# Patient Record
Sex: Male | Born: 1965
Health system: Southern US, Community
[De-identification: ages and names within clinical notes are randomized; demographics above are authoritative.]

## PROBLEM LIST (undated history)

## (undated) DIAGNOSIS — M109 Gout, unspecified: Secondary | ICD-10-CM

## (undated) DIAGNOSIS — H209 Unspecified iridocyclitis: Secondary | ICD-10-CM

## (undated) DIAGNOSIS — N200 Calculus of kidney: Secondary | ICD-10-CM

## (undated) DIAGNOSIS — R1310 Dysphagia, unspecified: Secondary | ICD-10-CM

## (undated) HISTORY — PX: NECK SURGERY: SHX720

## (undated) HISTORY — PX: BACK SURGERY: SHX140

## (undated) HISTORY — DX: Unspecified iridocyclitis: H20.9

## (undated) HISTORY — PX: ROTATOR CUFF REPAIR: SHX139

## (undated) HISTORY — PX: OTHER SURGICAL HISTORY: SHX169

## (undated) HISTORY — DX: Calculus of kidney: N20.0

## (undated) HISTORY — DX: Gout, unspecified: M10.9

## (undated) HISTORY — DX: Dysphagia, unspecified: R13.10

---

## 2002-03-02 ENCOUNTER — Encounter: Payer: Self-pay | Admitting: Internal Medicine

## 2002-03-02 ENCOUNTER — Ambulatory Visit (HOSPITAL_COMMUNITY): Admission: RE | Admit: 2002-03-02 | Discharge: 2002-03-02 | Payer: Self-pay | Admitting: Internal Medicine

## 2002-04-13 ENCOUNTER — Encounter: Admission: RE | Admit: 2002-04-13 | Discharge: 2002-04-13 | Payer: Self-pay | Admitting: Neurosurgery

## 2002-04-13 ENCOUNTER — Encounter: Payer: Self-pay | Admitting: Neurosurgery

## 2002-04-27 ENCOUNTER — Encounter: Payer: Self-pay | Admitting: Neurosurgery

## 2002-04-27 ENCOUNTER — Encounter: Admission: RE | Admit: 2002-04-27 | Discharge: 2002-04-27 | Payer: Self-pay | Admitting: Neurosurgery

## 2002-07-12 ENCOUNTER — Ambulatory Visit (HOSPITAL_COMMUNITY): Admission: RE | Admit: 2002-07-12 | Discharge: 2002-07-12 | Payer: Self-pay | Admitting: Family Medicine

## 2002-07-12 ENCOUNTER — Encounter: Payer: Self-pay | Admitting: Family Medicine

## 2002-08-14 ENCOUNTER — Encounter (HOSPITAL_COMMUNITY): Admission: RE | Admit: 2002-08-14 | Discharge: 2002-09-13 | Payer: Self-pay | Admitting: Orthopedic Surgery

## 2003-02-05 ENCOUNTER — Encounter: Payer: Self-pay | Admitting: Family Medicine

## 2003-02-05 ENCOUNTER — Ambulatory Visit (HOSPITAL_COMMUNITY): Admission: RE | Admit: 2003-02-05 | Discharge: 2003-02-05 | Payer: Self-pay | Admitting: Family Medicine

## 2003-02-23 ENCOUNTER — Encounter: Payer: Self-pay | Admitting: *Deleted

## 2003-02-23 ENCOUNTER — Emergency Department (HOSPITAL_COMMUNITY): Admission: EM | Admit: 2003-02-23 | Discharge: 2003-02-23 | Payer: Self-pay | Admitting: *Deleted

## 2003-05-17 ENCOUNTER — Encounter: Payer: Self-pay | Admitting: *Deleted

## 2003-05-17 ENCOUNTER — Emergency Department (HOSPITAL_COMMUNITY): Admission: EM | Admit: 2003-05-17 | Discharge: 2003-05-17 | Payer: Self-pay | Admitting: *Deleted

## 2003-06-21 ENCOUNTER — Ambulatory Visit (HOSPITAL_COMMUNITY): Admission: RE | Admit: 2003-06-21 | Discharge: 2003-06-21 | Payer: Self-pay | Admitting: Internal Medicine

## 2003-06-21 ENCOUNTER — Encounter: Payer: Self-pay | Admitting: Internal Medicine

## 2004-01-24 ENCOUNTER — Ambulatory Visit (HOSPITAL_COMMUNITY): Admission: RE | Admit: 2004-01-24 | Discharge: 2004-01-24 | Payer: Self-pay | Admitting: Orthopedic Surgery

## 2005-11-08 ENCOUNTER — Emergency Department (HOSPITAL_COMMUNITY): Admission: EM | Admit: 2005-11-08 | Discharge: 2005-11-08 | Payer: Self-pay | Admitting: Emergency Medicine

## 2010-02-23 ENCOUNTER — Emergency Department (HOSPITAL_COMMUNITY): Admission: EM | Admit: 2010-02-23 | Discharge: 2010-02-23 | Payer: Self-pay | Admitting: Emergency Medicine

## 2010-04-13 ENCOUNTER — Emergency Department (HOSPITAL_COMMUNITY): Admission: EM | Admit: 2010-04-13 | Discharge: 2010-04-13 | Payer: Self-pay | Admitting: Emergency Medicine

## 2010-11-19 ENCOUNTER — Emergency Department (HOSPITAL_COMMUNITY)
Admission: EM | Admit: 2010-11-19 | Discharge: 2010-11-19 | Payer: Self-pay | Source: Home / Self Care | Admitting: Emergency Medicine

## 2011-02-03 LAB — URINALYSIS, ROUTINE W REFLEX MICROSCOPIC
Bilirubin Urine: NEGATIVE
Ketones, ur: NEGATIVE mg/dL
Nitrite: NEGATIVE
Protein, ur: NEGATIVE mg/dL
Specific Gravity, Urine: 1.02 (ref 1.005–1.030)
Urobilinogen, UA: 0.2 mg/dL (ref 0.0–1.0)

## 2011-02-03 LAB — URINE MICROSCOPIC-ADD ON

## 2012-12-15 ENCOUNTER — Ambulatory Visit (HOSPITAL_COMMUNITY)
Admission: RE | Admit: 2012-12-15 | Discharge: 2012-12-15 | Disposition: A | Discharge status: Home / Self Care | Payer: 59 | Source: Ambulatory Visit | Attending: Family Medicine | Admitting: Family Medicine

## 2012-12-15 ENCOUNTER — Other Ambulatory Visit (HOSPITAL_COMMUNITY): Payer: Self-pay | Admitting: Family Medicine

## 2012-12-15 DIAGNOSIS — R109 Unspecified abdominal pain: Secondary | ICD-10-CM

## 2012-12-15 DIAGNOSIS — K7689 Other specified diseases of liver: Secondary | ICD-10-CM | POA: Insufficient documentation

## 2012-12-15 MED ORDER — IOHEXOL 300 MG/ML  SOLN
100.0000 mL | Freq: Once | INTRAMUSCULAR | Status: AC | PRN
  Administered 2012-12-15: 100 mL via INTRAVENOUS

## 2013-02-09 ENCOUNTER — Encounter (INDEPENDENT_AMBULATORY_CARE_PROVIDER_SITE_OTHER): Payer: Self-pay | Admitting: *Deleted

## 2013-02-20 ENCOUNTER — Encounter (HOSPITAL_COMMUNITY): Payer: Self-pay | Admitting: Pharmacy Technician

## 2013-02-20 ENCOUNTER — Ambulatory Visit (INDEPENDENT_AMBULATORY_CARE_PROVIDER_SITE_OTHER): Payer: BC Managed Care – PPO | Admitting: Internal Medicine

## 2013-02-20 ENCOUNTER — Encounter (INDEPENDENT_AMBULATORY_CARE_PROVIDER_SITE_OTHER): Payer: Self-pay | Admitting: *Deleted

## 2013-02-20 ENCOUNTER — Telehealth (INDEPENDENT_AMBULATORY_CARE_PROVIDER_SITE_OTHER): Payer: Self-pay | Admitting: *Deleted

## 2013-02-20 ENCOUNTER — Encounter (INDEPENDENT_AMBULATORY_CARE_PROVIDER_SITE_OTHER): Payer: Self-pay | Admitting: Internal Medicine

## 2013-02-20 ENCOUNTER — Other Ambulatory Visit (INDEPENDENT_AMBULATORY_CARE_PROVIDER_SITE_OTHER): Payer: Self-pay | Admitting: *Deleted

## 2013-02-20 VITALS — BP 98/66 | HR 72 | Ht 72.0 in | Wt 236.9 lb

## 2013-02-20 DIAGNOSIS — R109 Unspecified abdominal pain: Secondary | ICD-10-CM

## 2013-02-20 DIAGNOSIS — K5900 Constipation, unspecified: Secondary | ICD-10-CM

## 2013-02-20 DIAGNOSIS — Z1211 Encounter for screening for malignant neoplasm of colon: Secondary | ICD-10-CM

## 2013-02-20 NOTE — Progress Notes (Signed)
Subjective:     Patient ID: Ryan Fuller, male   DOB: 09-05-1966, 47 y.o.   MRN: [id number]  HPI Referred to our office by Dr. Phillips Odor for constipation/colonoscopy. He tells me has had constipation all his life. He also had pain left lower quadrant. Pain x 3 weeks. He has seen a small amount of blood when he wipes x 1. Appetite is good. No weight loss. BMs are brown or green. Normal size. He tells me he is satisfied using the Miralax and stool softners to have a BM.  He had a colonoscopy yrs ago for same at around age 11 for constipation.  12/15/2012 CT abdomen and pelvis with CM: IMPRESSION:  1. No acute inflammatory process within abdomen or pelvis.  2. Mild hepatic fatty infiltration.  3. No hydronephrosis or hydroureter.  4. Moderate colonic stool.  5. There is a low-lying cecum. No pericecal inflammation.  Significant stool noted within cecum.  6. Normal appendix.  Review of Systems Current Outpatient Prescriptions  Medication Sig Dispense Refill  . Casanthranol-Docusate Sodium 30-100 MG CAPS Take by mouth.      Marland Kitchen omeprazole (PRILOSEC) 20 MG capsule Take 20 mg by mouth daily.      . polyethylene glycol (MIRALAX / GLYCOLAX) packet Take 17 g by mouth daily.       No current facility-administered medications for this visit.   Past Surgical History  Procedure Laterality Date  . Rotator cuff surgery    . Egd/ed      years ago     Allergies  Allergen Reactions  . Allopurinol   . Colchicine   . Phenergan (Promethazine Hcl)        Objective:   Physical Exam  Filed Vitals:   02/20/13 1102  BP: 98/66  Pulse: 72  Height: 6' (1.829 m)  Weight: 236 lb 14.4 oz (107.457 kg)   Alert and oriented. Skin warm and dry. Oral mucosa is moist.   . Sclera anicteric, conjunctivae is pink. Thyroid not enlarged. No cervical lymphadenopathy. Lungs clear. Heart regular rate and rhythm.  Abdomen is soft. Bowel sounds are positive. No hepatomegaly. No abdominal masses felt. No tenderness.   No edema to lower extremities.       Assessment:     Constipation./ Left sided abdominal pain.   Ct scan did not reveal any masses.    Plan:    Colonoscopy with Dr. Karilyn Cota.

## 2013-02-20 NOTE — Patient Instructions (Addendum)
Colonoscopy with Dr. Rehman 

## 2013-02-20 NOTE — Telephone Encounter (Signed)
Patient needs movi prep 

## 2013-02-21 DIAGNOSIS — K5900 Constipation, unspecified: Secondary | ICD-10-CM | POA: Insufficient documentation

## 2013-02-21 MED ORDER — PEG-KCL-NACL-NASULF-NA ASC-C 100 G PO SOLR: 1.0000 | Freq: Once | ORAL | Status: DC

## 2013-03-01 ENCOUNTER — Ambulatory Visit (HOSPITAL_COMMUNITY)
Admission: RE | Admit: 2013-03-01 | Discharge: 2013-03-01 | Disposition: A | Discharge status: Home / Self Care | Payer: BC Managed Care – PPO | Source: Ambulatory Visit | Attending: Internal Medicine | Admitting: Internal Medicine

## 2013-03-01 ENCOUNTER — Encounter (HOSPITAL_COMMUNITY)
Admission: RE | Disposition: A | Discharge status: Home / Self Care | Payer: Self-pay | Source: Ambulatory Visit | Attending: Internal Medicine

## 2013-03-01 DIAGNOSIS — M109 Gout, unspecified: Secondary | ICD-10-CM | POA: Insufficient documentation

## 2013-03-01 DIAGNOSIS — R109 Unspecified abdominal pain: Secondary | ICD-10-CM

## 2013-03-01 DIAGNOSIS — K644 Residual hemorrhoidal skin tags: Secondary | ICD-10-CM | POA: Insufficient documentation

## 2013-03-01 DIAGNOSIS — D126 Benign neoplasm of colon, unspecified: Secondary | ICD-10-CM

## 2013-03-01 DIAGNOSIS — Z79899 Other long term (current) drug therapy: Secondary | ICD-10-CM | POA: Insufficient documentation

## 2013-03-01 DIAGNOSIS — K5730 Diverticulosis of large intestine without perforation or abscess without bleeding: Secondary | ICD-10-CM | POA: Insufficient documentation

## 2013-03-01 DIAGNOSIS — K921 Melena: Secondary | ICD-10-CM

## 2013-03-01 DIAGNOSIS — Z888 Allergy status to other drugs, medicaments and biological substances status: Secondary | ICD-10-CM | POA: Insufficient documentation

## 2013-03-01 DIAGNOSIS — K5900 Constipation, unspecified: Secondary | ICD-10-CM

## 2013-03-01 HISTORY — PX: COLONOSCOPY: SHX5424

## 2013-03-01 SURGERY — COLONOSCOPY
Anesthesia: Moderate Sedation

## 2013-03-01 MED ORDER — HYOSCYAMINE SULFATE 0.125 MG SL SUBL: 0.1250 mg | SUBLINGUAL_TABLET | Freq: Four times a day (QID) | SUBLINGUAL | Status: DC | PRN

## 2013-03-01 MED ORDER — MIDAZOLAM HCL 5 MG/5ML IJ SOLN
INTRAMUSCULAR | Status: AC
  Filled 2013-03-01: qty 10

## 2013-03-01 MED ORDER — MEPERIDINE HCL 50 MG/ML IJ SOLN
INTRAMUSCULAR | Status: DC | PRN
  Administered 2013-03-01 (×2): 25 mg via INTRAVENOUS

## 2013-03-01 MED ORDER — MEPERIDINE HCL 50 MG/ML IJ SOLN
INTRAMUSCULAR | Status: AC
  Filled 2013-03-01: qty 1

## 2013-03-01 MED ORDER — SODIUM CHLORIDE 0.9 % IV SOLN
INTRAVENOUS | Status: DC
  Administered 2013-03-01: 14:00:00 via INTRAVENOUS

## 2013-03-01 MED ORDER — PSYLLIUM 28 % PO PACK: 1.0000 | PACK | Freq: Every day | ORAL | Status: DC

## 2013-03-01 MED ORDER — MIDAZOLAM HCL 5 MG/5ML IJ SOLN
INTRAMUSCULAR | Status: DC | PRN
  Administered 2013-03-01: 1 mg via INTRAVENOUS
  Administered 2013-03-01 (×2): 2 mg via INTRAVENOUS
  Administered 2013-03-01 (×2): 1 mg via INTRAVENOUS
  Administered 2013-03-01: 2 mg via INTRAVENOUS

## 2013-03-01 NOTE — H&P (Signed)
Brogan V Franko is an 47 y.o. male.   Chief Complaint: Patient is here for colonoscopy. HPI: Patient is 47 year old Caucasian male who presents with intermittent left-sided abdominal pain and worsening constipation. He has occasional hematochezia. He has good appetite. He denies weight loss. He has been pain free for the last few history. He underwent abdominopelvic CT on 12/15/2012 revealing significant amount of stool in his colon but no other abnormalities were present. Family history is negative for colorectal carcinoma.  Past Medical History  Diagnosis Date  . Gout   . Kidney stones     Past Surgical History  Procedure Laterality Date  . Rotator cuff surgery    . Egd/ed      years ago    No family history on file. Social History:  reports that he has never smoked. He does not have any smokeless tobacco history on file. He reports that he does not drink alcohol or use illicit drugs.  Allergies:  Allergies  Allergen Reactions  . Allopurinol   . Colchicine   . Phenergan (Promethazine Hcl)     Medications Prior to Admission  Medication Sig Dispense Refill  . Casanthranol-Docusate Sodium 30-100 MG CAPS Take by mouth.      Marland Kitchen omeprazole (PRILOSEC) 20 MG capsule Take 20 mg by mouth daily.      . peg 3350 powder (MOVIPREP) 100 G SOLR Take 1 kit (100 g total) by mouth once.  1 kit  0  . polyethylene glycol (MIRALAX / GLYCOLAX) packet Take 17 g by mouth daily.        No results found for this or any previous visit (from the past 48 hour(s)). No results found.  ROS  Temperature 98.1 F (36.7 C), temperature source Oral, height 6' (1.829 m), weight 235 lb (106.595 kg). Physical Exam  Constitutional: He appears well-developed and well-nourished.  HENT:  Mouth/Throat: Oropharynx is clear and moist.  Eyes: Conjunctivae are normal. No scleral icterus.  Neck: No thyromegaly present.  Cardiovascular: Normal rate, regular rhythm and normal heart sounds.   No murmur  heard. Respiratory: Effort normal and breath sounds normal.  GI: Soft. He exhibits no distension and no mass. There is no tenderness.  Musculoskeletal: He exhibits no edema.  Lymphadenopathy:    He has no cervical adenopathy.  Neurological: He is alert.  Skin: Skin is warm and dry.     Assessment/Plan Constipation and left-sided abdominal pain. Diagnostic colonoscopy.  REHMAN,NAJEEB U 03/01/2013, 3:42 PM

## 2013-03-01 NOTE — Op Note (Addendum)
COLONOSCOPY PROCEDURE REPORT  PATIENT:  Ryan Fuller  MR#:  [id number] Birthdate:  15-Jul-1966, 47 y.o., male Endoscopist:  Dr. Malissa Hippo, MD Referred By:  Dr. Colette Ribas, MD  Procedure Date: 03/01/2013  Procedure:   Colonoscopy  Indications:  Patient is 47 year old Caucasian with persistent left-sided abdominal pain. Recent abdominopelvic CT was unremarkable other than significant amount of stool in his colon. He has occasional hematochezia felt to be secondary to hemorrhoids.  Informed Consent:  The procedure and risks were reviewed with the patient and informed consent was obtained.  Medications:  Demerol 50 mg IV Versed 10 mg IV  Description of procedure:  After a digital rectal exam was performed, that colonoscope was advanced from the anus through the rectum and colon to the area of the cecum, ileocecal valve and appendiceal orifice. The cecum was deeply intubated. These structures were well-seen and photographed for the record. From the level of the cecum and ileocecal valve, the scope was slowly and cautiously withdrawn. The mucosal surfaces were carefully surveyed utilizing scope tip to flexion to facilitate fold flattening as needed. The scope was pulled down into the rectum where a thorough exam including retroflexion was performed. Terminal ileum was also examined.  Findings:   Prep excellent. Normal mucosa of terminal ileum. Small polyp ablated via cold biopsy from transverse colon. Another small polyp ablated via cold biopsy from distal sigmoid colon. Both of these polyps were submitted together. Scattered diverticula at sigmoid and descending colon. Small external hemorrhoids. .   Therapeutic/Diagnostic Maneuvers Performed:  See above  Complications:  None  Cecal Withdrawal Time:  16 minutes  Impression:  Normal mucosa of terminal ileum. Mild left-sided diverticulosis. 2 small polyps ablated via cold biopsy and submitted together(transverse and  sigmoid colon). Small external hemorrhoids.  Recommendations:  Standard instructions given. Metamucil 4 g by mouth each bedtime. Discontinue casanthranol/docusate. Hyoscyamine sublingual 1 tablet 4 times a day when necessary. I will contact patient with biopsy results and further recommendations. Office visit in one month.   Shone Leventhal U  03/01/2013 4:23 PM  CC: Dr. Phillips Odor, Chancy Hurter, MD & Dr. Bonnetta Barry ref. provider found

## 2013-03-05 ENCOUNTER — Encounter (HOSPITAL_COMMUNITY): Payer: Self-pay | Admitting: Internal Medicine

## 2013-03-14 ENCOUNTER — Encounter (INDEPENDENT_AMBULATORY_CARE_PROVIDER_SITE_OTHER): Payer: Self-pay | Admitting: *Deleted

## 2013-03-20 NOTE — Progress Notes (Signed)
Apt has been scheduled for 04/23/13 with Dr. Rehman 

## 2013-04-23 ENCOUNTER — Ambulatory Visit (INDEPENDENT_AMBULATORY_CARE_PROVIDER_SITE_OTHER): Payer: BC Managed Care – PPO | Admitting: Internal Medicine

## 2013-06-26 ENCOUNTER — Ambulatory Visit (INDEPENDENT_AMBULATORY_CARE_PROVIDER_SITE_OTHER): Payer: BC Managed Care – PPO | Admitting: Internal Medicine

## 2014-06-12 ENCOUNTER — Other Ambulatory Visit (HOSPITAL_COMMUNITY): Payer: Self-pay | Admitting: Physician Assistant

## 2014-06-12 DIAGNOSIS — S335XXA Sprain of ligaments of lumbar spine, initial encounter: Secondary | ICD-10-CM

## 2014-06-14 ENCOUNTER — Ambulatory Visit (HOSPITAL_COMMUNITY)
Admission: RE | Admit: 2014-06-14 | Discharge: 2014-06-14 | Disposition: A | Discharge status: Home / Self Care | Payer: BC Managed Care – PPO | Source: Ambulatory Visit | Attending: Physician Assistant | Admitting: Physician Assistant

## 2014-06-14 DIAGNOSIS — S335XXA Sprain of ligaments of lumbar spine, initial encounter: Secondary | ICD-10-CM

## 2014-06-14 DIAGNOSIS — Y999 Unspecified external cause status: Secondary | ICD-10-CM | POA: Insufficient documentation

## 2014-06-14 DIAGNOSIS — X58XXXA Exposure to other specified factors, initial encounter: Secondary | ICD-10-CM | POA: Insufficient documentation

## 2014-06-14 DIAGNOSIS — Y929 Unspecified place or not applicable: Secondary | ICD-10-CM | POA: Insufficient documentation

## 2014-09-24 ENCOUNTER — Other Ambulatory Visit: Payer: Self-pay | Admitting: Neurosurgery

## 2014-09-24 DIAGNOSIS — M4646 Discitis, unspecified, lumbar region: Secondary | ICD-10-CM

## 2014-09-24 DIAGNOSIS — M5126 Other intervertebral disc displacement, lumbar region: Secondary | ICD-10-CM

## 2014-09-25 ENCOUNTER — Other Ambulatory Visit: Payer: Self-pay | Admitting: Neurosurgery

## 2014-09-25 ENCOUNTER — Ambulatory Visit
Admission: RE | Admit: 2014-09-25 | Discharge: 2014-09-25 | Disposition: A | Discharge status: Home / Self Care | Payer: BC Managed Care – PPO | Source: Ambulatory Visit | Attending: Neurosurgery | Admitting: Neurosurgery

## 2014-09-25 DIAGNOSIS — M5126 Other intervertebral disc displacement, lumbar region: Secondary | ICD-10-CM

## 2014-09-25 DIAGNOSIS — M4646 Discitis, unspecified, lumbar region: Secondary | ICD-10-CM

## 2014-09-25 LAB — CSF CULTURE W GRAM STAIN

## 2014-09-25 LAB — CSF CULTURE

## 2014-09-25 MED ORDER — MIDAZOLAM HCL 2 MG/2ML IJ SOLN
1.0000 mg | INTRAMUSCULAR | Status: DC | PRN
  Administered 2014-09-25: 2 mg via INTRAVENOUS
  Administered 2014-09-25 (×2): 1 mg via INTRAVENOUS

## 2014-09-25 MED ORDER — KETOROLAC TROMETHAMINE 30 MG/ML IJ SOLN
30.0000 mg | Freq: Once | INTRAMUSCULAR | Status: AC
  Administered 2014-09-25: 30 mg via INTRAVENOUS

## 2014-09-25 MED ORDER — FENTANYL CITRATE 0.05 MG/ML IJ SOLN
25.0000 ug | INTRAMUSCULAR | Status: DC | PRN
  Administered 2014-09-25 (×3): 50 ug via INTRAVENOUS
  Administered 2014-09-25: 25 ug via INTRAVENOUS

## 2014-09-25 MED ORDER — SODIUM CHLORIDE 0.9 % IV SOLN
Freq: Once | INTRAVENOUS | Status: AC
  Administered 2014-09-25: 08:00:00 via INTRAVENOUS

## 2014-09-25 NOTE — Discharge Instructions (Signed)
Disc Aspiration Post Procedure Discharge Instructions ° °1. May resume a regular diet and any medications that you routinely take (including pain medications). °2. No driving day of procedure. °3. Upon discharge go home and rest for at least 4 hours.  May use an ice pack as needed to injection sites on back. °4. Remove bandades later, today. ° ° ° °Please contact our office at [phone] for the following symptoms: ° °· Fever greater than 100 degrees °· Increased swelling, pain, or redness at injection site. ° ° °Thank you for visiting [facility]. ° °  ° ° ° °    °

## 2014-09-26 ENCOUNTER — Ambulatory Visit (HOSPITAL_COMMUNITY)
Admission: RE | Admit: 2014-09-26 | Discharge: 2014-09-26 | Disposition: A | Discharge status: Home / Self Care | Payer: BC Managed Care – PPO | Source: Ambulatory Visit | Attending: Neurosurgery | Admitting: Neurosurgery

## 2014-09-26 DIAGNOSIS — M4640 Discitis, unspecified, site unspecified: Secondary | ICD-10-CM | POA: Insufficient documentation

## 2014-09-26 MED ORDER — SODIUM CHLORIDE 0.9 % IJ SOLN: 10.0000 mL | Freq: Two times a day (BID) | INTRAMUSCULAR | Status: DC

## 2014-09-26 MED ORDER — SODIUM CHLORIDE 0.9 % IJ SOLN: 10.0000 mL | INTRAMUSCULAR | Status: DC | PRN

## 2014-09-26 NOTE — Discharge Instructions (Signed)
PICC Home Guide A peripherally inserted central catheter (PICC) is a long, thin, flexible tube that is inserted into a vein in the upper arm. It is a form of intravenous (IV) access. It is considered to be a "central" line because the tip of the PICC ends in a large vein in your chest. This large vein is called the superior vena cava (SVC). The PICC tip ends in the SVC because there is a lot of blood flow in the SVC. This allows medicines and IV fluids to be quickly distributed throughout the body. The PICC is inserted using a sterile technique by a specially trained nurse or physician. After the PICC is inserted, a chest X-ray exam is done to be sure it is in the correct place.  A PICC may be placed for different reasons, such as:  To give medicines and liquid nutrition that can only be given through a central line. Examples are:  Certain antibiotic treatments.  Chemotherapy.  Total parenteral nutrition (TPN).  To take frequent blood samples.  To give IV fluids and blood products.  If there is difficulty placing a peripheral intravenous (PIV) catheter. If taken care of properly, a PICC can remain in place for several months. A PICC can also allow a person to go home from the hospital early. Medicine and PICC care can be managed at home by a family member or home health care team. WHAT PROBLEMS CAN HAPPEN WHEN I HAVE A PICC? Problems with a PICC can occasionally occur. These may include the following:  A blood clot (thrombus) forming in or at the tip of the PICC. This can cause the PICC to become clogged. A clot-dissolving medicine called tissue plasminogen activator (tPA) can be given through the PICC to help break up the clot.  Inflammation of the vein (phlebitis) in which the PICC is placed. Signs of inflammation may include redness, pain at the insertion site, red streaks, or being able to feel a "cord" in the vein where the PICC is located.  Infection in the PICC or at the insertion  site. Signs of infection may include fever, chills, redness, swelling, or pus drainage from the PICC insertion site.  PICC movement (malposition). The PICC tip may move from its original position due to excessive physical activity, forceful coughing, sneezing, or vomiting.  A break or cut in the PICC. It is important to not use scissors near the PICC.  Nerve or tendon irritation or injury during PICC insertion. WHAT SHOULD I KEEP IN MIND ABOUT ACTIVITIES WHEN I HAVE A PICC?  You may bend your arm and move it freely. If your PICC is near or at the bend of your elbow, avoid activity with repeated motion at the elbow.  Rest at home for the remainder of the day following PICC line insertion.  Avoid lifting heavy objects as instructed by your health care provider.  Avoid using a crutch with the arm on the same side as your PICC. You may need to use a walker. WHAT SHOULD I KNOW ABOUT MY PICC DRESSING?  Keep your PICC bandage (dressing) clean and dry to prevent infection.  Ask your health care provider when you may shower. Ask your health care provider to teach you how to wrap the PICC when you do take a shower.  Change the PICC dressing as instructed by your health care provider.  Change your PICC dressing if it becomes loose or wet. WHAT SHOULD I KNOW ABOUT PICC CARE?  Check the PICC insertion site   daily for leakage, redness, swelling, or pain.  Do not take a bath, swim, or use hot tubs when you have a PICC. Cover PICC line with clear plastic wrap and tape to keep it dry while showering.  Flush the PICC as directed by your health care provider. Let your health care provider know right away if the PICC is difficult to flush or does not flush. Do not use force to flush the PICC.  Do not use a syringe that is less than 10 mL to flush the PICC.  Never pull or tug on the PICC.  Avoid blood pressure checks on the arm with the PICC.  Keep your PICC identification card with you at all  times.  Do not take the PICC out yourself. Only a trained clinical professional should remove the PICC. SEEK IMMEDIATE MEDICAL CARE IF:  Your PICC is accidentally pulled all the way out. If this happens, cover the insertion site with a bandage or gauze dressing. Do not throw the PICC away. Your health care provider will need to inspect it.  Your PICC was tugged or pulled and has partially come out. Do not  push the PICC back in.  There is any type of drainage, redness, or swelling where the PICC enters the skin.  You cannot flush the PICC, it is difficult to flush, or the PICC leaks around the insertion site when it is flushed.  You hear a "flushing" sound when the PICC is flushed.  You have pain, discomfort, or numbness in your arm, shoulder, or jaw on the same side as the PICC.  You feel your heart "racing" or skipping beats.  You notice a hole or tear in the PICC.  You develop chills or a fever. MAKE SURE YOU:   Understand these instructions.  Will watch your condition.  Will get help right away if you are not doing well or get worse. Document Released: 05/08/2003 Document Revised: 03/18/2014 Document Reviewed: 07/09/2013 Exit[employer] Patient Information 2015 [employer]. This information is not intended to replace advice given to you by your health care provider. Make sure you discuss any questions you have with your health care provider.  

## 2014-09-26 NOTE — Progress Notes (Signed)
Peripherally Inserted Central Catheter/Midline Placement  The IV Nurse has discussed with the patient and/or persons authorized to consent for the patient, the purpose of this procedure and the potential benefits and risks involved with this procedure.  The benefits include less needle sticks, lab draws from the catheter and patient may be discharged home with the catheter.  Risks include, but not limited to, infection, bleeding, blood clot (thrombus formation), and puncture of an artery; nerve damage and irregular heat beat.  Alternatives to this procedure were also discussed.  PICC/Midline Placement Documentation  PICC / Midline Single Lumen 09/26/14 PICC Right Brachial 43 cm 2 cm (Active)  Indication for Insertion or Continuance of Line Prolonged intravenous therapies;Home intravenous therapies (PICC only) 09/26/2014 11:30 AM  Exposed Catheter (cm) 2 cm 09/26/2014 11:30 AM  Site Assessment Clean;Dry;Intact 09/26/2014 11:30 AM  Line Status Flushed;Saline locked;Capped (central line);Blood return noted 09/26/2014 11:30 AM  Dressing Type Transparent 09/26/2014 11:30 AM  Dressing Status Clean;Dry;Intact 09/26/2014 11:30 AM    Ok to use PICC line. ECG technology used verifying placement. Copy of ECG being sent with patient.  Roselind Messier 09/26/2014, 11:44 AM

## 2015-12-16 ENCOUNTER — Other Ambulatory Visit: Payer: Self-pay | Admitting: Neurosurgery

## 2015-12-16 DIAGNOSIS — M5126 Other intervertebral disc displacement, lumbar region: Secondary | ICD-10-CM

## 2015-12-17 ENCOUNTER — Emergency Department (HOSPITAL_COMMUNITY)
Admission: EM | Admit: 2015-12-17 | Discharge: 2015-12-17 | Disposition: A | Discharge status: Home / Self Care | Payer: BLUE CROSS/BLUE SHIELD | Attending: Emergency Medicine | Admitting: Emergency Medicine

## 2015-12-17 ENCOUNTER — Encounter (HOSPITAL_COMMUNITY): Payer: Self-pay | Admitting: Emergency Medicine

## 2015-12-17 DIAGNOSIS — R197 Diarrhea, unspecified: Secondary | ICD-10-CM | POA: Insufficient documentation

## 2015-12-17 DIAGNOSIS — R112 Nausea with vomiting, unspecified: Secondary | ICD-10-CM | POA: Insufficient documentation

## 2015-12-17 DIAGNOSIS — Z880 Allergy status to penicillin: Secondary | ICD-10-CM | POA: Diagnosis not present

## 2015-12-17 DIAGNOSIS — F17210 Nicotine dependence, cigarettes, uncomplicated: Secondary | ICD-10-CM | POA: Insufficient documentation

## 2015-12-17 DIAGNOSIS — Z87442 Personal history of urinary calculi: Secondary | ICD-10-CM | POA: Diagnosis not present

## 2015-12-17 DIAGNOSIS — Z8739 Personal history of other diseases of the musculoskeletal system and connective tissue: Secondary | ICD-10-CM | POA: Diagnosis not present

## 2015-12-17 LAB — URINALYSIS, ROUTINE W REFLEX MICROSCOPIC
BILIRUBIN URINE: NEGATIVE
Glucose, UA: NEGATIVE mg/dL
Hgb urine dipstick: NEGATIVE
KETONES UR: NEGATIVE mg/dL
LEUKOCYTES UA: NEGATIVE
NITRITE: NEGATIVE
Protein, ur: 30 mg/dL — AB
SPECIFIC GRAVITY, URINE: 1.025 (ref 1.005–1.030)
pH: 5.5 (ref 5.0–8.0)

## 2015-12-17 LAB — COMPREHENSIVE METABOLIC PANEL
ALBUMIN: 4.1 g/dL (ref 3.5–5.0)
ALT: 25 U/L (ref 17–63)
ANION GAP: 11 (ref 5–15)
AST: 27 U/L (ref 15–41)
Alkaline Phosphatase: 67 U/L (ref 38–126)
BILIRUBIN TOTAL: 1.2 mg/dL (ref 0.3–1.2)
BUN: 14 mg/dL (ref 6–20)
CALCIUM: 9 mg/dL (ref 8.9–10.3)
CHLORIDE: 102 mmol/L (ref 101–111)
CO2: 23 mmol/L (ref 22–32)
Creatinine, Ser: 0.97 mg/dL (ref 0.61–1.24)
GFR calc Af Amer: >60 mL/min (ref >60)
GFR calc non Af Amer: >60 mL/min (ref >60)
GLUCOSE: 138 mg/dL — AB (ref 65–99)
POTASSIUM: 3.5 mmol/L (ref 3.5–5.1)
SODIUM: 136 mmol/L (ref 135–145)
TOTAL PROTEIN: 7.7 g/dL (ref 6.5–8.1)

## 2015-12-17 LAB — CBC
HCT: 43.7 % (ref 39.0–52.0)
HEMOGLOBIN: 15.3 g/dL (ref 13.0–17.0)
MCH: 29.9 pg (ref 26.0–34.0)
MCHC: 35 g/dL (ref 30.0–36.0)
MCV: 85.5 fL (ref 78.0–100.0)
Platelets: 127 10*3/uL — ABNORMAL LOW (ref 150–400)
RBC: 5.11 MIL/uL (ref 4.22–5.81)
RDW: 14 % (ref 11.5–15.5)
WBC: 5.5 10*3/uL (ref 4.0–10.5)

## 2015-12-17 LAB — LIPASE, BLOOD: LIPASE: 17 U/L (ref 11–51)

## 2015-12-17 LAB — URINE MICROSCOPIC-ADD ON

## 2015-12-17 MED ORDER — ONDANSETRON 4 MG PO TBDP: 4.0000 mg | ORAL_TABLET | Freq: Three times a day (TID) | ORAL | Status: DC | PRN

## 2015-12-17 MED ORDER — ONDANSETRON HCL 4 MG/2ML IJ SOLN
4.0000 mg | Freq: Once | INTRAMUSCULAR | Status: AC
  Administered 2015-12-17: 4 mg via INTRAMUSCULAR
  Filled 2015-12-17: qty 2

## 2015-12-17 MED ORDER — SODIUM CHLORIDE 0.9 % IV SOLN
Freq: Once | INTRAVENOUS | Status: AC
  Administered 2015-12-17: 09:00:00 via INTRAVENOUS

## 2015-12-17 MED ORDER — SODIUM CHLORIDE 0.9 % IV BOLUS (SEPSIS)
1000.0000 mL | Freq: Once | INTRAVENOUS | Status: AC
  Administered 2015-12-17: 1000 mL via INTRAVENOUS

## 2015-12-17 MED ORDER — ONDANSETRON HCL 4 MG/2ML IJ SOLN
4.0000 mg | INTRAMUSCULAR | Status: AC
  Administered 2015-12-17: 4 mg via INTRAVENOUS
  Filled 2015-12-17: qty 2

## 2015-12-17 MED ORDER — METOCLOPRAMIDE HCL 5 MG/ML IJ SOLN
10.0000 mg | INTRAMUSCULAR | Status: AC
  Administered 2015-12-17: 10 mg via INTRAVENOUS
  Filled 2015-12-17: qty 2

## 2015-12-17 NOTE — ED Notes (Signed)
Attempted orthostatic vs. Pt increasing n/v upon sitting.

## 2015-12-17 NOTE — ED Notes (Signed)
Pt reports n/v/d and fever since Sunday night. Pt reports he is unable to tolerate any thing PO.

## 2015-12-17 NOTE — Discharge Instructions (Signed)
Take the prescribed medication as directed.  You may continue taking Imodium if needed for diarrhea. Recommend to start with gentle diet and progress back to normal as tolerated. Use caution when eating meat or dairy products as these tend to sit heavily on the stomach and may worsen her symptoms. Follow-up with your primary care physician as needed. Return to the ED for new or worsening symptoms.

## 2015-12-17 NOTE — ED Provider Notes (Signed)
CSN: [id number]     Arrival date & time 12/17/15  [id number] History   First MD Initiated Contact with Patient 12/17/15 0840     Chief Complaint  Patient presents with  . Emesis  . Diarrhea     (Consider location/radiation/quality/duration/timing/severity/associated sxs/prior Treatment) Patient is a 50 y.o. male presenting with vomiting and diarrhea. The history is provided by the patient and medical records.  Emesis Associated symptoms: diarrhea   Diarrhea Associated symptoms: vomiting     50 year old male with history of gout and kidney stones, presenting to the ED for nausea, vomiting, and diarrhea since Sunday evening. His wife has recently been sick with similar symptoms, most of hers resolved by yesterday afternoon however patient's symptoms worsened. He states he been able to tolerate very little food or fluids over the past few days. He states he has had a low-grade fever at home. He has been taking Motrin and Imodium without significant relief of symptoms.  Patient denies any abdominal pain. No chest pain or shortness of breath. No hx of underlying GI issues aside form some intermittent issues with constipation-- takes metamucil for this which seems to regulate bowel movements.  Vital signs stable.  Past Medical History  Diagnosis Date  . Gout   . Kidney stones    Past Surgical History  Procedure Laterality Date  . Rotator cuff surgery    . Egd/ed      years ago  . Colonoscopy N/A 03/01/2013    Procedure: COLONOSCOPY;  Surgeon: Rogene Houston, MD;  Location: [facility];  Service: Endoscopy;  Laterality: N/A;  100  . Back surgery     No family history on file. Social History  Substance Use Topics  . Smoking status: Never Smoker   . Smokeless tobacco: None  . Alcohol Use: No    Review of Systems  Gastrointestinal: Positive for nausea, vomiting and diarrhea.  All other systems reviewed and are negative.     Allergies  Phenergan; Allopurinol; Penicillins; and  Colchicine  Home Medications   Prior to Admission medications   Medication Sig Start Date End Date Taking? Authorizing Provider  hyoscyamine (LEVSIN/SL) 0.125 MG SL tablet Place 1 tablet (0.125 mg total) under the tongue every 6 (six) hours as needed for cramping. 03/01/13   Rogene Houston, MD  omeprazole (PRILOSEC) 20 MG capsule Take 20 mg by mouth daily.    Historical Provider, MD  polyethylene glycol (MIRALAX / GLYCOLAX) packet Take 17 g by mouth daily.    Historical Provider, MD  psyllium (METAMUCIL SMOOTH TEXTURE) 28 % packet Take 1 packet by mouth at bedtime. 03/01/13   Rogene Houston, MD   BP 113/79 mmHg  Pulse 83  Temp(Src) 97.6 F (36.4 C) (Oral)  Resp 16  Ht 6' (1.829 m)  Wt 104.327 kg  BMI 31.19 kg/m2  SpO2 96%   Physical Exam  Constitutional: He is oriented to person, place, and time. He appears well-developed and well-nourished. No distress.  HENT:  Head: Normocephalic and atraumatic.  Mouth/Throat: Uvula is midline and oropharynx is clear and moist. Mucous membranes are dry. No oropharyngeal exudate, posterior oropharyngeal edema, posterior oropharyngeal erythema or tonsillar abscesses.  Dry mucous membranes  Eyes: Conjunctivae and EOM are normal. Pupils are equal, round, and reactive to light.  Neck: Normal range of motion. Neck supple.  Cardiovascular: Normal rate, regular rhythm and normal heart sounds.   Pulmonary/Chest: Effort normal and breath sounds normal. No respiratory distress. He has no wheezes.  Abdominal: Soft.  Bowel sounds are normal. There is no tenderness. There is no guarding.  Musculoskeletal: Normal range of motion. He exhibits no edema.  Neurological: He is alert and oriented to person, place, and time.  Skin: Skin is warm and dry. He is not diaphoretic.  Psychiatric: He has a normal mood and affect.  Nursing note and vitals reviewed.   ED Course  Procedures (including critical care time) Labs Review Labs Reviewed  CBC - Abnormal; Notable  for the following:    Platelets 127 (*)    All other components within normal limits  LIPASE, BLOOD  COMPREHENSIVE METABOLIC PANEL  URINALYSIS, ROUTINE W REFLEX MICROSCOPIC (NOT AT [facility])    Imaging Review No results found. I have personally reviewed and evaluated these images and lab results as part of my medical decision-making.   EKG Interpretation None      MDM   Final diagnoses:  Nausea vomiting and diarrhea   50 year old male here with nausea, vomiting, and diarrhea for 3 days. Wife recently sick with similar symptoms. Patient is afebrile, nontoxic.  His abdominal exam is benign, however mucous membranes are dry. Labs pending. Patient given IV fluids, Zofran. He has already taken Imodium this morning.  10:01 AM Lab work reassuring.  After initial fluid bolus and zofran patient reports some improvement.  Continues to have nausea.  No further vomiting or diarrhea.  He has tolerated a few ice chips.  Will give additional fluids and reglan.  After additional liter of fluids and Reglan patient reports he is feeling much better. He has remained stable in the emergency department without any active vomiting or diarrhea. He continues tolerating ice chips well. Patient did have some recurrence of nausea once sitting upright in the bed, however this again resolved with Zofran. VS remain stable.  Patient appears stable for discharge. I suspect his symptoms are related to viral gastroenteritis. Patient discharged home with Zofran, may continue Imodium as needed. Recommended gentle diet and progress back to normal as tolerated. Follow-up with PCP.  Discussed plan with patient, he/she acknowledged understanding and agreed with plan of care.  Return precautions given for new or worsening symptoms.  Larene Pickett, PA-C 12/17/15 [place], MD 12/19/15 [phone]

## 2015-12-23 ENCOUNTER — Ambulatory Visit
Admission: RE | Admit: 2015-12-23 | Discharge: 2015-12-23 | Disposition: A | Discharge status: Home / Self Care | Payer: BLUE CROSS/BLUE SHIELD | Source: Ambulatory Visit | Attending: Neurosurgery | Admitting: Neurosurgery

## 2015-12-23 DIAGNOSIS — M5126 Other intervertebral disc displacement, lumbar region: Secondary | ICD-10-CM

## 2015-12-23 MED ORDER — IOHEXOL 300 MG/ML  SOLN
1.0000 mL | Freq: Once | INTRAMUSCULAR | Status: AC | PRN
  Administered 2015-12-23: 1 mL via EPIDURAL

## 2015-12-23 MED ORDER — TRIAMCINOLONE ACETONIDE 40 MG/ML IJ SUSP (RADIOLOGY)
60.0000 mg | Freq: Once | INTRAMUSCULAR | Status: AC
  Administered 2015-12-23: 60 mg via EPIDURAL

## 2015-12-23 NOTE — Discharge Instructions (Signed)

## 2016-05-27 ENCOUNTER — Emergency Department (HOSPITAL_COMMUNITY): Payer: BLUE CROSS/BLUE SHIELD

## 2016-05-27 ENCOUNTER — Encounter (HOSPITAL_COMMUNITY): Payer: Self-pay

## 2016-05-27 ENCOUNTER — Emergency Department (HOSPITAL_COMMUNITY)
Admission: EM | Admit: 2016-05-27 | Discharge: 2016-05-27 | Disposition: A | Discharge status: Home / Self Care | Payer: BLUE CROSS/BLUE SHIELD | Attending: Emergency Medicine | Admitting: Emergency Medicine

## 2016-05-27 DIAGNOSIS — R52 Pain, unspecified: Secondary | ICD-10-CM

## 2016-05-27 DIAGNOSIS — K8020 Calculus of gallbladder without cholecystitis without obstruction: Secondary | ICD-10-CM | POA: Diagnosis not present

## 2016-05-27 DIAGNOSIS — R072 Precordial pain: Secondary | ICD-10-CM | POA: Insufficient documentation

## 2016-05-27 DIAGNOSIS — Z79899 Other long term (current) drug therapy: Secondary | ICD-10-CM | POA: Diagnosis not present

## 2016-05-27 DIAGNOSIS — R1013 Epigastric pain: Secondary | ICD-10-CM | POA: Diagnosis present

## 2016-05-27 LAB — TROPONIN I
Troponin I: <0.03 ng/mL (ref <0.03)
Troponin I: <0.03 ng/mL (ref <0.03)

## 2016-05-27 LAB — CBC WITH DIFFERENTIAL/PLATELET
Basophils Absolute: 0 10*3/uL (ref 0.0–0.1)
Basophils Relative: 1 %
EOS ABS: 0.1 10*3/uL (ref 0.0–0.7)
EOS PCT: 2 %
HCT: 43.4 % (ref 39.0–52.0)
Hemoglobin: 14.8 g/dL (ref 13.0–17.0)
LYMPHS ABS: 2.1 10*3/uL (ref 0.7–4.0)
LYMPHS PCT: 36 %
MCH: 30.3 pg (ref 26.0–34.0)
MCHC: 34.1 g/dL (ref 30.0–36.0)
MCV: 88.8 fL (ref 78.0–100.0)
MONOS PCT: 9 %
Monocytes Absolute: 0.5 10*3/uL (ref 0.1–1.0)
Neutro Abs: 3.1 10*3/uL (ref 1.7–7.7)
Neutrophils Relative %: 53 %
PLATELETS: 187 10*3/uL (ref 150–400)
RBC: 4.89 MIL/uL (ref 4.22–5.81)
RDW: 13.3 % (ref 11.5–15.5)
WBC: 5.8 10*3/uL (ref 4.0–10.5)

## 2016-05-27 LAB — HEPATIC FUNCTION PANEL
ALT: 29 U/L (ref 17–63)
AST: 22 U/L (ref 15–41)
Albumin: 4.4 g/dL (ref 3.5–5.0)
Alkaline Phosphatase: 70 U/L (ref 38–126)
BILIRUBIN DIRECT: 0.1 mg/dL (ref 0.1–0.5)
BILIRUBIN INDIRECT: 0.3 mg/dL (ref 0.3–0.9)
BILIRUBIN TOTAL: 0.4 mg/dL (ref 0.3–1.2)
Total Protein: 7.4 g/dL (ref 6.5–8.1)

## 2016-05-27 LAB — BASIC METABOLIC PANEL
Anion gap: 7 (ref 5–15)
BUN: 9 mg/dL (ref 6–20)
CHLORIDE: 105 mmol/L (ref 101–111)
CO2: 26 mmol/L (ref 22–32)
CREATININE: 0.87 mg/dL (ref 0.61–1.24)
Calcium: 8.9 mg/dL (ref 8.9–10.3)
GFR calc Af Amer: >60 mL/min (ref >60)
GFR calc non Af Amer: >60 mL/min (ref >60)
GLUCOSE: 127 mg/dL — AB (ref 65–99)
POTASSIUM: 3.5 mmol/L (ref 3.5–5.1)
Sodium: 138 mmol/L (ref 135–145)

## 2016-05-27 LAB — LIPASE, BLOOD: Lipase: 21 U/L (ref 11–51)

## 2016-05-27 MED ORDER — ONDANSETRON HCL 4 MG/2ML IJ SOLN
4.0000 mg | Freq: Once | INTRAMUSCULAR | Status: AC
  Administered 2016-05-27: 4 mg via INTRAVENOUS
  Filled 2016-05-27: qty 2

## 2016-05-27 MED ORDER — NITROGLYCERIN 2 % TD OINT
1.0000 [in_us] | TOPICAL_OINTMENT | Freq: Once | TRANSDERMAL | Status: AC
  Administered 2016-05-27: 1 [in_us] via TOPICAL
  Filled 2016-05-27: qty 1

## 2016-05-27 MED ORDER — MORPHINE SULFATE (PF) 4 MG/ML IV SOLN
4.0000 mg | Freq: Once | INTRAVENOUS | Status: AC
  Administered 2016-05-27: 4 mg via INTRAVENOUS
  Filled 2016-05-27: qty 1

## 2016-05-27 MED ORDER — IOPAMIDOL (ISOVUE-370) INJECTION 76%
100.0000 mL | Freq: Once | INTRAVENOUS | Status: AC | PRN
  Administered 2016-05-27: 100 mL via INTRAVENOUS

## 2016-05-27 MED ORDER — ASPIRIN 325 MG PO TABS
325.0000 mg | ORAL_TABLET | Freq: Once | ORAL | Status: AC
  Administered 2016-05-27: 325 mg via ORAL
  Filled 2016-05-27: qty 1

## 2016-05-27 MED ORDER — ONDANSETRON HCL 4 MG/2ML IJ SOLN
INTRAMUSCULAR | Status: AC
  Filled 2016-05-27: qty 2

## 2016-05-27 MED ORDER — DEXTROSE 5 % IV SOLN
1.0000 g | Freq: Once | INTRAVENOUS | Status: AC
  Administered 2016-05-27: 1 g via INTRAVENOUS
  Filled 2016-05-27: qty 10

## 2016-05-27 MED ORDER — ONDANSETRON HCL 4 MG/2ML IJ SOLN
4.0000 mg | Freq: Once | INTRAMUSCULAR | Status: AC
  Administered 2016-05-27: 4 mg via INTRAVENOUS

## 2016-05-27 NOTE — ED Notes (Signed)
Patient with no complaints at this time. Respirations even and unlabored. Skin warm/dry. Discharge instructions reviewed with patient at this time. Patient given opportunity to voice concerns/ask questions. IV removed per policy and band-aid applied to site. Patient discharged at this time and left Emergency Department with steady gait.  

## 2016-05-27 NOTE — ED Provider Notes (Signed)
CSN: [id number]     Arrival date & time 05/27/16  [id number] History   First MD Initiated Contact with Patient 05/27/16 0631     Chief Complaint  Patient presents with  . Chest Pain     (Consider location/radiation/quality/duration/timing/severity/associated sxs/prior Treatment) HPI  This is a 50 year old male who presents with chest and epigastric pain. He reports onset of symptoms at 4 AM. He reports sharp pain that is nonradiating. It is constant. Denies diaphoresis or shortness of breath. Does endorse nausea. Denies any vomiting, constipation, diarrhea. He has never had pain like this before. Currently his pain is 8 out of 10. Denies history of hypertension, hyperlipidemia, smoking, early family history of heart disease. Denies recent NSAID use or daily alcohol use.  Past Medical History  Diagnosis Date  . Gout   . Kidney stones    Past Surgical History  Procedure Laterality Date  . Rotator cuff surgery    . Egd/ed      years ago  . Colonoscopy N/A 03/01/2013    Procedure: COLONOSCOPY;  Surgeon: Rogene Houston, MD;  Location: [facility];  Service: Endoscopy;  Laterality: N/A;  100  . Back surgery     No family history on file. Social History  Substance Use Topics  . Smoking status: Never Smoker   . Smokeless tobacco: None  . Alcohol Use: No    Review of Systems  Constitutional: Negative for fever.  Respiratory: Negative for shortness of breath.   Cardiovascular: Positive for chest pain.  Gastrointestinal: Positive for nausea and abdominal pain. Negative for vomiting.  All other systems reviewed and are negative.     Allergies  Phenergan; Allopurinol; Colchicine; and Penicillins  Home Medications   Prior to Admission medications   Medication Sig Start Date End Date Taking? Authorizing Provider  HYDROcodone-acetaminophen (NORCO/VICODIN) 5-325 MG tablet Take 1 tablet by mouth at bedtime.   Yes Historical Provider, MD  omeprazole (PRILOSEC) 20 MG capsule Take 20 mg  by mouth daily.   Yes Historical Provider, MD  ondansetron (ZOFRAN ODT) 4 MG disintegrating tablet Take 1 tablet (4 mg total) by mouth every 8 (eight) hours as needed for nausea. 12/17/15   Larene Pickett, PA-C   BP 160/94 mmHg  Pulse 58  Temp(Src) 97.6 F (36.4 C) (Oral)  Resp 15  Ht 6' (1.829 m)  Wt 222 lb (100.699 kg)  BMI 30.10 kg/m2  SpO2 100% Physical Exam  Constitutional: He is oriented to person, place, and time. He appears well-developed and well-nourished. No distress.  HENT:  Head: Normocephalic and atraumatic.  Cardiovascular: Normal rate, regular rhythm and normal heart sounds.   No murmur heard. Pulmonary/Chest: Effort normal and breath sounds normal. No respiratory distress. He has no wheezes. He exhibits no tenderness.  Abdominal: Soft. Bowel sounds are normal. There is no tenderness. There is no rebound and no guarding.  Musculoskeletal: He exhibits no edema.  Neurological: He is alert and oriented to person, place, and time.  Skin: Skin is warm and dry.  Psychiatric: He has a normal mood and affect.  Nursing note and vitals reviewed.   ED Course  Procedures (including critical care time) Labs Review Labs Reviewed  CBC WITH DIFFERENTIAL/PLATELET  BASIC METABOLIC PANEL  TROPONIN I  HEPATIC FUNCTION PANEL  LIPASE, BLOOD    Imaging Review No results found. I have personally reviewed and evaluated these images and lab results as part of my medical decision-making.   EKG Interpretation   Date/Time:  Thursday May 27 2016 06:29:23 EDT Ventricular Rate:  53 PR Interval:    QRS Duration: 109 QT Interval:  424 QTC Calculation: 398 R Axis:   67 Text Interpretation:  Sinus rhythm No prior for comparison Confirmed by  HORTON  MD, COURTNEY ([id number]) on 05/27/2016 6:43:15 AM      MDM   Final diagnoses:  None    Patient presents with chest pain/epigastric pain. Onset this morning. No significant risk factors for ACS. Pain is somewhat atypical; however there  is no reproducible abdominal tenderness on exam. EKG is normal without ischemic changes. Patient was given morphine and nitroglycerin. Lab work is pending as well as chest x-ray. Patient signed out to oncoming physician.  Merryl Hacker, MD 05/27/16 [phone]

## 2016-05-27 NOTE — Discharge Instructions (Signed)
Biliary Colic °Biliary colic is a pain in the upper abdomen. The pain: °· Is usually felt on the right side of the abdomen, but it may also be felt in the center of the abdomen, just below the breastbone (sternum). °· May spread back toward the right shoulder blade. °· May be steady or irregular. °· May be accompanied by nausea and vomiting. °Most of the time, the pain goes away in 1-5 hours. After the most intense pain passes, the abdomen may continue to ache mildly for about 24 hours. °Biliary colic is caused by a blockage in the bile duct. The bile duct is a pathway that carries bile--a liquid that helps to digest fats--from the gallbladder to the small intestine. Biliary colic usually occurs after eating, when the digestive system demands bile. The pain develops when muscle cells contract forcefully to try to move the blockage so that bile can get by. °HOME CARE INSTRUCTIONS °· Take medicines only as directed by your health care provider. °· Drink enough fluid to keep your urine clear or pale yellow. °· Avoid fatty, greasy, and fried foods. These kinds of foods increase your body's demand for bile. °· Avoid any foods that make your pain worse. °· Avoid overeating. °· Avoid having a large meal after fasting. °SEEK MEDICAL CARE IF: °· You develop a fever. °· Your pain gets worse. °· You vomit. °· You develop nausea that prevents you from eating and drinking. °SEEK IMMEDIATE MEDICAL CARE IF: °· You suddenly develop a fever and shaking chills. °· You develop a yellowish discoloration (jaundice) of: °¨ Skin. °¨ Whites of the eyes. °¨ Mucous membranes. °· You have continuous or severe pain that is not relieved with medicines. °· You have nausea and vomiting that is not relieved with medicines. °· You develop dizziness or you faint. °  °This information is not intended to replace advice given to you by your health care provider. Make sure you discuss any questions you have with your health care provider. °  °Document  Released: 04/04/2006 Document Revised: 03/18/2015 Document Reviewed: 08/13/2014 °Elsevier Interactive Patient Education ©2016 [employer] ° °

## 2016-05-27 NOTE — Consult Note (Signed)
Reason for Consult: Cholelithiasis Referring Physician: Dr. [name] is an 50 y.o. male.  HPI: Patient is a 50 year old white male who just got back from a beach vacation who woke up this morning with substernal chest pain. He states that he thought he had an upset stomach secondary to drinking the water at the beach. This was the first time he had any type of chest pain like this. He denied any fever, chills, fatty food intolerance, or jaundice. He presented emergency room for further evaluation treatment. Cardiac and pulmonary workup were both negative. Ultrasound the gallbladder revealed cholelithiasis with a slightly thickened gallbladder wall. No choledocholithiasis was seen. He states he still has some substernal chest pain, but it is improved.  Past Medical History  Diagnosis Date  . Gout   . Kidney stones     Past Surgical History  Procedure Laterality Date  . Rotator cuff surgery    . Egd/ed      years ago  . Colonoscopy N/A 03/01/2013    Procedure: COLONOSCOPY;  Surgeon: Ryan Houston, MD;  Location: [facility];  Service: Endoscopy;  Laterality: N/A;  100  . Back surgery      No family history on file.  Social History:  reports that he has never smoked. He does not have any smokeless tobacco history on file. He reports that he does not drink alcohol or use illicit drugs.  Allergies:  Allergies  Allergen Reactions  . Phenergan [Promethazine Hcl] Anaphylaxis    ?severe hypotension  . Allopurinol Other (See Comments)    depression  . Colchicine Other (See Comments)    Depression    . Penicillins     From childhood Has patient had a PCN reaction causing immediate rash, facial/tongue/throat swelling, SOB or lightheadedness with hypotension: Unknown Has patient had a PCN reaction causing severe rash involving mucus membranes or skin necrosis: Unknown Has patient had a PCN reaction that required hospitalization Unknown Has patient had a PCN reaction  occurring within the last 10 years: Unknown If all of the above answers are "NO", then may proceed with Cephalosporin [place]  . Vancomycin Swelling    Lips swelling    Medications: Prior to Admission:  (Not in a hospital admission)  Results for orders placed or performed during the hospital encounter of 05/27/16 (from the past 48 hour(s))  CBC with Differential     Status: None   Collection Time: 05/27/16  6:30 AM  Result Value Ref Range   WBC 5.8 4.0 - 10.5 K/uL   RBC 4.89 4.22 - 5.81 MIL/uL   Hemoglobin 14.8 13.0 - 17.0 g/dL   HCT 43.4 39.0 - 52.0 %   MCV 88.8 78.0 - 100.0 fL   MCH 30.3 26.0 - 34.0 pg   MCHC 34.1 30.0 - 36.0 g/dL   RDW 13.3 11.5 - 15.5 %   Platelets 187 150 - 400 K/uL   Neutrophils Relative % 53 %   Neutro Abs 3.1 1.7 - 7.7 K/uL   Lymphocytes Relative 36 %   Lymphs Abs 2.1 0.7 - 4.0 K/uL   Monocytes Relative 9 %   Monocytes Absolute 0.5 0.1 - 1.0 K/uL   Eosinophils Relative 2 %   Eosinophils Absolute 0.1 0.0 - 0.7 K/uL   Basophils Relative 1 %   Basophils Absolute 0.0 0.0 - 0.1 K/uL  Basic metabolic panel     Status: Abnormal   Collection Time: 05/27/16  6:30 AM  Result Value Ref Range  Sodium 138 135 - 145 mmol/L   Potassium 3.5 3.5 - 5.1 mmol/L   Chloride 105 101 - 111 mmol/L   CO2 26 22 - 32 mmol/L   Glucose, Bld 127 (H) 65 - 99 mg/dL   BUN 9 6 - 20 mg/dL   Creatinine, Ser 0.87 0.61 - 1.24 mg/dL   Calcium 8.9 8.9 - 10.3 mg/dL   GFR calc non Af Amer >60 >60 mL/min   GFR calc Af Amer >60 >60 mL/min    Comment: (NOTE) The eGFR has been calculated using the CKD EPI equation. This calculation has not been validated in all clinical situations. eGFR's persistently <60 mL/min signify possible Chronic Kidney Disease.    Anion gap 7 5 - 15  Troponin I     Status: None   Collection Time: 05/27/16  6:30 AM  Result Value Ref Range   Troponin I <0.03 <0.03 ng/mL  Hepatic function panel     Status: None   Collection Time: 05/27/16  6:30 AM  Result Value  Ref Range   Total Protein 7.4 6.5 - 8.1 g/dL   Albumin 4.4 3.5 - 5.0 g/dL   AST 22 15 - 41 U/L   ALT 29 17 - 63 U/L   Alkaline Phosphatase 70 38 - 126 U/L   Total Bilirubin 0.4 0.3 - 1.2 mg/dL   Bilirubin, Direct 0.1 0.1 - 0.5 mg/dL   Indirect Bilirubin 0.3 0.3 - 0.9 mg/dL  Lipase, blood     Status: None   Collection Time: 05/27/16  6:30 AM  Result Value Ref Range   Lipase 21 11 - 51 U/L  Troponin I     Status: None   Collection Time: 05/27/16  9:13 AM  Result Value Ref Range   Troponin I <0.03 <0.03 ng/mL    Dg Chest 2 View  05/27/2016  CLINICAL DATA:  Central chest pain for several hours EXAM: CHEST  2 VIEW COMPARISON:  10/20/2014 FINDINGS: The heart size and mediastinal contours are within normal limits. Both lungs are clear. The visualized skeletal structures are unremarkable. IMPRESSION: No active cardiopulmonary disease. Electronically Signed   By: Inez Catalina M.D.   On: 05/27/2016 07:25   Ct Angio Chest Pe W/cm &/or Wo Cm  05/27/2016  CLINICAL DATA:  Central chest pain and epigastric pain which awoke the patient from sleep. EXAM: CT ANGIOGRAPHY CHEST WITH CONTRAST TECHNIQUE: Multidetector CT imaging of the chest was performed using the standard protocol during bolus administration of intravenous contrast. Multiplanar CT image reconstructions and MIPs were obtained to evaluate the vascular anatomy. CONTRAST:  100 mL Isovue 370 COMPARISON:  Two-view chest x-ray 05/27/2016. FINDINGS: Cardiovascular: The heart size is normal. Pulmonary arterial opacification is excellent. There are no focal filling defects to suggest pulmonary embolus. No significant pericardial effusion is present. No significant vascular calcifications are present within the coronary arteries. Mediastinum/Nodes: No significant mediastinal or axillary adenopathy is present. The trachea and esophagus are within normal limits. The thoracic inlet is unremarkable. Lungs/Pleura: Mild dependent atelectasis is present in both  lungs. No focal nodule, mass, or airspace consolidation is present. There is no pneumothorax. No significant pleural effusion is present. Upper Abdomen: Limited imaging of the upper abdomen is unremarkable. Musculoskeletal: Anterior osteophytes are fused at T8-9 and T9-10. Vertebral body heights alignment are maintained. No focal lytic or blastic lesions are present. The ribs are within normal limits. Review of the MIP images confirms the above findings. IMPRESSION: 1. No acute or focal lesion to explain the  patient's symptoms. 2. No pulmonary embolus. Electronically Signed   By: San Morelle M.D.   On: 05/27/2016 08:33   US Abdomen Limited Ruq  05/27/2016  CLINICAL DATA:  Epigastric pain since 4 a.m.  Nausea. EXAM: US ABDOMEN LIMITED - RIGHT UPPER QUADRANT COMPARISON:  12/15/2012 CT FINDINGS: Gallbladder: Gallbladder wall is 5.4 mm in thickness. Gallbladder sludge and small stones identified within the dependent portion of the gallbladder, largest stone measuring 9 mm in diameter. No pericholecystic fluid. Negative for sonographic Murphy's sign. Common bile duct: Diameter: 5.4 mm Liver: The liver is echogenic. There is attenuation of the ultrasound wave, poor visualization of the internal hepatic architecture, and loss of definition of the diaphragm. No focal liver lesions are identified. There is limited evaluation of left hepatic lobe because of overlying bowel gas. IMPRESSION: 1. Hepatic steatosis. No focal liver lesions are identified. Limited evaluation of the left hepatic lobe. 2. Gallbladder wall thickening, gallbladder sludge and stones suspicious for acute cholecystitis. The salient findings were discussed with Ryan Fuller on 05/27/2016 at 11:24 am. Electronically Signed   By: Nolon Nations M.D.   On: 05/27/2016 11:24    ROS:  Pertinent items noted in HPI and remainder of comprehensive ROS otherwise negative.  Blood pressure 118/84, pulse 67, temperature 97.6 F (36.4 C), temperature  source Oral, resp. rate 19, height 6' (1.829 m), weight 100.699 kg (222 lb), SpO2 98 %. Physical Exam: Well-developed well-nourished white male in no acute distress. HEENT examination reveals no scleral icterus. Neck is supple without lymphadenopathy. Heart examination reveals a regular rate and rhythm without S3, S4, murmurs. Abdomen is soft, nontender, nondistended. No Percell Miller sign is appreciated. No hepatosplenomegaly, masses, or hernias identified.  Assessment/Plan: Impression: Chest pain, cholelithiasis. The patient's symptoms are atypical for biliary colic. His liver enzyme tests, white blood cell count, and abdominal examination are all unremarkable. It is difficult to a certain whether this was a biliary colic episode. I talked extensively with the patient and family. They would like to avoid cholecystectomy if it all possible, which is fine with me. Signs and symptoms of biliary colic were explained to the patient. Should he have recurrence, he was instructed to follow-up in my office.  Ryan Fuller A 05/27/2016, 12:24 PM

## 2016-05-27 NOTE — ED Provider Notes (Signed)
Pt continued to have chest pain, but it did not sound cardiac.  I ordered a CT angio that was nl and a GB US.  The US showed acute cholecystitis, so I spoke with Dr. Arnoldo Morale who will see pt in the ED.  Dr. Arnoldo Morale spoke with pt and pt has no pain now.  He told pt that he could take his gallbladder out now or wait.  Pt opted to wait.  Pt will be d/c'd home and instr to f/u with Dr. Arnoldo Morale.  He knows to return if worse.  Isla Pence, MD 05/27/16 [phone]

## 2016-05-27 NOTE — ED Notes (Signed)
Pt reports pain to center of chest and epigastric area onset approx 4 am awoke from sleep.

## 2017-08-03 ENCOUNTER — Encounter (INDEPENDENT_AMBULATORY_CARE_PROVIDER_SITE_OTHER): Payer: Self-pay | Admitting: Internal Medicine

## 2017-08-03 ENCOUNTER — Encounter (INDEPENDENT_AMBULATORY_CARE_PROVIDER_SITE_OTHER): Payer: Self-pay

## 2017-08-10 ENCOUNTER — Other Ambulatory Visit (INDEPENDENT_AMBULATORY_CARE_PROVIDER_SITE_OTHER): Payer: Self-pay | Admitting: Internal Medicine

## 2017-08-10 ENCOUNTER — Encounter (INDEPENDENT_AMBULATORY_CARE_PROVIDER_SITE_OTHER): Payer: Self-pay | Admitting: Internal Medicine

## 2017-08-10 ENCOUNTER — Ambulatory Visit (INDEPENDENT_AMBULATORY_CARE_PROVIDER_SITE_OTHER): Payer: Commercial Managed Care - PPO | Admitting: Internal Medicine

## 2017-08-10 DIAGNOSIS — R1319 Other dysphagia: Secondary | ICD-10-CM

## 2017-08-10 DIAGNOSIS — M109 Gout, unspecified: Secondary | ICD-10-CM | POA: Insufficient documentation

## 2017-08-10 DIAGNOSIS — R1310 Dysphagia, unspecified: Secondary | ICD-10-CM | POA: Diagnosis not present

## 2017-08-10 HISTORY — DX: Gout, unspecified: M10.9

## 2017-08-10 HISTORY — DX: Dysphagia, unspecified: R13.10

## 2017-08-10 MED ORDER — PANTOPRAZOLE SODIUM 40 MG PO TBEC: 40.0000 mg | DELAYED_RELEASE_TABLET | Freq: Every day | ORAL | 3 refills | Status: DC

## 2017-08-10 NOTE — Patient Instructions (Signed)
EGD/ED. The risks of bleeding, perforation and infection were reviewed with patient.  

## 2017-08-10 NOTE — Progress Notes (Signed)
   Subjective:    Patient ID: Ryan Fuller, male    DOB: Mar 10, 1966, 51 y.o.   MRN: [id number]  HPI Referred by Dr. Hilma Favors for dysphagia. Symptoms since his neck surgery a year ago. He says breads and salads will lodge. Meats will lodge if he doesn't chew it well.  Has had an EGD/ED years ago.  Several times if he bends over after eating, the bolus will come back up. Appetite is good. No weight loss.  BMs x daily.  He takes fiber every night  He states he had an infection after he had back surgery.  Discitis?   His last colonoscopy was in 2014 by Dr. Laural Golden (abdominal pain). Impression:  Normal mucosa of terminal ileum. Mild left-sided diverticulosis. 2 small polyps ablated via cold biopsy and submitted together(transverse and sigmoid colon). Small external hemorrhoids.  Patient had 2 small polyps removed and they're both tubular adenomas. Review of Systems Past Medical History:  Diagnosis Date  . Dysphagia 08/10/2017  . Gout   . Gout 08/10/2017  . Kidney stones     Past Surgical History:  Procedure Laterality Date  . BACK SURGERY    . COLONOSCOPY N/A 03/01/2013   Procedure: COLONOSCOPY;  Surgeon: Rogene Houston, MD;  Location: [facility];  Service: Endoscopy;  Laterality: N/A;  100  . EGD/ED     years ago  . Rotator cuff surgery      Allergies  Allergen Reactions  . Phenergan [Promethazine Hcl] Anaphylaxis    ?severe hypotension  . Allopurinol Other (See Comments)    States he is not allergic to this  . Colchicine Other (See Comments)    Depression    . Penicillins     From childhood Has patient had a PCN reaction causing immediate rash, facial/tongue/throat swelling, SOB or lightheadedness with hypotension: Unknown Has patient had a PCN reaction causing severe rash involving mucus membranes or skin necrosis: Unknown Has patient had a PCN reaction that required hospitalization Unknown Has patient had a PCN reaction occurring within the last 10 years:  Unknown If all of the above answers are "NO", then may proceed with Cephalosporin [place]  . Vancomycin Swelling    Lips swelling    Current Outpatient Prescriptions on File Prior to Visit  Medication Sig Dispense Refill  . HYDROcodone-acetaminophen (NORCO/VICODIN) 5-325 MG tablet Take 2 tablets by mouth at bedtime.     Marland Kitchen omeprazole (PRILOSEC) 20 MG capsule Take 20 mg by mouth daily.     No current facility-administered medications on file prior to visit.         Objective:   Physical Exam Blood pressure 120/74, pulse 72, temperature 98.1 F (36.7 C), height 6' (1.829 m), weight 237 lb 9.6 oz (107.8 kg). Alert and oriented. Skin warm and dry. Oral mucosa is moist.   . Sclera anicteric, conjunctivae is pink. Thyroid not enlarged. No cervical lymphadenopathy. Lungs clear. Heart regular rate and rhythm.  Abdomen is soft. Bowel sounds are positive. No hepatomegaly. No abdominal masses felt. No tenderness.  No edema to lower extremities.           Assessment & Plan:  Dysphagia. Esophageal stricture needs to be ruled out. Hx of same. The risks of bleeding, perforation and infection were reviewed with patient. Rx for Protonix sent to his pharmacy,.

## 2017-08-11 ENCOUNTER — Encounter (INDEPENDENT_AMBULATORY_CARE_PROVIDER_SITE_OTHER): Payer: Self-pay | Admitting: *Deleted

## 2017-08-11 DIAGNOSIS — R1319 Other dysphagia: Secondary | ICD-10-CM | POA: Insufficient documentation

## 2017-08-11 DIAGNOSIS — R1310 Dysphagia, unspecified: Secondary | ICD-10-CM | POA: Insufficient documentation

## 2017-08-31 ENCOUNTER — Ambulatory Visit (HOSPITAL_COMMUNITY)
Admission: RE | Admit: 2017-08-31 | Discharge: 2017-08-31 | Disposition: A | Discharge status: Home / Self Care | Payer: Commercial Managed Care - PPO | Source: Ambulatory Visit | Attending: Internal Medicine | Admitting: Internal Medicine

## 2017-08-31 ENCOUNTER — Encounter (HOSPITAL_COMMUNITY): Payer: Self-pay | Admitting: *Deleted

## 2017-08-31 ENCOUNTER — Encounter (HOSPITAL_COMMUNITY)
Admission: RE | Disposition: A | Discharge status: Home / Self Care | Payer: Self-pay | Source: Ambulatory Visit | Attending: Internal Medicine

## 2017-08-31 DIAGNOSIS — K449 Diaphragmatic hernia without obstruction or gangrene: Secondary | ICD-10-CM | POA: Diagnosis not present

## 2017-08-31 DIAGNOSIS — Z880 Allergy status to penicillin: Secondary | ICD-10-CM | POA: Diagnosis not present

## 2017-08-31 DIAGNOSIS — Z79891 Long term (current) use of opiate analgesic: Secondary | ICD-10-CM | POA: Diagnosis not present

## 2017-08-31 DIAGNOSIS — K219 Gastro-esophageal reflux disease without esophagitis: Secondary | ICD-10-CM | POA: Insufficient documentation

## 2017-08-31 DIAGNOSIS — R1314 Dysphagia, pharyngoesophageal phase: Secondary | ICD-10-CM | POA: Diagnosis not present

## 2017-08-31 DIAGNOSIS — R1310 Dysphagia, unspecified: Secondary | ICD-10-CM | POA: Diagnosis present

## 2017-08-31 DIAGNOSIS — M109 Gout, unspecified: Secondary | ICD-10-CM | POA: Diagnosis not present

## 2017-08-31 DIAGNOSIS — Z79899 Other long term (current) drug therapy: Secondary | ICD-10-CM | POA: Insufficient documentation

## 2017-08-31 DIAGNOSIS — Z881 Allergy status to other antibiotic agents status: Secondary | ICD-10-CM | POA: Insufficient documentation

## 2017-08-31 DIAGNOSIS — R1319 Other dysphagia: Secondary | ICD-10-CM | POA: Insufficient documentation

## 2017-08-31 HISTORY — PX: ESOPHAGEAL DILATION: SHX303

## 2017-08-31 HISTORY — PX: ESOPHAGOGASTRODUODENOSCOPY: SHX5428

## 2017-08-31 SURGERY — EGD (ESOPHAGOGASTRODUODENOSCOPY)
Anesthesia: Moderate Sedation

## 2017-08-31 MED ORDER — MIDAZOLAM HCL 5 MG/5ML IJ SOLN
INTRAMUSCULAR | Status: AC
  Filled 2017-08-31: qty 10

## 2017-08-31 MED ORDER — LIDOCAINE VISCOUS 2 % MT SOLN
OROMUCOSAL | Status: DC
Start: 2017-08-31 — End: 2017-08-31
  Filled 2017-08-31: qty 15

## 2017-08-31 MED ORDER — SODIUM CHLORIDE 0.9 % IV SOLN
INTRAVENOUS | Status: DC
  Administered 2017-08-31: 07:00:00 via INTRAVENOUS

## 2017-08-31 MED ORDER — MEPERIDINE HCL 50 MG/ML IJ SOLN
INTRAMUSCULAR | Status: DC
Start: 2017-08-31 — End: 2017-08-31
  Filled 2017-08-31: qty 1

## 2017-08-31 MED ORDER — MIDAZOLAM HCL 5 MG/5ML IJ SOLN
INTRAMUSCULAR | Status: DC | PRN
  Administered 2017-08-31 (×3): 2 mg via INTRAVENOUS

## 2017-08-31 MED ORDER — MEPERIDINE HCL 50 MG/ML IJ SOLN
INTRAMUSCULAR | Status: DC | PRN
  Administered 2017-08-31 (×2): 25 mg via INTRAVENOUS

## 2017-08-31 MED ORDER — LIDOCAINE VISCOUS 2 % MT SOLN
OROMUCOSAL | Status: DC | PRN
  Administered 2017-08-31: 4 mL via OROMUCOSAL

## 2017-08-31 MED ORDER — STERILE WATER FOR IRRIGATION IR SOLN
Status: DC | PRN
  Administered 2017-08-31: 08:00:00

## 2017-08-31 NOTE — H&P (Signed)
Ryan Fuller is an 51 y.o. male.   Chief Complaint: patient is here for EGD and ED. HPI: patient is 51 year old Caucasian male was chronic GERD and now presents with about a year history of dysphagia to solids. States he had his esophagus stretched many years ago. He started having problems a year ago when he had neck surgery.Marland Kitchen He points to suprasternal area at site of bolus obstruction.he had single episode of food impaction relieved with Heimlich maneuver.he states hea weight loss or melena.  Past Medical History:  Diagnosis Date  . Dysphagia 08/10/2017  . Gout   . Gout 08/10/2017  . Kidney stones     Past Surgical History:  Procedure Laterality Date  . BACK SURGERY    . COLONOSCOPY N/A 03/01/2013   Procedure: COLONOSCOPY;  Surgeon: Rogene Houston, MD;  Location: [facility];  Service: Endoscopy;  Laterality: N/A;  100  . EGD/ED     years ago  . Rotator cuff surgery      History reviewed. No pertinent family history. Social History:  reports that he has never smoked. He has never used smokeless tobacco. He reports that he does not drink alcohol or use drugs.  Allergies:  Allergies  Allergen Reactions  . Phenergan [Promethazine Hcl] Anaphylaxis    ?severe hypotension  . Colchicine Other (See Comments)    Depression    . Penicillins     From childhood Has patient had a PCN reaction causing immediate rash, facial/tongue/throat swelling, SOB or lightheadedness with hypotension: Unknown Has patient had a PCN reaction causing severe rash involving mucus membranes or skin necrosis: Unknown Has patient had a PCN reaction that required hospitalization Unknown Has patient had a PCN reaction occurring within the last 10 years: Unknown If all of the above answers are "NO", then may proceed with Cephalosporin [place]  . Vancomycin Swelling    Lips swelling    Medications Prior to Admission  Medication Sig Dispense Refill  . allopurinol (ZYLOPRIM) 100 MG tablet Take 100 mg by mouth  daily.    Marland Kitchen HYDROcodone-acetaminophen (NORCO/VICODIN) 5-325 MG tablet Take 2 tablets by mouth at bedtime.     . pantoprazole (PROTONIX) 40 MG tablet Take 1 tablet (40 mg total) by mouth daily. 30 tablet 3  . polyethylene glycol (MIRALAX / GLYCOLAX) packet Take 17 g by mouth every morning.    . Psyllium (METAMUCIL FREE & NATURAL PO) Take 2 scoop by mouth at bedtime.      No results found for this or any previous visit (from the past 48 hour(s)). No results found.  ROS  Blood pressure 118/81, pulse (!) 57, temperature 97.7 F (36.5 C), temperature source Oral, resp. rate (!) 9, SpO2 100 %. Physical Exam  Constitutional: He appears well-developed and well-nourished.  HENT:  Mouth/Throat: Oropharynx is clear and moist.  Eyes: Conjunctivae are normal. No scleral icterus.  Neck: No thyromegaly present.  Cardiovascular: Normal rate, regular rhythm and normal heart sounds.   No murmur heard. Respiratory: Effort normal and breath sounds normal.  GI: Soft. He exhibits no distension and no mass. There is no tenderness.  Musculoskeletal: He exhibits no edema.  Lymphadenopathy:    He has no cervical adenopathy.  Neurological: He is alert.  Skin: Skin is warm and dry.     Assessment/Plan  Solid food dysphagia. EGD with ED.  Hildred Laser, MD 08/31/2017, 7:34 AM

## 2017-08-31 NOTE — Op Note (Signed)
[facility] Patient Name: Ryan Fuller Procedure Date: 08/31/2017 7:14 AM MRN: [id number] Date of Birth: [birth year] Attending MD: Hildred Laser , MD CSN: [id number] Age: 51 Admit Type: Outpatient Procedure:                Upper GI endoscopy Indications:              Esophageal dysphagia, Gastro-esophageal reflux                            disease Providers:                Hildred Laser, MD, Otis Peak B. Sharon Seller, RN, Randa Spike, Technician Referring MD:             Halford Chessman, MD Medicines:                Lidocaine spray, Meperidine 50 mg IV, Midazolam 6                            mg IV Complications:            No immediate complications. Estimated Blood Loss:     Estimated blood loss: none. Procedure:                Pre-Anesthesia Assessment:                           - Prior to the procedure, a History and Physical                            was performed, and patient medications and                            allergies were reviewed. The patient's tolerance of                            previous anesthesia was also reviewed. The risks                            and benefits of the procedure and the sedation                            options and risks were discussed with the patient.                            All questions were answered, and informed consent                            was obtained. Prior Anticoagulants: The patient has                            taken no previous anticoagulant or antiplatelet                            agents.  ASA Grade Assessment: II - A patient with                            mild systemic disease. After reviewing the risks                            and benefits, the patient was deemed in                            satisfactory condition to undergo the procedure.                           After obtaining informed consent, the endoscope was                            passed under direct vision. Throughout  the                            procedure, the patient's blood pressure, pulse, and                            oxygen saturations were monitored continuously. The                            [id number] ([id number]) scope was introduced through the                            mouth, and advanced to the second part of duodenum.                            The upper GI endoscopy was accomplished without                            difficulty. The patient tolerated the procedure                            well. Scope In: 7:46:27 AM Scope Out: 6:76:72 AM Total Procedure Duration: 0 hours 9 minutes 4 seconds  Findings:      The examined esophagus was normal.      The Z-line was regular and was found 41 cm from the incisors.      A 2 cm hiatal hernia was present.      No endoscopic abnormality was evident in the esophagus to explain the       patient's complaint of dysphagia. It was decided, however, to proceed       with dilation of the entire esophagus. The scope was withdrawn. Dilation       was performed with a Maloney dilator with no resistance at 63 Fr and 56       Fr. The dilation site was examined following endoscope reinsertion and       showed no change and no bleeding, mucosal tear or perforation.      The entire examined stomach was normal.      The duodenal bulb and second portion of the duodenum were normal. Impression:               -  Normal esophagus.                           - Z-line regular, 41 cm from the incisors.                           - 2 cm hiatal hernia.                           - No endoscopic esophageal abnormality to explain                            patient's dysphagia. Esophagus dilated. Dilated.                           - Normal stomach.                           - Normal duodenal bulb and second portion of the                            duodenum.                           - No specimens collected. Moderate Sedation:      Moderate (conscious) sedation was  administered by the endoscopy nurse       and supervised by the endoscopist. The following parameters were       monitored: oxygen saturation, heart rate, blood pressure, CO2       capnography and response to care. Total physician intraservice time was       16 minutes. Recommendation:           - Patient has a contact number available for                            emergencies. The signs and symptoms of potential                            delayed complications were discussed with the                            patient. Return to normal activities tomorrow.                            Written discharge instructions were provided to the                            patient.                           - Resume previous diet today.                           - Continue present medications.                           - Telephone GI clinic in 1 week. Procedure  Code(s):        --- Professional ---                           [phone], Esophagogastroduodenoscopy, flexible,                            transoral; diagnostic, including collection of                            specimen(s) by brushing or washing, when performed                            (separate procedure)                           43450, Dilation of esophagus, by unguided sound or                            bougie, single or multiple passes                           99152, Moderate sedation services provided by the                            same physician or other qualified health care                            professional performing the diagnostic or                            therapeutic service that the sedation supports,                            requiring the presence of an independent trained                            observer to assist in the monitoring of the                            patient's level of consciousness and physiological                            status; initial 15 minutes of intraservice time,                             patient age 39 years or older Diagnosis Code(s):        --- Professional ---                           K44.9, Diaphragmatic hernia without obstruction or                            gangrene  R13.14, Dysphagia, pharyngoesophageal phase                           K21.9, Gastro-esophageal reflux disease without                            esophagitis CPT copyright 2016 American Medical Association. All rights reserved. The codes documented in this report are preliminary and upon coder review may  be revised to meet current compliance requirements. Hildred Laser, MD Hildred Laser, MD 08/31/2017 8:08:05 AM This report has been signed electronically. Number of Addenda: 0

## 2017-08-31 NOTE — Discharge Instructions (Signed)
Resume usual medications and diet. No driving for 24 hours. Please call office with progress report in one week.  Esophagogastroduodenoscopy, Care After Refer to this sheet in the next few weeks. These instructions provide you with information about caring for yourself after your procedure. Your health care provider may also give you more specific instructions. Your treatment has been planned according to current medical practices, but problems sometimes occur. Call your health care provider if you have any problems or questions after your procedure. What can I expect after the procedure? After the procedure, it is common to have:  A sore throat.  Nausea.  Bloating.  Dizziness.  Fatigue.  Follow these instructions at home:  Do not eat or drink anything until the numbing medicine (local anesthetic) has worn off and your gag reflex has returned. You will know that the local anesthetic has worn off when you can swallow comfortably.  Do not drive for 24 hours if you received a medicine to help you relax (sedative).  If your health care provider took a tissue sample for testing during the procedure, make sure to get your test results. This is your responsibility. Ask your health care provider or the department performing the test when your results will be ready.  Keep all follow-up visits as told by your health care provider. This is important. Contact a health care provider if:  You cannot stop coughing.  You are not urinating.  You are urinating less than usual. Get help right away if:  You have trouble swallowing.  You cannot eat or drink.  You have throat or chest pain that gets worse.  You are dizzy or light-headed.  You faint.  You have nausea or vomiting.  You have chills.  You have a fever.  You have severe abdominal pain.  You have black, tarry, or bloody stools. This information is not intended to replace advice given to you by your health care provider.  Make sure you discuss any questions you have with your health care provider. Document Released: 10/18/2012 Document Revised: 04/08/2016 Document Reviewed: 09/25/2015 Elsevier Interactive Patient Education  [employer].

## 2017-09-05 ENCOUNTER — Encounter (HOSPITAL_COMMUNITY): Payer: Self-pay | Admitting: Internal Medicine

## 2017-10-28 ENCOUNTER — Other Ambulatory Visit: Payer: Self-pay | Admitting: General Surgery

## 2017-11-04 ENCOUNTER — Other Ambulatory Visit (INDEPENDENT_AMBULATORY_CARE_PROVIDER_SITE_OTHER): Payer: Self-pay | Admitting: Internal Medicine

## 2017-11-04 DIAGNOSIS — R1310 Dysphagia, unspecified: Secondary | ICD-10-CM

## 2017-11-04 DIAGNOSIS — R1319 Other dysphagia: Secondary | ICD-10-CM

## 2017-12-13 ENCOUNTER — Other Ambulatory Visit: Payer: Self-pay | Admitting: Neurosurgery

## 2017-12-13 DIAGNOSIS — M47812 Spondylosis without myelopathy or radiculopathy, cervical region: Secondary | ICD-10-CM

## 2017-12-19 ENCOUNTER — Ambulatory Visit
Admission: RE | Admit: 2017-12-19 | Discharge: 2017-12-19 | Disposition: A | Discharge status: Home / Self Care | Payer: Commercial Managed Care - PPO | Source: Ambulatory Visit | Attending: Neurosurgery | Admitting: Neurosurgery

## 2017-12-19 DIAGNOSIS — M47812 Spondylosis without myelopathy or radiculopathy, cervical region: Secondary | ICD-10-CM

## 2017-12-19 MED ORDER — TRIAMCINOLONE ACETONIDE 40 MG/ML IJ SUSP (RADIOLOGY)
60.0000 mg | Freq: Once | INTRAMUSCULAR | Status: AC
  Administered 2017-12-19: 60 mg via EPIDURAL

## 2017-12-19 MED ORDER — IOPAMIDOL (ISOVUE-M 300) INJECTION 61%
1.0000 mL | Freq: Once | INTRAMUSCULAR | Status: AC | PRN
  Administered 2017-12-19: 1 mL via EPIDURAL

## 2017-12-19 NOTE — Discharge Instructions (Signed)

## 2018-02-22 ENCOUNTER — Encounter (INDEPENDENT_AMBULATORY_CARE_PROVIDER_SITE_OTHER): Payer: Self-pay | Admitting: *Deleted

## 2018-03-29 ENCOUNTER — Other Ambulatory Visit (HOSPITAL_COMMUNITY): Payer: Self-pay | Admitting: Family Medicine

## 2018-03-29 ENCOUNTER — Ambulatory Visit (HOSPITAL_COMMUNITY)
Admission: RE | Admit: 2018-03-29 | Discharge: 2018-03-29 | Disposition: A | Discharge status: Home / Self Care | Payer: Commercial Managed Care - PPO | Source: Ambulatory Visit | Attending: Family Medicine | Admitting: Family Medicine

## 2018-03-29 DIAGNOSIS — R918 Other nonspecific abnormal finding of lung field: Secondary | ICD-10-CM | POA: Diagnosis not present

## 2018-03-29 DIAGNOSIS — R0602 Shortness of breath: Secondary | ICD-10-CM | POA: Insufficient documentation

## 2018-03-29 DIAGNOSIS — K219 Gastro-esophageal reflux disease without esophagitis: Secondary | ICD-10-CM | POA: Diagnosis not present

## 2018-04-04 ENCOUNTER — Telehealth (INDEPENDENT_AMBULATORY_CARE_PROVIDER_SITE_OTHER): Payer: Self-pay | Admitting: Internal Medicine

## 2018-04-04 NOTE — Telephone Encounter (Signed)
Patient called stating he is still having indigestion - would like to change medication - please call [phone]

## 2018-04-05 NOTE — Telephone Encounter (Signed)
Per Dr.Rehman the patient will need to work on his diet, change Pantoprazole to Nexium 40 mg QD and take Pepcid in the evening. He should allow 1 month or so to see how he responds , and the patient will need to have a office visit.

## 2018-04-06 NOTE — Telephone Encounter (Signed)
Patient was called and made aware. The prescription and OTC medication was called to [facility]. Patient will need a OV in the next month of so to see how the patient has responded to change in PPI therapy.

## 2018-04-06 NOTE — Telephone Encounter (Signed)
Insurance will not pay for the Nexium as previously prescribed. Insurance company called and they state that  Lansoprazole , Omeprazole , Pantoprazole. Patient had been on Omeprazole prior to seeing [place] , he was placed on the Pantoprazole. Per Dr.Rehman may try Lansoprazole 30 mg take 1 by mouth in the morning, and take the Pepcid in the evening.  The RX was called to [facility].

## 2018-04-19 ENCOUNTER — Other Ambulatory Visit (HOSPITAL_COMMUNITY): Payer: Self-pay | Admitting: Neurosurgery

## 2018-04-19 DIAGNOSIS — M47816 Spondylosis without myelopathy or radiculopathy, lumbar region: Secondary | ICD-10-CM

## 2018-05-11 ENCOUNTER — Ambulatory Visit (HOSPITAL_COMMUNITY)
Admission: RE | Admit: 2018-05-11 | Discharge: 2018-05-11 | Disposition: A | Discharge status: Home / Self Care | Payer: Commercial Managed Care - PPO | Source: Ambulatory Visit | Attending: Neurosurgery | Admitting: Neurosurgery

## 2018-05-11 DIAGNOSIS — M47816 Spondylosis without myelopathy or radiculopathy, lumbar region: Secondary | ICD-10-CM | POA: Diagnosis not present

## 2018-05-11 DIAGNOSIS — M4647 Discitis, unspecified, lumbosacral region: Secondary | ICD-10-CM | POA: Insufficient documentation

## 2018-05-11 DIAGNOSIS — Z9889 Other specified postprocedural states: Secondary | ICD-10-CM | POA: Diagnosis not present

## 2018-05-11 DIAGNOSIS — M8938 Hypertrophy of bone, other site: Secondary | ICD-10-CM | POA: Diagnosis not present

## 2018-10-10 ENCOUNTER — Telehealth (INDEPENDENT_AMBULATORY_CARE_PROVIDER_SITE_OTHER): Payer: Self-pay | Admitting: Internal Medicine

## 2018-10-10 ENCOUNTER — Ambulatory Visit (INDEPENDENT_AMBULATORY_CARE_PROVIDER_SITE_OTHER): Payer: Commercial Managed Care - PPO | Admitting: Internal Medicine

## 2018-10-10 ENCOUNTER — Encounter (INDEPENDENT_AMBULATORY_CARE_PROVIDER_SITE_OTHER): Payer: Self-pay | Admitting: *Deleted

## 2018-10-10 ENCOUNTER — Encounter (INDEPENDENT_AMBULATORY_CARE_PROVIDER_SITE_OTHER): Payer: Self-pay | Admitting: Internal Medicine

## 2018-10-10 ENCOUNTER — Other Ambulatory Visit (INDEPENDENT_AMBULATORY_CARE_PROVIDER_SITE_OTHER): Payer: Self-pay | Admitting: *Deleted

## 2018-10-10 VITALS — BP 146/80 | HR 72 | Temp 98.1°F | Ht 72.0 in | Wt 232.7 lb

## 2018-10-10 DIAGNOSIS — Z860100 Personal history of colon polyps, unspecified: Secondary | ICD-10-CM | POA: Insufficient documentation

## 2018-10-10 DIAGNOSIS — Z800 Family history of malignant neoplasm of digestive organs: Secondary | ICD-10-CM

## 2018-10-10 DIAGNOSIS — K625 Hemorrhage of anus and rectum: Secondary | ICD-10-CM | POA: Insufficient documentation

## 2018-10-10 MED ORDER — SUPREP BOWEL PREP KIT 17.5-3.13-1.6 GM/177ML PO SOLN: 1.0000 | Freq: Once | ORAL | 0 refills | Status: AC

## 2018-10-10 NOTE — Progress Notes (Signed)
Subjective:    Patient ID: Ryan Fuller, male    DOB: Aug 27, 1966, 52 y.o.   MRN: [id number]  HPI Presents today with c/o abdominal pain. Takes Prevacid 30mg  am and Pecid in the evening. Acid reflux is controlled.  States the MIralax is not working now as well as it use to. He says he has pain in LLQ pain x 1 month.  He describes as a sticking feeling.  He says the other night he went to the [facility] and the toilet water was pinks. Had one incidence of this. Has not seen any blood since.  His stools are watery with the Miralax. Has a BM x 1-2 a day.  His last colonoscopy was in 2024.  Maternal aunt in her 55s with colon cancer. She is still alive.  Findings:   Prep excellent. Normal mucosa of terminal ileum. Small polyp ablated via cold biopsy from transverse colon. Another small polyp ablated via cold biopsy from distal sigmoid colon. Both of these polyps were submitted together. Scattered diverticula at sigmoid and descending colon. Small external hemorrhoids. 2 polyps removed were tubular adenoma.   EGD in October of 2018: 2 cm hiatal hernia. Normal exam. Esophagus dilated but no mucosal tears.   Review of Systems     Past Medical History:  Diagnosis Date  . Dysphagia 08/10/2017  . Gout   . Gout 08/10/2017  . Kidney stones     Past Surgical History:  Procedure Laterality Date  . BACK SURGERY    . COLONOSCOPY N/A 03/01/2013   Procedure: COLONOSCOPY;  Surgeon: Rogene Houston, MD;  Location: [facility];  Service: Endoscopy;  Laterality: N/A;  100  . EGD/ED     years ago  . ESOPHAGEAL DILATION N/A 08/31/2017   Procedure: ESOPHAGEAL DILATION;  Surgeon: Rogene Houston, MD;  Location: [facility];  Service: Endoscopy;  Laterality: N/A;  . ESOPHAGOGASTRODUODENOSCOPY N/A 08/31/2017   Procedure: ESOPHAGOGASTRODUODENOSCOPY (EGD);  Surgeon: Rogene Houston, MD;  Location: [facility];  Service: Endoscopy;  Laterality: N/A;  3:10  . Rotator cuff surgery      Allergies    Allergen Reactions  . Phenergan [Promethazine Hcl] Anaphylaxis    ?severe hypotension  . Colchicine Other (See Comments)    Depression    . Penicillins     From childhood Has patient had a PCN reaction causing immediate rash, facial/tongue/throat swelling, SOB or lightheadedness with hypotension: Unknown Has patient had a PCN reaction causing severe rash involving mucus membranes or skin necrosis: Unknown Has patient had a PCN reaction that required hospitalization Unknown Has patient had a PCN reaction occurring within the last 10 years: Unknown If all of the above answers are "NO", then may proceed with Cephalosporin [place]  . Vancomycin Swelling    Lips swelling    Current Outpatient Medications on File Prior to Visit  Medication Sig Dispense Refill  . allopurinol (ZYLOPRIM) 100 MG tablet Take 100 mg by mouth daily.    Marland Kitchen HYDROcodone-acetaminophen (NORCO/VICODIN) 5-325 MG tablet Take 2 tablets by mouth. One during the day and 2 at night.    . polyethylene glycol (MIRALAX / GLYCOLAX) packet Take 17 g by mouth every morning.    . Psyllium (METAMUCIL FREE & NATURAL PO) Take 2 scoop by mouth at bedtime.     No current facility-administered medications on file prior to visit.      Objective:   Physical Exam Blood pressure (!) 146/80, pulse 72, temperature 98.1 F (36.7 C), height  6' (1.829 m), weight 232 lb 11.2 oz (105.6 kg). Alert and oriented. Skin warm and dry. Oral mucosa is moist.   . Sclera anicteric, conjunctivae is pink. Thyroid not enlarged. No cervical lymphadenopathy. Lungs clear. Heart regular rate and rhythm.  Abdomen is soft. Bowel sounds are positive. No hepatomegaly. No abdominal masses felt. No tenderness.  No edema to lower extremities.   Rectal: No rectal masses felt. Guaiac negative.         Assessment & Plan:  Rectal bleeding. LLQ pain. Hx of tubular adenoma. Patient is due for a colonoscopy. The risks of bleeding, perforation and infection were reviewed with  patient.

## 2018-10-10 NOTE — Patient Instructions (Signed)
The risks of bleeding, perforation and infection were reviewed with patient.  

## 2018-10-10 NOTE — Addendum Note (Signed)
Addended by: Worthy Keeler on: 10/10/2018 04:36 PM   Modules accepted: Orders

## 2018-10-10 NOTE — Telephone Encounter (Signed)
Need to put this one back in.

## 2018-10-10 NOTE — Telephone Encounter (Signed)
Patient needs suprep 

## 2018-10-11 NOTE — Addendum Note (Signed)
Addended by: Lacretia Nicks H on: 10/11/2018 07:40 AM   Modules accepted: Level of Service, SmartSet

## 2018-10-11 NOTE — Telephone Encounter (Signed)
k

## 2018-10-11 NOTE — Telephone Encounter (Signed)
This encounter was created in error - please disregard.

## 2018-10-30 ENCOUNTER — Other Ambulatory Visit (INDEPENDENT_AMBULATORY_CARE_PROVIDER_SITE_OTHER): Payer: Self-pay | Admitting: Internal Medicine

## 2018-11-13 ENCOUNTER — Encounter (HOSPITAL_COMMUNITY): Payer: Self-pay

## 2018-11-13 ENCOUNTER — Encounter (HOSPITAL_COMMUNITY)
Admission: RE | Disposition: A | Discharge status: Home / Self Care | Payer: Self-pay | Source: Home / Self Care | Attending: Internal Medicine

## 2018-11-13 ENCOUNTER — Ambulatory Visit (HOSPITAL_COMMUNITY)
Admission: RE | Admit: 2018-11-13 | Discharge: 2018-11-13 | Disposition: A | Discharge status: Home / Self Care | Payer: Commercial Managed Care - PPO | Attending: Internal Medicine | Admitting: Internal Medicine

## 2018-11-13 ENCOUNTER — Other Ambulatory Visit: Payer: Self-pay

## 2018-11-13 DIAGNOSIS — K5900 Constipation, unspecified: Secondary | ICD-10-CM | POA: Diagnosis not present

## 2018-11-13 DIAGNOSIS — K625 Hemorrhage of anus and rectum: Secondary | ICD-10-CM | POA: Insufficient documentation

## 2018-11-13 DIAGNOSIS — Z1211 Encounter for screening for malignant neoplasm of colon: Secondary | ICD-10-CM | POA: Insufficient documentation

## 2018-11-13 DIAGNOSIS — K648 Other hemorrhoids: Secondary | ICD-10-CM | POA: Diagnosis not present

## 2018-11-13 DIAGNOSIS — D124 Benign neoplasm of descending colon: Secondary | ICD-10-CM

## 2018-11-13 DIAGNOSIS — K644 Residual hemorrhoidal skin tags: Secondary | ICD-10-CM

## 2018-11-13 DIAGNOSIS — Z800 Family history of malignant neoplasm of digestive organs: Secondary | ICD-10-CM

## 2018-11-13 DIAGNOSIS — Z09 Encounter for follow-up examination after completed treatment for conditions other than malignant neoplasm: Secondary | ICD-10-CM

## 2018-11-13 DIAGNOSIS — K633 Ulcer of intestine: Secondary | ICD-10-CM | POA: Insufficient documentation

## 2018-11-13 DIAGNOSIS — Z791 Long term (current) use of non-steroidal anti-inflammatories (NSAID): Secondary | ICD-10-CM | POA: Insufficient documentation

## 2018-11-13 DIAGNOSIS — D123 Benign neoplasm of transverse colon: Secondary | ICD-10-CM | POA: Insufficient documentation

## 2018-11-13 DIAGNOSIS — K5730 Diverticulosis of large intestine without perforation or abscess without bleeding: Secondary | ICD-10-CM | POA: Insufficient documentation

## 2018-11-13 DIAGNOSIS — Z860100 Personal history of colon polyps, unspecified: Secondary | ICD-10-CM | POA: Insufficient documentation

## 2018-11-13 DIAGNOSIS — Z79899 Other long term (current) drug therapy: Secondary | ICD-10-CM | POA: Diagnosis not present

## 2018-11-13 DIAGNOSIS — M109 Gout, unspecified: Secondary | ICD-10-CM | POA: Insufficient documentation

## 2018-11-13 HISTORY — PX: BIOPSY: SHX5522

## 2018-11-13 HISTORY — PX: POLYPECTOMY: SHX5525

## 2018-11-13 HISTORY — PX: COLONOSCOPY: SHX5424

## 2018-11-13 SURGERY — COLONOSCOPY
Anesthesia: Moderate Sedation

## 2018-11-13 MED ORDER — SODIUM CHLORIDE 0.9 % IV SOLN
INTRAVENOUS | Status: DC
  Administered 2018-11-13: 10:00:00 via INTRAVENOUS

## 2018-11-13 MED ORDER — MEPERIDINE HCL 50 MG/ML IJ SOLN
INTRAMUSCULAR | Status: DC | PRN
  Administered 2018-11-13 (×2): 25 mg via INTRAVENOUS

## 2018-11-13 MED ORDER — MIDAZOLAM HCL 5 MG/5ML IJ SOLN
INTRAMUSCULAR | Status: AC
  Filled 2018-11-13: qty 10

## 2018-11-13 MED ORDER — MIDAZOLAM HCL 5 MG/5ML IJ SOLN
INTRAMUSCULAR | Status: DC | PRN
  Administered 2018-11-13: 2 mg via INTRAVENOUS
  Administered 2018-11-13: 3 mg via INTRAVENOUS
  Administered 2018-11-13: 2 mg via INTRAVENOUS

## 2018-11-13 MED ORDER — MEPERIDINE HCL 50 MG/ML IJ SOLN
INTRAMUSCULAR | Status: AC
  Filled 2018-11-13: qty 1

## 2018-11-13 MED ORDER — STERILE WATER FOR IRRIGATION IR SOLN
Status: DC | PRN
  Administered 2018-11-13: 11:00:00

## 2018-11-13 NOTE — Op Note (Signed)
[facility] Patient Name: Ryan Fuller Procedure Date: 11/13/2018 10:34 AM MRN: [id number] Date of Birth: [birth year] Attending MD: Hildred Laser , MD CSN: [id number] Age: 52 Admit Type: Outpatient Procedure:                Colonoscopy Indications:              High risk colon cancer surveillance: Personal                            history of colonic polyps Providers:                Hildred Laser, MD, Otis Peak B. Sharon Seller, RN, Randa Spike, Technician Referring MD:             Halford Chessman, MD Medicines:                Meperidine 50 mg IV, Midazolam 7 mg IV Complications:            No immediate complications. Estimated Blood Loss:     Estimated blood loss was minimal. Procedure:                Pre-Anesthesia Assessment:                           - Prior to the procedure, a History and Physical                            was performed, and patient medications and                            allergies were reviewed. The patient's tolerance of                            previous anesthesia was also reviewed. The risks                            and benefits of the procedure and the sedation                            options and risks were discussed with the patient.                            All questions were answered, and informed consent                            was obtained. Prior Anticoagulants: The patient                            last took previous NSAID medication 1 day prior to                            the procedure. ASA Grade Assessment: II - A patient  with mild systemic disease. After reviewing the                            risks and benefits, the patient was deemed in                            satisfactory condition to undergo the procedure.                           After obtaining informed consent, the colonoscope                            was passed under direct vision. Throughout the               procedure, the patient's blood pressure, pulse, and                            oxygen saturations were monitored continuously. The                            [id number]-[id number] ([id number]) scope was introduced through                            the anus and advanced to the the terminal ileum,                            with identification of the appendiceal orifice and                            IC valve. The colonoscopy was performed without                            difficulty. The patient tolerated the procedure                            well. The quality of the bowel preparation was                            good. The terminal ileum, ileocecal valve,                            appendiceal orifice, and rectum were photographed. Scope In: 11:00:38 AM Scope Out: 11:31:12 AM Scope Withdrawal Time: 0 hours 24 minutes 33 seconds  Total Procedure Duration: 0 hours 30 minutes 34 seconds  Findings:      The perianal and digital rectal examinations were normal.      The distal ileum contained a single erosion. Biopsies were taken with a       cold forceps for histology. The pathology specimen was placed into       Bottle Number 1.      Two small ulcers were found in the transverse colon. No bleeding was       present. Biopsies were taken with a cold forceps for histology. The       pathology specimen was placed into Bottle Number 2.  Two sessile polyps were found in the descending colon and transverse       colon. The polyps were small in size. These were biopsied with a cold       forceps for histology. The pathology specimen was placed into Bottle       Number 3.      Scattered small-mouthed diverticula were found in the sigmoid colon and       splenic flexure.      External and internal hemorrhoids were found during retroflexion. The       hemorrhoids were medium-sized. Impression:               - A single erosion in the distal ileum. Biopsied.                           - Two  small ulcers in the transverse colon.                            Biopsied.                           - Two small polyps in the descending colon and in                            the transverse colon. Biopsied.                           - Diverticulosis in the sigmoid colon and at the                            splenic flexure.                           - External and internal hemorrhoids. Moderate Sedation:      Moderate (conscious) sedation was administered by the endoscopy nurse       and supervised by the endoscopist. The following parameters were       monitored: oxygen saturation, heart rate, blood pressure, CO2       capnography and response to care. Total physician intraservice time was       36 minutes. Recommendation:           - Patient has a contact number available for                            emergencies. The signs and symptoms of potential                            delayed complications were discussed with the                            patient. Return to normal activities tomorrow.                            Written discharge instructions were provided to the                            patient.                           -  High fiber diet today.                           - Continue present medications but use Meloxicam as                            needed if possible.                           - No aspirin, ibuprofen, naproxen, or other                            non-steroidal anti-inflammatory drugs for 1 day.                           - Await pathology results.                           - Repeat colonoscopy in 5 years for surveillance. Procedure Code(s):        --- Professional ---                           [phone], Colonoscopy, flexible; with biopsy, single                            or multiple                           99153, Moderate sedation; each additional 15                            minutes intraservice time                           G0500, Moderate sedation  services provided by the                            same physician or other qualified health care                            professional performing a gastrointestinal                            endoscopic service that sedation supports,                            requiring the presence of an independent trained                            observer to assist in the monitoring of the                            patient's level of consciousness and physiological                            status; initial 15 minutes of intra-service time;  patient age 56 years or older (additional time may                            be reported with [phone], as appropriate) Diagnosis Code(s):        --- Professional ---                           K63.3, Ulcer of intestine                           Z86.010, Personal history of colonic polyps                           D12.4, Benign neoplasm of descending colon                           D12.3, Benign neoplasm of transverse colon (hepatic                            flexure or splenic flexure)                           K64.8, Other hemorrhoids                           K57.30, Diverticulosis of large intestine without                            perforation or abscess without bleeding CPT copyright 2018 American Medical Association. All rights reserved. The codes documented in this report are preliminary and upon coder review may  be revised to meet current compliance requirements. Hildred Laser, MD Hildred Laser, MD 11/13/2018 11:45:08 AM This report has been signed electronically. Number of Addenda: 0

## 2018-11-13 NOTE — H&P (Addendum)
Ryan Fuller is an 52 y.o. male.   Chief Complaint: Patient is here for colonoscopy. HPI: Patient is 52 year old Caucasian male who has history of tubular adenomas and is here for surveillance colonoscopy.  His last exam was in April 2014.  He has had problems with constipation and is on fiber supplement as well as polyethylene glycol.  Feels is not working as good as he used to.  He did have an episode of rectal bleeding with his bowel movement about 3 weeks ago.  This is presumed to be hemorrhoidal bleeding. [name] history significant for colon carcinoma in maternal aunt who was in late 48s and is doing fine at age 51. He does not take aspirin but takes meloxicam.  Past Medical History:  Diagnosis Date  . Dysphagia 08/10/2017  . Gout   . Gout 08/10/2017  . Kidney stones     Past Surgical History:  Procedure Laterality Date  . BACK SURGERY    . COLONOSCOPY N/A 03/01/2013   Procedure: COLONOSCOPY;  Surgeon: Rogene Houston, MD;  Location: [facility];  Service: Endoscopy;  Laterality: N/A;  100  . EGD/ED     years ago  . ESOPHAGEAL DILATION N/A 08/31/2017   Procedure: ESOPHAGEAL DILATION;  Surgeon: Rogene Houston, MD;  Location: [facility];  Service: Endoscopy;  Laterality: N/A;  . ESOPHAGOGASTRODUODENOSCOPY N/A 08/31/2017   Procedure: ESOPHAGOGASTRODUODENOSCOPY (EGD);  Surgeon: Rogene Houston, MD;  Location: [facility];  Service: Endoscopy;  Laterality: N/A;  3:10  . Rotator cuff surgery      Family History  Problem Relation Age of Onset  . Colon cancer Maternal Aunt    Social History:  reports that he has never smoked. He has never used smokeless tobacco. He reports that he does not drink alcohol or use drugs.  Allergies:  Allergies  Allergen Reactions  . Phenergan [Promethazine Hcl] Anaphylaxis and Other (See Comments)    ?severe hypotension  . Colchicine Other (See Comments)    Depression    . Penicillins Other (See Comments)    From childhood Has patient  had a PCN reaction causing immediate rash, facial/tongue/throat swelling, SOB or lightheadedness with hypotension: Unknown Has patient had a PCN reaction causing severe rash involving mucus membranes or skin necrosis: Unknown Has patient had a PCN reaction that required hospitalization Unknown Has patient had a PCN reaction occurring within the last 10 years: Unknown If all of the above answers are "NO", then may proceed with Cephalosporin [place]  . Vancomycin Swelling and Other (See Comments)    Lips swelling    Medications Prior to Admission  Medication Sig Dispense Refill  . allopurinol (ZYLOPRIM) 100 MG tablet Take 100 mg by mouth daily.    . famotidine (PEPCID) 10 MG tablet Take 10 mg by mouth daily.    Marland Kitchen HYDROcodone-acetaminophen (NORCO/VICODIN) 5-325 MG tablet Take 1-2 tablets by mouth every 6 (six) hours as needed for moderate pain.     Marland Kitchen lansoprazole (PREVACID) 30 MG capsule TAKE ONE CAPSULE BY MOUTH ONCE DAILY. (Patient taking differently: Take 30 mg by mouth daily. ) 30 capsule 5  . meloxicam (MOBIC) 15 MG tablet Take 15 mg by mouth daily.    . polyethylene glycol (MIRALAX / GLYCOLAX) packet Take 17 g by mouth every morning.    . Psyllium (METAMUCIL FREE & NATURAL PO) Take 2 scoop by mouth at bedtime.      No results found for this or any previous visit (from the past 48 hour(s)).  No results found.  ROS  Blood pressure 101/71, pulse 64, temperature 98.1 F (36.7 C), temperature source Oral, resp. rate 18, SpO2 97 %. Physical Exam  Constitutional: He appears well-developed and well-nourished.  HENT:  Mouth/Throat: Oropharynx is clear and moist.  Eyes: Conjunctivae are normal. No scleral icterus.  Neck: No thyromegaly present.  Cardiovascular: Normal rate, regular rhythm and normal heart sounds.  No murmur heard. Respiratory: Effort normal and breath sounds normal.  GI:  Abdomen is full with laparoscopy scars from prior cholecystectomy.  Abdomen is soft and nontender with  organomegaly or masses.  Musculoskeletal:        General: No edema.  Lymphadenopathy:    He has no cervical adenopathy.  Neurological: He is alert.  Skin: Skin is warm and dry.     Assessment/Plan History of colonic adenomas. Recent episode of hematochezia felt to be due to hemorrhoids. Surveillance colonoscopy.  Hildred Laser, MD 11/13/2018, 10:50 AM

## 2018-11-13 NOTE — Discharge Instructions (Signed)
No aspirin or NSAIDs for 24 hours. If possible take meloxicam on as-needed basis. Resume other medications as before. High-fiber diet. No driving for 24 hours. Physician will call with biopsy results.  PATIENT INSTRUCTIONS POST-ANESTHESIA  IMMEDIATELY FOLLOWING SURGERY:  Do not drive or operate machinery for the first twenty four hours after surgery.  Do not make any important decisions for twenty four hours after surgery or while taking narcotic pain medications or sedatives.  If you develop intractable nausea and vomiting or a severe headache please notify your doctor immediately.  FOLLOW-UP:  Please make an appointment with your surgeon as instructed. You do not need to follow up with anesthesia unless specifically instructed to do so.  WOUND CARE INSTRUCTIONS (if applicable):  Keep a dry clean dressing on the anesthesia/puncture wound site if there is drainage.  Once the wound has quit draining you may leave it open to air.  Generally you should leave the bandage intact for twenty four hours unless there is drainage.  If the epidural site drains for more than 36-48 hours please call the anesthesia department.  QUESTIONS?:  Please feel free to call your physician or the hospital operator if you have any questions, and they will be happy to assist you.      Colonoscopy, Adult, Care After This sheet gives you information about how to care for yourself after your procedure. Your doctor may also give you more specific instructions. If you have problems or questions, call your doctor. What can I expect after the procedure? After the procedure, it is common to have:  A small amount of blood in your poop for 24 hours.  Some gas.  Mild cramping or bloating in your belly. Follow these instructions at home: General instructions  For the first 24 hours after the procedure: ? Do not drive or use machinery. ? Do not sign important documents. ? Do not drink alcohol. ? Do your daily  activities more slowly than normal. ? Eat foods that are soft and easy to digest.  Take over-the-counter or prescription medicines only as told by your doctor. To help cramping and bloating:   Try walking around.  Put heat on your belly (abdomen) as told by your doctor. Use a heat source that your doctor recommends, such as a moist heat pack or a heating pad. ? Put a towel between your skin and the heat source. ? Leave the heat on for 20-30 minutes. ? Remove the heat if your skin turns bright red. This is especially important if you cannot feel pain, heat, or cold. You can get burned. Eating and drinking   Drink enough fluid to keep your pee (urine) clear or pale yellow.  Return to your normal diet as told by your doctor. Avoid heavy or fried foods that are hard to digest.  Avoid drinking alcohol for as long as told by your doctor. Contact a doctor if:  You have blood in your poop (stool) 2-3 days after the procedure. Get help right away if:  You have more than a small amount of blood in your poop.  You see large clumps of tissue (blood clots) in your poop.  Your belly is swollen.  You feel sick to your stomach (nauseous).  You throw up (vomit).  You have a fever.  You have belly pain that gets worse, and medicine does not help your pain. Summary  After the procedure, it is common to have a small amount of blood in your poop. You may also  have mild cramping and bloating in your belly.  For the first 24 hours after the procedure, do not drive or use machinery, do not sign important documents, and do not drink alcohol.  Get help right away if you have a lot of blood in your poop, feel sick to your stomach, have a fever, or have more belly pain. This information is not intended to replace advice given to you by your health care provider. Make sure you discuss any questions you have with your health care provider. Document Released: 12/04/2010 Document Revised: 09/01/2017  Document Reviewed: 07/26/2016 Elsevier Interactive Patient Education  2019 [employer].    Colon Polyps  Polyps are tissue growths inside the body. Polyps can grow in many places, including the large intestine (colon). A polyp may be a round bump or a mushroom-shaped growth. You could have one polyp or several. Most colon polyps are noncancerous (benign). However, some colon polyps can become cancerous over time. Finding and removing the polyps early can help prevent this. What are the causes? The exact cause of colon polyps is not known. What increases the risk? You are more likely to develop this condition if you:  Have a family history of colon cancer or colon polyps.  Are older than 67 or older than 45 if you are African American.  Have inflammatory bowel disease, such as ulcerative colitis or Crohn's disease.  Have certain hereditary conditions, such as: ? Familial adenomatous polyposis. ? Lynch syndrome. ? Turcot syndrome. ? Peutz-Jeghers syndrome.  Are overweight.  Smoke cigarettes.  Do not get enough exercise.  Drink too much alcohol.  Eat a diet that is high in fat and red meat and low in fiber.  Had childhood cancer that was treated with abdominal radiation. What are the signs or symptoms? Most polyps do not cause symptoms. If you have symptoms, they may include:  Blood coming from your rectum when having a bowel movement.  Blood in your stool. The stool may look dark red or black.  Abdominal pain.  A change in bowel habits, such as constipation or diarrhea. How is this diagnosed? This condition is diagnosed with a colonoscopy. This is a procedure in which a lighted, flexible scope is inserted into the anus and then passed into the colon to examine the area. Polyps are sometimes found when a colonoscopy is done as part of routine cancer screening tests. How is this treated? Treatment for this condition involves removing any polyps that are found. Most  polyps can be removed during a colonoscopy. Those polyps will then be tested for cancer. Additional treatment may be needed depending on the results of testing. Follow these instructions at home: Lifestyle  Maintain a healthy weight, or lose weight if recommended by your health care provider.  Exercise every day or as told by your health care provider.  Do not use any products that contain nicotine or tobacco, such as cigarettes and e-cigarettes. If you need help quitting, ask your health care provider.  If you drink alcohol, limit how much you have: ? 0-1 drink a day for women. ? 0-2 drinks a day for men.  Be aware of how much alcohol is in your drink. In the [place] one drink equals one 12 oz bottle of beer (355 mL), one 5 oz glass of wine (148 mL), or one 1 oz shot of hard liquor (44 mL). Eating and drinking   Eat foods that are high in fiber, such as fruits, vegetables, and whole grains.  Eat foods that are high in calcium and vitamin D, such as milk, cheese, yogurt, eggs, liver, fish, and broccoli.  Limit foods that are high in fat, such as fried foods and desserts.  Limit the amount of red meat and processed meat you eat, such as hot dogs, sausage, bacon, and lunch meats. General instructions  Keep all follow-up visits as told by your health care provider. This is important. ? This includes having regularly scheduled colonoscopies. ? Talk to your health care provider about when you need a colonoscopy. Contact a health care provider if:  You have new or worsening bleeding during a bowel movement.  You have new or increased blood in your stool.  You have a change in bowel habits.  You lose weight for no known reason. Summary  Polyps are tissue growths inside the body. Polyps can grow in many places, including the colon.  Most colon polyps are noncancerous (benign), but some can become cancerous over time.  This condition is diagnosed with a colonoscopy.  Treatment  for this condition involves removing any polyps that are found. Most polyps can be removed during a colonoscopy. This information is not intended to replace advice given to you by your health care provider. Make sure you discuss any questions you have with your health care provider. Document Released: 07/28/2004 Document Revised: 02/16/2018 Document Reviewed: 02/16/2018 Elsevier Interactive Patient Education  2019 [employer].    Diverticulosis  Diverticulosis is a condition that develops when small pouches (diverticula) form in the wall of the large intestine (colon). The colon is where water is absorbed and stool is formed. The pouches form when the inside layer of the colon pushes through weak spots in the outer layers of the colon. You may have a few pouches or many of them. What are the causes? The cause of this condition is not known. What increases the risk? The following factors may make you more likely to develop this condition:  Being older than age 60. Your risk for this condition increases with age. Diverticulosis is rare among people younger than age 64. By age 28, many people have it.  Eating a low-fiber diet.  Having frequent constipation.  Being overweight.  Not getting enough exercise.  Smoking.  Taking over-the-counter pain medicines, like aspirin and ibuprofen.  Having a family history of diverticulosis. What are the signs or symptoms? In most people, there are no symptoms of this condition. If you do have symptoms, they may include:  Bloating.  Cramps in the abdomen.  Constipation or diarrhea.  Pain in the lower left side of the abdomen. How is this diagnosed? This condition is most often diagnosed during an exam for other colon problems. Because diverticulosis usually has no symptoms, it often cannot be diagnosed independently. This condition may be diagnosed by:  Using a flexible scope to examine the colon (colonoscopy).  Taking an X-ray of the  colon after dye has been put into the colon (barium enema).  Doing a CT scan. How is this treated? You may not need treatment for this condition if you have never developed an infection related to diverticulosis. If you have had an infection before, treatment may include:  Eating a high-fiber diet. This may include eating more fruits, vegetables, and grains.  Taking a fiber supplement.  Taking a live bacteria supplement (probiotic).  Taking medicine to relax your colon.  Taking antibiotic medicines. Follow these instructions at home:  Drink 6-8 glasses of water or more each day to prevent  constipation.  Try not to strain when you have a bowel movement.  If you have had an infection before: ? Eat more fiber as directed by your health care provider or your diet and nutrition specialist (dietitian). ? Take a fiber supplement or probiotic, if your health care provider approves.  Take over-the-counter and prescription medicines only as told by your health care provider.  If you were prescribed an antibiotic, take it as told by your health care provider. Do not stop taking the antibiotic even if you start to feel better.  Keep all follow-up visits as told by your health care provider. This is important. Contact a health care provider if:  You have pain in your abdomen.  You have bloating.  You have cramps.  You have not had a bowel movement in 3 days. Get help right away if:  Your pain gets worse.  Your bloating becomes very bad.  You have a fever or chills, and your symptoms suddenly get worse.  You vomit.  You have bowel movements that are bloody or black.  You have bleeding from your rectum. Summary  Diverticulosis is a condition that develops when small pouches (diverticula) form in the wall of the large intestine (colon).  You may have a few pouches or many of them.  This condition is most often diagnosed during an exam for other colon problems.  If you have  had an infection related to diverticulosis, treatment may include increasing the fiber in your diet, taking supplements, or taking medicines. This information is not intended to replace advice given to you by your health care provider. Make sure you discuss any questions you have with your health care provider. Document Released: 07/29/2004 Document Revised: 09/20/2016 Document Reviewed: 09/20/2016 Elsevier Interactive Patient Education  2019 [employer]   Hemorrhoids Hemorrhoids are swollen veins that may develop:  In the butt (rectum). These are called internal hemorrhoids.  Around the opening of the butt (anus). These are called external hemorrhoids. Hemorrhoids can cause pain, itching, or bleeding. Most of the time, they do not cause serious problems. They usually get better with diet changes, lifestyle changes, and other home treatments. What are the causes? This condition may be caused by:  Having trouble pooping (constipation).  Pushing hard (straining) to poop.  Watery poop (diarrhea).  Pregnancy.  Being very overweight (obese).  Sitting for long periods of time.  Heavy lifting or other activity that causes you to strain.  Anal sex.  Riding a bike for a long period of time. What are the signs or symptoms? Symptoms of this condition include:  Pain.  Itching or soreness in the butt.  Bleeding from the butt.  Leaking poop.  Swelling in the area.  One or more lumps around the opening of your butt. How is this diagnosed? A doctor can often diagnose this condition by looking at the affected area. The doctor may also:  Do an exam that involves feeling the area with a gloved hand (digital rectal exam).  Examine the area inside your butt using a small tube (anoscope).  Order blood tests. This may be done if you have lost a lot of blood.  Have you get a test that involves looking inside the colon using a flexible tube with a camera on the end (sigmoidoscopy or  colonoscopy). How is this treated? This condition can usually be treated at home. Your doctor may tell you to change what you eat, make lifestyle changes, or try home treatments. If these do  not help, procedures can be done to remove the hemorrhoids or make them smaller. These may involve:  Placing rubber bands at the base of the hemorrhoids to cut off their blood supply.  Injecting medicine into the hemorrhoids to shrink them.  Shining a type of light energy onto the hemorrhoids to cause them to fall off.  Doing surgery to remove the hemorrhoids or cut off their blood supply. Follow these instructions at home: Eating and drinking   Eat foods that have a lot of fiber in them. These include whole grains, beans, nuts, fruits, and vegetables.  Ask your doctor about taking products that have added fiber (fibersupplements).  Reduce the amount of fat in your diet. You can do this by: ? Eating low-fat dairy products. ? Eating less red meat. ? Avoiding processed foods.  Drink enough fluid to keep your pee (urine) pale yellow. Managing pain and swelling   Take a warm-water bath (sitz bath) for 20 minutes to ease pain. Do this 3-4 times a day. You may do this in a bathtub or using a portable sitz bath that fits over the toilet.  If told, put ice on the painful area. It may be helpful to use ice between your warm baths. ? Put ice in a plastic bag. ? Place a towel between your skin and the bag. ? Leave the ice on for 20 minutes, 2-3 times a day. General instructions  Take over-the-counter and prescription medicines only as told by your doctor. ? Medicated creams and medicines may be used as told.  Exercise often. Ask your doctor how much and what kind of exercise is best for you.  Go to the bathroom when you have the urge to poop. Do not wait.  Avoid pushing too hard when you poop.  Keep your butt dry and clean. Use wet toilet paper or moist towelettes after pooping.  Do not sit on  the toilet for a long time.  Keep all follow-up visits as told by your doctor. This is important. Contact a doctor if you:  Have pain and swelling that do not get better with treatment or medicine.  Have trouble pooping.  Cannot poop.  Have pain or swelling outside the area of the hemorrhoids. Get help right away if you have:  Bleeding that will not stop. Summary  Hemorrhoids are swollen veins in the butt or around the opening of the butt.  They can cause pain, itching, or bleeding.  Eat foods that have a lot of fiber in them. These include whole grains, beans, nuts, fruits, and vegetables.  Take a warm-water bath (sitz bath) for 20 minutes to ease pain. Do this 3-4 times a day. This information is not intended to replace advice given to you by your health care provider. Make sure you discuss any questions you have with your health care provider. Document Released: 08/10/2008 Document Revised: 03/23/2018 Document Reviewed: 03/23/2018 Elsevier Interactive Patient Education  2019 [employer].

## 2018-11-20 ENCOUNTER — Encounter (HOSPITAL_COMMUNITY): Payer: Self-pay | Admitting: Internal Medicine

## 2019-01-12 ENCOUNTER — Other Ambulatory Visit (HOSPITAL_COMMUNITY): Payer: Self-pay | Admitting: Neurological Surgery

## 2019-01-12 DIAGNOSIS — R1310 Dysphagia, unspecified: Secondary | ICD-10-CM

## 2019-01-16 ENCOUNTER — Ambulatory Visit (HOSPITAL_COMMUNITY)
Admission: RE | Admit: 2019-01-16 | Discharge: 2019-01-16 | Disposition: A | Discharge status: Home / Self Care | Payer: Commercial Managed Care - PPO | Source: Ambulatory Visit | Attending: Neurological Surgery | Admitting: Neurological Surgery

## 2019-01-16 DIAGNOSIS — R1310 Dysphagia, unspecified: Secondary | ICD-10-CM | POA: Diagnosis present

## 2019-05-01 ENCOUNTER — Other Ambulatory Visit (INDEPENDENT_AMBULATORY_CARE_PROVIDER_SITE_OTHER): Payer: Self-pay | Admitting: Internal Medicine

## 2019-05-01 NOTE — Telephone Encounter (Signed)
Patient will need to have OV prior to further refills. He has a 6 month supply.

## 2019-10-01 ENCOUNTER — Other Ambulatory Visit: Payer: Self-pay

## 2019-10-01 ENCOUNTER — Encounter (INDEPENDENT_AMBULATORY_CARE_PROVIDER_SITE_OTHER): Payer: Self-pay | Admitting: Internal Medicine

## 2019-10-01 ENCOUNTER — Ambulatory Visit (INDEPENDENT_AMBULATORY_CARE_PROVIDER_SITE_OTHER): Payer: Commercial Managed Care - PPO | Admitting: Internal Medicine

## 2019-10-01 VITALS — BP 119/77 | HR 92 | Temp 98.6°F | Ht 72.0 in | Wt 230.1 lb

## 2019-10-01 DIAGNOSIS — R1310 Dysphagia, unspecified: Secondary | ICD-10-CM | POA: Diagnosis not present

## 2019-10-01 DIAGNOSIS — K5732 Diverticulitis of large intestine without perforation or abscess without bleeding: Secondary | ICD-10-CM | POA: Diagnosis not present

## 2019-10-01 DIAGNOSIS — R1319 Other dysphagia: Secondary | ICD-10-CM

## 2019-10-01 NOTE — Patient Instructions (Signed)
High-fiber diet.  Remember to chew your food thoroughly for swallowing. Please call office if abdominal pain not resolved by the time you finish antibiotic. Notify if swallowing difficulty worsens in which case we will consider repeating barium study.

## 2019-10-01 NOTE — Progress Notes (Signed)
Presenting complaint;  Left lower quadrant abdominal pain.  Database and subjective:  Patient is 53 year old Caucasian male who presents with left lower quadrant abdominal pain.  She states she was diagnosed with sigmoid diverticulitis by Dr. Gerarda Fraction 4 days ago and was begun on Cipro and metronidazole.  He states he had pain for 4 days.  Pain was initially mild and then increased in intensity by the time he saw Dr. Gerarda Fraction.  He had nausea but no vomiting fever or chills.  He has never been diagnosed with diverticulitis in the past. He has a history of sigmoid diverticulosis noted on colonoscopy of December 2019. He now has soreness in left lower quadrant abdomen and remains with some nausea.  His bowels are back to normal.  He did not eat well for few days but now his appetite is back.  He states heartburn is well controlled with therapy. He continues to complain of dysphagia to solids.  He states he had an episode of food impaction when his wife performed Heimlich maneuver with resolution.  He states his wife works for [employer] and is very familiar with this maneuver. He has history of dysphagia.  He underwent EGD back in October 2018 when he was noted to have small sliding hiatal hernia otherwise normal exam and his esophagus was dilated by passing 54 and 56 [place] Maloney dilator.  He says his dysphagia got worse after his recent neck surgery earlier this year.  He had a esophagogram in March 2020 revealing prominent cricopharyngeus but barium pill but barium pill passed from oral cavity stomach without any delay. He does not take NSAIDs anymore. He has chronic neck and low back pain. He says he had lumbar spine surgery 5 years ago which was complicated by infection and he had to have a PICC line for antibiotics.  He has had 2 neck surgery.  First surgery was few years ago and second surgery earlier this year.  Patient's last colonoscopy was in December 2019 because he has history of  colonic polyps.  He had 2 small tubular adenomas removed.  He had 2 also the transverse colon and biopsy revealed nonspecific inflammation most likely due to NSAIDs which were discontinued.  He also had scattered diverticula at splenic flexure and sigmoid colon.  Family history is positive for colon carcinoma in maternal aunt who was 61 at the time of diagnosis and doing fine 10 years later.  Current Medications: Outpatient Encounter Medications as of 10/01/2019  Medication Sig  . allopurinol (ZYLOPRIM) 100 MG tablet Take 100 mg by mouth daily.  . ciprofloxacin (CIPRO) 500 MG tablet Take 500 mg by mouth 2 (two) times daily.  . famotidine (PEPCID) 10 MG tablet Take 10 mg by mouth daily.  . lansoprazole (PREVACID) 30 MG capsule TAKE ONE CAPSULE BY MOUTH ONCE DAILY.  . meloxicam (MOBIC) 15 MG tablet Take 1 tablet (15 mg total) by mouth daily as needed for pain.  . metroNIDAZOLE (FLAGYL) 500 MG tablet Take 500 mg by mouth 2 (two) times daily.  . polyethylene glycol (MIRALAX / GLYCOLAX) packet Take 17 g by mouth every morning.  . Psyllium (METAMUCIL FREE & NATURAL PO) Take 2 scoop by mouth at bedtime.  . traMADol (ULTRAM) 50 MG tablet Take 50 mg by mouth every 4 (four) hours. Patient may take up to 6 per day.  . [DISCONTINUED] HYDROcodone-acetaminophen (NORCO/VICODIN) 5-325 MG tablet Take 1-2 tablets by mouth every 6 (six) hours as needed for moderate pain.    No facility-administered  encounter medications on file as of 10/01/2019.      Objective: Blood pressure 119/77, pulse 92, temperature 98.6 F (37 C), temperature source Oral, height 6' (1.829 m), weight 230 lb 1.6 oz (104.4 kg). Patient is alert and in no acute distress. He is wearing facial mask. Conjunctiva is pink. Sclera is nonicteric Oropharyngeal mucosa is normal. No neck masses or thyromegaly noted. Cardiac exam with regular rhythm normal S1 and S2. No murmur or gallop noted. Lungs are clear to auscultation. Abdomen abdomen is  protuberant.  Bowel sounds are normal.  On palpation abdomen is soft.  He has mild tenderness at LLQ without guarding.  No organomegaly or masses. No LE edema or clubbing noted.  Labs/studies Results:  Barium swallow from 01/16/2019 reviewed.  Very prominent cricopharyngeus with focal decrease in lumen.   Assessment:  #1.  Sigmoid diverticulitis.  Patient was begun on Cipro and metronidazole 4 days ago and he is starting to feel better.  This is patient's first episode.  Since he had colonoscopy in December last year documenting sigmoid and splenic flexure diverticulosis he does not need to have repeat exam.  He should continue antibiotics for total of 10 days.  If pain is not resolved by then he will call office and antibiotics would be continued for another 4 to 7 days.  #2.  Esophageal dysphagia.  Dysphagia appears to be related to his neck surgery.  He seems to be coping with it.  If he keeps having spells of esophageal obstruction would consider repeat dilation.  He is aware that that he should eat slowly and chew his food thoroughly.  #3.  GERD.  Heartburn well controlled with therapy.  Plan:  Patient advised to call office if he remains with residual pain when he is close to finishing antibiotic. Patient can go back to high-fiber diet. He should refrain from taking NSAIDs unless absolutely necessary. In the future if he has symptoms suggestive of diverticulitis he should not wait 4 days before he seeks help. His next surveillance colonoscopy would be in December 2024. He will return to our office on as-needed basis.

## 2019-10-08 ENCOUNTER — Ambulatory Visit (INDEPENDENT_AMBULATORY_CARE_PROVIDER_SITE_OTHER): Payer: Commercial Managed Care - PPO | Admitting: Internal Medicine

## 2019-10-30 ENCOUNTER — Other Ambulatory Visit (INDEPENDENT_AMBULATORY_CARE_PROVIDER_SITE_OTHER): Payer: Self-pay | Admitting: Internal Medicine

## 2020-02-21 ENCOUNTER — Ambulatory Visit (HOSPITAL_COMMUNITY)
Admission: RE | Admit: 2020-02-21 | Discharge: 2020-02-21 | Disposition: A | Discharge status: Home / Self Care | Payer: Commercial Managed Care - PPO | Source: Ambulatory Visit | Attending: Physician Assistant | Admitting: Physician Assistant

## 2020-02-21 ENCOUNTER — Other Ambulatory Visit: Payer: Self-pay

## 2020-02-21 ENCOUNTER — Other Ambulatory Visit (HOSPITAL_COMMUNITY): Payer: Self-pay | Admitting: Physician Assistant

## 2020-02-21 DIAGNOSIS — M25561 Pain in right knee: Secondary | ICD-10-CM | POA: Insufficient documentation

## 2020-03-27 ENCOUNTER — Other Ambulatory Visit (HOSPITAL_COMMUNITY): Payer: Self-pay | Admitting: Neurological Surgery

## 2020-03-27 DIAGNOSIS — R200 Anesthesia of skin: Secondary | ICD-10-CM

## 2020-03-27 DIAGNOSIS — R29898 Other symptoms and signs involving the musculoskeletal system: Secondary | ICD-10-CM

## 2020-03-27 DIAGNOSIS — M4722 Other spondylosis with radiculopathy, cervical region: Secondary | ICD-10-CM

## 2020-03-27 DIAGNOSIS — R202 Paresthesia of skin: Secondary | ICD-10-CM

## 2020-04-09 ENCOUNTER — Ambulatory Visit (HOSPITAL_COMMUNITY)
Admission: RE | Admit: 2020-04-09 | Discharge: 2020-04-09 | Disposition: A | Discharge status: Home / Self Care | Payer: Commercial Managed Care - PPO | Source: Ambulatory Visit | Attending: Neurological Surgery | Admitting: Neurological Surgery

## 2020-04-09 ENCOUNTER — Other Ambulatory Visit: Payer: Self-pay

## 2020-04-09 DIAGNOSIS — R200 Anesthesia of skin: Secondary | ICD-10-CM | POA: Diagnosis present

## 2020-04-09 DIAGNOSIS — R29898 Other symptoms and signs involving the musculoskeletal system: Secondary | ICD-10-CM

## 2020-04-09 DIAGNOSIS — M4722 Other spondylosis with radiculopathy, cervical region: Secondary | ICD-10-CM | POA: Diagnosis not present

## 2020-04-09 DIAGNOSIS — R202 Paresthesia of skin: Secondary | ICD-10-CM

## 2020-04-09 MED ORDER — OXYCODONE-ACETAMINOPHEN 5-325 MG PO TABS
ORAL_TABLET | ORAL | Status: AC
  Filled 2020-04-09: qty 1

## 2020-04-09 MED ORDER — ONDANSETRON HCL 4 MG/2ML IJ SOLN: 4.0000 mg | Freq: Four times a day (QID) | INTRAMUSCULAR | Status: DC | PRN

## 2020-04-09 MED ORDER — OXYCODONE-ACETAMINOPHEN 5-325 MG PO TABS
1.0000 | ORAL_TABLET | ORAL | Status: DC | PRN
  Administered 2020-04-09: 1 via ORAL

## 2020-04-09 MED ORDER — LIDOCAINE HCL (PF) 1 % IJ SOLN
5.0000 mL | Freq: Once | INTRAMUSCULAR | Status: AC
  Administered 2020-04-09: 5 mL via INTRADERMAL

## 2020-04-09 MED ORDER — IOHEXOL 300 MG/ML  SOLN
10.0000 mL | Freq: Once | INTRAMUSCULAR | Status: AC | PRN
  Administered 2020-04-09: 7 mL via INTRATHECAL

## 2020-04-09 NOTE — Progress Notes (Signed)
Radiology // [facility], aware pt does not have orders/ they will take pt and get consent.

## 2020-04-09 NOTE — Procedures (Signed)
Ryan Fuller is a 54 year old individual whose had significant cervical spondylitic disease having had previous fusions at C5-6 and C6-7.  He has developed left cervical radicular pain in the neck shoulder and arm he is also had chronic right lumbar radiculopathy and has had MRIs in the past that demonstrate spondylosis is also had lumbar decompression in the past.  Because of his chronic pain both these areas and weakness that he now has in the left upper extremity have advised a myelogram and a post myelogram CAT scan to better evaluate both his neck and his lumbar spine.  Pre op Dx: Spondylosis cervical and lumbar with radiculopathy Post op Dx: Same Procedure: Total myelogram Surgeon: Ellene Route Puncture level: L3-4 Fluid color: Clear colorless Injection: Iohexol 300, 7 cc Findings: Cervical stenosis of moderate severity with further evaluation on CT scanning lumbar spine shows spondylitic disease in the lower lumbar levels further evaluation with CT scanning also

## 2020-04-09 NOTE — Discharge Instructions (Signed)
Myelogram  A myelogram is an imaging study of the spinal cord and the places where nerves attach to the spinal cord (nerve roots). A dye (contrast material) is injected into the spine before the X-ray. This provides a clearer image for your health care provider to see. You may need this study done if you have a spinal cord problem that cannot be diagnosed with other imaging studies, such as a CT scan or an MRI. You may also have this study to check your spine after surgery. Tell a health care provider about:  Any allergies you have, especially to iodine.  All medicines you are taking, including vitamins, herbs, eye drops, creams, and over-the-counter medicines.  Any problems you or family members have had with anesthetic medicines or contrast material.  Any blood disorders you have.  Any surgeries you have had.  Any medical conditions you have or have had, including asthma.  Whether you are pregnant or may be pregnant. What are the risks? Generally, this is a safe procedure. However, problems may occur, including:  Infection.  Bleeding.  Allergic reaction to medicines or dyes.  Damage to your spinal cord or nerves.  Loss or leaking of spinal fluid. This can lead to headaches.  Damage to kidneys.  Seizures. This is rare. What happens before the procedure?  Follow instructions from your health care provider about eating or drinking restrictions. You may be asked to drink more fluids.  Ask your health care provider about changing or stopping your regular medicines. This is especially important if you are taking diabetes medicines or blood thinners.  Plan to have someone take you home from the hospital or clinic.  If you will be going home right after the procedure, plan to have someone with you for 24 hours. What happens during the procedure?  You will lie face down on a table.  Your health care provider will locate the best injection site on your spine. This is most  often in the lower back.  The area of injection will be washed with soap.  You will be given a medicine to numb the area (local anesthetic).  Your health care provider will insert a long needle into the space around your spinal cord (subarachnoid space).  A sample of spinal fluid may be taken and sent to the lab for testing.  The contrast material will be injected into the subarachnoid space.  The exam table may be tilted to help the contrast material flow up or down your spine.  The X-ray will take images of your spinal cord for your health care provider to examine.  A bandage (dressing) may be placed over the injection site. The procedure may vary among health care providers and hospitals. What can I expect after this procedure?  Your blood pressure, heart rate, breathing rate, and blood oxygen level may be monitored until you leave the hospital or clinic.  You may have: ? Soreness on your injection site. ? A mild headache.  You will be asked to lie down with your head raised (elevated). This reduces the risk of a headache.  It is up to you to get the results of your procedure. Ask your health care provider, or the department that is doing the procedure, when your results will be ready. Follow these instructions at home:   Rest as told by your health care provider. Lie flat with your head slightly elevated to reduce the risk of a headache.  Do not bend, lift, or do hard work   for 24-48 hours, or as told by your health care provider.  Take over-the-counter and prescription medicines only as told by your health care provider.  Take care of your dressing as told by your health care provider.  Drink enough fluid to keep your urine pale yellow.  Bathe or shower as told by your health care provider. Contact a health care provider if:  You have a fever.  You have a headache that lasts longer than 24 hours.  You feel nauseous or vomit.  You have a stiff neck or numbness in  your legs.  You are unable to urinate or have a bowel movement.  You develop a rash, itching, or sneezing. Get help right away if:  You have new symptoms or your symptoms get worse.  You have a seizure.  You have trouble breathing. Summary  A myelogram is an imaging study of the spinal cord and the places where nerves attach to the spinal cord (nerve roots).  Before the procedure, follow instructions from your health care provider about changing or stopping your regular medicines, and eating and drinking restrictions.  After this procedure, you will be asked to lie down with your head raised (elevated). This reduces your risk of a headache.  Do not bend, lift, or do hard work for 24-48 hours, or as told by your health care provider.  Contact a health care provider if you have a stiff neck or numbness in your legs. Get help right away if symptoms get worse, or you have a seizure or trouble breathing. This information is not intended to replace advice given to you by your health care provider. Make sure you discuss any questions you have with your health care provider. Document Revised: 01/10/2019 Document Reviewed: 01/11/2019 Elsevier Patient Education  2020 Elsevier Inc.  

## 2020-05-02 ENCOUNTER — Other Ambulatory Visit (INDEPENDENT_AMBULATORY_CARE_PROVIDER_SITE_OTHER): Payer: Self-pay | Admitting: Internal Medicine

## 2020-11-28 ENCOUNTER — Telehealth (INDEPENDENT_AMBULATORY_CARE_PROVIDER_SITE_OTHER): Payer: Self-pay | Admitting: *Deleted

## 2020-11-28 NOTE — Telephone Encounter (Signed)
This prescription has been sent to [facility].

## 2020-11-28 NOTE — Telephone Encounter (Signed)
Wife left message he needs refill on Prevacid 30 mg QD - they called pharmacy 3 day ago and still haven;t heard anything -  he is completely out - if questions she can be reached at [phone]

## 2020-12-18 ENCOUNTER — Other Ambulatory Visit: Payer: Self-pay

## 2020-12-18 ENCOUNTER — Ambulatory Visit (HOSPITAL_COMMUNITY)
Admission: RE | Admit: 2020-12-18 | Discharge: 2020-12-18 | Disposition: A | Discharge status: Home / Self Care | Payer: Commercial Managed Care - PPO | Source: Ambulatory Visit | Attending: Physician Assistant | Admitting: Physician Assistant

## 2020-12-18 ENCOUNTER — Other Ambulatory Visit (HOSPITAL_COMMUNITY): Payer: Self-pay | Admitting: Physician Assistant

## 2020-12-18 DIAGNOSIS — R079 Chest pain, unspecified: Secondary | ICD-10-CM | POA: Insufficient documentation

## 2021-06-10 ENCOUNTER — Other Ambulatory Visit (HOSPITAL_COMMUNITY): Payer: Self-pay | Admitting: Physician Assistant

## 2021-06-10 DIAGNOSIS — M5030 Other cervical disc degeneration, unspecified cervical region: Secondary | ICD-10-CM

## 2021-06-11 ENCOUNTER — Other Ambulatory Visit: Payer: Self-pay | Admitting: Physician Assistant

## 2021-06-11 ENCOUNTER — Other Ambulatory Visit (HOSPITAL_COMMUNITY): Payer: Self-pay | Admitting: Physician Assistant

## 2021-06-11 DIAGNOSIS — Z6832 Body mass index (BMI) 32.0-32.9, adult: Secondary | ICD-10-CM

## 2021-06-11 DIAGNOSIS — E6609 Other obesity due to excess calories: Secondary | ICD-10-CM

## 2021-06-11 DIAGNOSIS — M5030 Other cervical disc degeneration, unspecified cervical region: Secondary | ICD-10-CM

## 2021-06-19 ENCOUNTER — Other Ambulatory Visit (INDEPENDENT_AMBULATORY_CARE_PROVIDER_SITE_OTHER): Payer: Self-pay | Admitting: Internal Medicine

## 2021-06-22 ENCOUNTER — Other Ambulatory Visit (INDEPENDENT_AMBULATORY_CARE_PROVIDER_SITE_OTHER): Payer: Self-pay | Admitting: *Deleted

## 2021-06-22 MED ORDER — LANSOPRAZOLE 30 MG PO CPDR: 30.0000 mg | DELAYED_RELEASE_CAPSULE | Freq: Every day | ORAL | 0 refills | Status: DC

## 2021-06-25 ENCOUNTER — Ambulatory Visit (HOSPITAL_COMMUNITY): Payer: Commercial Managed Care - PPO

## 2021-06-25 ENCOUNTER — Encounter (HOSPITAL_COMMUNITY): Payer: Self-pay

## 2021-06-26 ENCOUNTER — Other Ambulatory Visit: Payer: Self-pay

## 2021-06-26 ENCOUNTER — Ambulatory Visit (HOSPITAL_COMMUNITY)
Admission: RE | Admit: 2021-06-26 | Discharge: 2021-06-26 | Disposition: A | Discharge status: Home / Self Care | Payer: Commercial Managed Care - PPO | Source: Ambulatory Visit | Attending: Physician Assistant | Admitting: Physician Assistant

## 2021-06-26 DIAGNOSIS — Z6832 Body mass index (BMI) 32.0-32.9, adult: Secondary | ICD-10-CM | POA: Insufficient documentation

## 2021-06-26 DIAGNOSIS — E6609 Other obesity due to excess calories: Secondary | ICD-10-CM | POA: Insufficient documentation

## 2021-06-26 DIAGNOSIS — M5030 Other cervical disc degeneration, unspecified cervical region: Secondary | ICD-10-CM | POA: Insufficient documentation

## 2021-07-20 ENCOUNTER — Other Ambulatory Visit (INDEPENDENT_AMBULATORY_CARE_PROVIDER_SITE_OTHER): Payer: Self-pay | Admitting: Internal Medicine

## 2021-08-17 ENCOUNTER — Other Ambulatory Visit (INDEPENDENT_AMBULATORY_CARE_PROVIDER_SITE_OTHER): Payer: Self-pay | Admitting: Gastroenterology

## 2021-09-02 ENCOUNTER — Ambulatory Visit (INDEPENDENT_AMBULATORY_CARE_PROVIDER_SITE_OTHER): Payer: Medicare HMO | Admitting: Gastroenterology

## 2021-09-02 ENCOUNTER — Other Ambulatory Visit: Payer: Self-pay

## 2021-09-02 ENCOUNTER — Encounter (INDEPENDENT_AMBULATORY_CARE_PROVIDER_SITE_OTHER): Payer: Self-pay | Admitting: Gastroenterology

## 2021-09-02 DIAGNOSIS — K219 Gastro-esophageal reflux disease without esophagitis: Secondary | ICD-10-CM | POA: Diagnosis not present

## 2021-09-02 MED ORDER — LANSOPRAZOLE 15 MG PO CPDR: 15.0000 mg | DELAYED_RELEASE_CAPSULE | Freq: Every day | ORAL | 0 refills | Status: DC

## 2021-09-02 NOTE — Progress Notes (Signed)
Maylon Peppers, M.D. Gastroenterology & Hepatology [facility] [address] [place], [place] [address]  Primary Care Physician: [name], MD [address] [place] [address]  I will communicate my assessment and recommendations to the referring MD via EMR.  Problems: GERD  History of Present Illness: Ryan Fuller is a 55 y.o. male with past medical history of GERD, kidney stones, multiple cervical surgeries, who presents for follow up of GERD.  The patient was last seen on 10/01/2019. At that time, the patient was continued on lansoprazole 30 mg every day.  States he has been having GERD since he was 55 years old.  He has been on antacids in the past for management of reflux, but has been on PPIs chronically for multiple years.  He has been on lansoprazole 30 mg for multiple years with adequate control of his GERD.  Has never decreased the dose in the past.  He states that as long as he takes the medication every day he does not present any episodes of heartburn or regurgitation, denies any dysphagia or odynophagia. The patient denies having any nausea, vomiting, fever, chills, hematochezia, melena, hematemesis, abdominal distention, abdominal pain, diarrhea, jaundice, pruritus or weight loss.  Barium esophagram 2020 -   prominent cricopharyngeus but barium pill but barium pill passed from oral cavity stomach without any delay  Last EGD:  October 2018 when he was noted to have small sliding hiatal hernia otherwise normal exam and his esophagus was dilated by passing 54 and 56 [place] Maloney dilator.  Last Colonoscopy: December 2019 - he has history of colonic polyps.  He had 2 small tubular adenomas removed.  He had 2 also the transverse colon and biopsy revealed nonspecific inflammation most likely due to NSAIDs which were discontinued.  He also had scattered diverticula at splenic flexure and sigmoid colon. Recommended to  repeat in 2024.  Past Medical History: Past Medical History:  Diagnosis Date   Dysphagia 08/10/2017   Gout    Gout 08/10/2017   Kidney stones     Past Surgical History: Past Surgical History:  Procedure Laterality Date   BACK SURGERY     BIOPSY  11/13/2018   Procedure: BIOPSY;  Surgeon: Rogene Houston, MD;  [facility];  Service: Endoscopy;;  terminal ileum colon   COLONOSCOPY N/A 03/01/2013   Procedure: COLONOSCOPY;  Surgeon: Rogene Houston, MD;  [facility];  Service: Endoscopy;  Laterality: N/A;  100   COLONOSCOPY N/A 11/13/2018   Procedure: COLONOSCOPY;  Surgeon: Rogene Houston, MD;  [facility]: [facility];  Service: Endoscopy;  Laterality: N/A;  1015am   EGD/ED     years ago   ESOPHAGEAL DILATION N/A 08/31/2017   Procedure: ESOPHAGEAL DILATION;  Surgeon: Rogene Houston, MD;  [facility];  Service: Endoscopy;  Laterality: N/A;   ESOPHAGOGASTRODUODENOSCOPY N/A 08/31/2017   Procedure: ESOPHAGOGASTRODUODENOSCOPY (EGD);  Surgeon: Rogene Houston, MD;  [facility]: [facility];  Service: Endoscopy;  Laterality: N/A;  3:10   POLYPECTOMY  11/13/2018   Procedure: POLYPECTOMY;  Surgeon: Rogene Houston, MD;  [facility]: [facility];  Service: Endoscopy;;  colon   Rotator cuff surgery      Family History: Family History  Problem Relation Age of Onset   Colon cancer Maternal Aunt     Social History: Social History   Tobacco Use  Smoking Status Never  Smokeless Tobacco Never   Social History   Substance  and Sexual Activity  Alcohol Use No   Social History   Substance and Sexual Activity  Drug Use No    Allergies: Allergies  Allergen Reactions   Phenergan [Promethazine Hcl] Anaphylaxis and Other (See Comments)    ?severe hypotension   Colchicine Other (See Comments)    Depression     Penicillins Other (See Comments)    From childhood Has patient had a PCN reaction causing immediate rash, facial/tongue/throat  swelling, SOB or lightheadedness with hypotension: Unknown Has patient had a PCN reaction causing severe rash involving mucus membranes or skin necrosis: Unknown Has patient had a PCN reaction that required hospitalization Unknown Has patient had a PCN reaction occurring within the last 10 years: Unknown If all of the above answers are "NO", then may proceed with Cephalosporin [place]   Vancomycin Swelling and Other (See Comments)    Lips swelling    Medications: Current Outpatient Medications  Medication Sig Dispense Refill   allopurinol (ZYLOPRIM) 100 MG tablet Take 100 mg by mouth 2 (two) times daily.      ibuprofen (ADVIL) 200 MG tablet Take 400-600 mg by mouth every 8 (eight) hours as needed (pain.).     lansoprazole (PREVACID) 30 MG capsule TAKE ONE CAPSULE BY MOUTH ONCE DAILY. 30 capsule 0   polyethylene glycol (MIRALAX / GLYCOLAX) packet Take 34 g by mouth daily. w/Coffee     Psyllium (METAMUCIL FREE & NATURAL PO) Take 2 scoop by mouth at bedtime.     rOPINIRole (REQUIP) 1 MG tablet Take 1 tablet by mouth at bedtime.     traMADol (ULTRAM) 50 MG tablet Take 100 mg by mouth in the morning, at noon, and at bedtime.      No current facility-administered medications for this visit.    Review of Systems: GENERAL: negative for malaise, night sweats HEENT: No changes in hearing or vision, no nose bleeds or other nasal problems. NECK: Negative for lumps, goiter, pain and significant neck swelling RESPIRATORY: Negative for cough, wheezing CARDIOVASCULAR: Negative for chest pain, leg swelling, palpitations, orthopnea GI: SEE HPI MUSCULOSKELETAL: Negative for joint pain or swelling, back pain, and muscle pain. SKIN: Negative for lesions, rash PSYCH: Negative for sleep disturbance, mood disorder and recent psychosocial stressors. HEMATOLOGY Negative for prolonged bleeding, bruising easily, and swollen nodes. ENDOCRINE: Negative for cold or heat intolerance, polyuria, polydipsia and  goiter. NEURO: negative for tremor, gait imbalance, syncope and seizures. The remainder of the review of systems is noncontributory.   Physical Exam: BP 133/81 (BP Location: Left Arm, Patient Position: Sitting, Cuff Size: Large)   Pulse 73   Temp 98 F (36.7 C) (Oral)   Ht 6' (1.829 m)   Wt 237 lb 1.6 oz (107.5 kg)   BMI 32.16 kg/m  GENERAL: The patient is AO x3, in no acute distress. HEENT: Head is normocephalic and atraumatic. EOMI are intact. Mouth is well hydrated and without lesions. NECK: Supple. No masses LUNGS: Clear to auscultation. No presence of rhonchi/wheezing/rales. Adequate chest expansion HEART: RRR, normal s1 and s2. ABDOMEN: Soft, nontender, no guarding, no peritoneal signs, and nondistended. BS +. No masses. EXTREMITIES: Without any cyanosis, clubbing, rash, lesions or edema. NEUROLOGIC: AOx3, no focal motor deficit. SKIN: no jaundice, no rashes  Imaging/Labs: as above  I personally reviewed and interpreted the available labs, imaging and endoscopic files.  Impression and Plan: Ryan Fuller is a 55 y.o. male with past medical history of GERD, kidney stones, multiple cervical surgeries, who presents for follow up of GERD.  The patient has presented adequate control of his symptoms while on the current dose of lansoprazole.  His GERD has been adequately controlled with PPI.  I had a discussion with the patient regarding the possibility of decreasing his dose to 15 mg every day, he is agreeable to try this.  If he has recurrent symptoms, we will need to increase back to the 30 mg dose chronically.  -Decrease lansoprazole to 15 mg every day - If heartburn recurs, patient should call to increase back to 30 mg every day - Patient she will take medication in the morning 30-45 minutes before eating breakfast.  Also should avoid eating within 2 hours of lying down to sleep and benefit of blocks to elevate head of bed. Also, will benefit from avoiding carbonated  drinks/sodas or food that has tomatoes, spicy or greasy food. - RTC 1 year  All questions were answered.      Harvel Quale, MD Gastroenterology and Hepatology [facility]

## 2021-09-02 NOTE — Patient Instructions (Signed)
Decrease lansoprazole to 15 mg every day If heartburn recurs, patient should call to increase back to 30 mg every day Patient she will take medication in the morning 30-45 minutes before eating breakfast.  Also should avoid eating within 2 hours of lying down to sleep and benefit of blocks to elevate head of bed. Also, will benefit from avoiding carbonated drinks/sodas or food that has tomatoes, spicy or greasy food.

## 2021-09-16 ENCOUNTER — Other Ambulatory Visit (INDEPENDENT_AMBULATORY_CARE_PROVIDER_SITE_OTHER): Payer: Self-pay | Admitting: Gastroenterology

## 2021-09-16 ENCOUNTER — Telehealth (INDEPENDENT_AMBULATORY_CARE_PROVIDER_SITE_OTHER): Payer: Self-pay

## 2021-09-16 NOTE — Telephone Encounter (Signed)
Rx was confirmed int Epic that it was sent to the pharmacy the day of his appointment

## 2021-09-16 NOTE — Telephone Encounter (Signed)
Did he request this refill? Can you please check on him if he is doing well on 15 mg? If doing well, I will refill for 15 instead of 30 mg

## 2021-09-16 NOTE — Telephone Encounter (Signed)
Requested Prescriptions Rx was sent the day off his last appt confirmed in Epic.  Name from pharmacy: LANSOPRAZOLE DR 30 MG CAPSULE       Will file in chart as: lansoprazole (PREVACID) 30 MG capsule    Possible duplicate: Hover to review recent actions on this medication   Sig: TAKE ONE CAPSULE BY Taylor.   Disp:  30 capsule    Refills:  5 (Pharmacy requested: 0)   Start: 09/16/2021   Class: Normal   Last refill: 08/17/2021      To be filled at: [place], [place]       Medication Refill (Newest Message First) You Just now (1:40 PM)   Per patient he has not started on the 15 mg as of yet, he states he just finished them and will call the pharmacy to make sure they have the rx for the 15 mg's.      Note    Harvel Quale, MD  You 29 minutes ago (1:11 PM)   Did he request this refill? Can you please check on him if he is doing well on 15 mg? If doing well, I will refill for 15 instead of 30 mg      Note    You  Montez Morita, Daniel, MD 1 hour ago (12:37 PM)   Recently seen and decreased med to 15 mg. Please advise.      Note    Interface, Surescripts Out routed conversation to [employer] [facility]

## 2021-09-16 NOTE — Telephone Encounter (Signed)
Per patient he has not started on the 15 mg as of yet, he states he just finished them and will call the pharmacy to make sure they have the rx for the 15 mg's.

## 2021-09-16 NOTE — Telephone Encounter (Signed)
Recently seen and decreased med to 15 mg. Please advise.

## 2021-10-02 DIAGNOSIS — M4722 Other spondylosis with radiculopathy, cervical region: Secondary | ICD-10-CM | POA: Diagnosis not present

## 2021-10-02 DIAGNOSIS — Z6832 Body mass index (BMI) 32.0-32.9, adult: Secondary | ICD-10-CM | POA: Diagnosis not present

## 2021-10-02 DIAGNOSIS — R030 Elevated blood-pressure reading, without diagnosis of hypertension: Secondary | ICD-10-CM | POA: Diagnosis not present

## 2021-10-06 ENCOUNTER — Other Ambulatory Visit (HOSPITAL_COMMUNITY): Payer: Self-pay | Admitting: Neurological Surgery

## 2021-10-06 ENCOUNTER — Other Ambulatory Visit: Payer: Self-pay | Admitting: Neurological Surgery

## 2021-10-06 DIAGNOSIS — M4722 Other spondylosis with radiculopathy, cervical region: Secondary | ICD-10-CM

## 2021-10-16 ENCOUNTER — Other Ambulatory Visit: Payer: Self-pay

## 2021-10-16 ENCOUNTER — Ambulatory Visit (HOSPITAL_COMMUNITY)
Admission: RE | Admit: 2021-10-16 | Discharge: 2021-10-16 | Disposition: A | Discharge status: Home / Self Care | Payer: Medicare HMO | Source: Ambulatory Visit | Attending: Neurological Surgery | Admitting: Neurological Surgery

## 2021-10-16 DIAGNOSIS — M542 Cervicalgia: Secondary | ICD-10-CM | POA: Diagnosis not present

## 2021-10-16 DIAGNOSIS — M4722 Other spondylosis with radiculopathy, cervical region: Secondary | ICD-10-CM | POA: Diagnosis not present

## 2021-10-16 DIAGNOSIS — M2578 Osteophyte, vertebrae: Secondary | ICD-10-CM | POA: Diagnosis not present

## 2021-10-16 DIAGNOSIS — M47812 Spondylosis without myelopathy or radiculopathy, cervical region: Secondary | ICD-10-CM | POA: Diagnosis not present

## 2021-10-19 ENCOUNTER — Other Ambulatory Visit (INDEPENDENT_AMBULATORY_CARE_PROVIDER_SITE_OTHER): Payer: Self-pay | Admitting: Gastroenterology

## 2021-10-19 DIAGNOSIS — K219 Gastro-esophageal reflux disease without esophagitis: Secondary | ICD-10-CM

## 2021-10-20 DIAGNOSIS — M353 Polymyalgia rheumatica: Secondary | ICD-10-CM | POA: Diagnosis not present

## 2021-10-20 DIAGNOSIS — H5200 Hypermetropia, unspecified eye: Secondary | ICD-10-CM | POA: Diagnosis not present

## 2021-10-21 DIAGNOSIS — M353 Polymyalgia rheumatica: Secondary | ICD-10-CM | POA: Diagnosis not present

## 2021-10-22 DIAGNOSIS — Z125 Encounter for screening for malignant neoplasm of prostate: Secondary | ICD-10-CM | POA: Diagnosis not present

## 2021-10-22 DIAGNOSIS — R39198 Other difficulties with micturition: Secondary | ICD-10-CM | POA: Diagnosis not present

## 2021-10-22 DIAGNOSIS — E6609 Other obesity due to excess calories: Secondary | ICD-10-CM | POA: Diagnosis not present

## 2021-10-22 DIAGNOSIS — L989 Disorder of the skin and subcutaneous tissue, unspecified: Secondary | ICD-10-CM | POA: Diagnosis not present

## 2021-10-22 DIAGNOSIS — Z6832 Body mass index (BMI) 32.0-32.9, adult: Secondary | ICD-10-CM | POA: Diagnosis not present

## 2021-10-28 DIAGNOSIS — M4722 Other spondylosis with radiculopathy, cervical region: Secondary | ICD-10-CM | POA: Diagnosis not present

## 2021-10-28 DIAGNOSIS — M353 Polymyalgia rheumatica: Secondary | ICD-10-CM | POA: Diagnosis not present

## 2021-11-25 ENCOUNTER — Telehealth (INDEPENDENT_AMBULATORY_CARE_PROVIDER_SITE_OTHER): Payer: Self-pay

## 2021-11-25 ENCOUNTER — Other Ambulatory Visit (INDEPENDENT_AMBULATORY_CARE_PROVIDER_SITE_OTHER): Payer: Self-pay | Admitting: Gastroenterology

## 2021-11-25 DIAGNOSIS — K219 Gastro-esophageal reflux disease without esophagitis: Secondary | ICD-10-CM

## 2021-11-25 MED ORDER — LANSOPRAZOLE 30 MG PO CPDR: 30.0000 mg | DELAYED_RELEASE_CAPSULE | Freq: Every day | ORAL | 3 refills | Status: DC

## 2021-11-25 NOTE — Telephone Encounter (Signed)
Sure, will send the 30 mg dose

## 2021-11-25 NOTE — Telephone Encounter (Signed)
Patient aware of all.

## 2021-11-25 NOTE — Telephone Encounter (Signed)
Patient called today stating since office visit in Oct when Lansoprazole was decreased to 15 mg from 30 mg he has had increased heartburn and indigestion. Would like to know if he can go back on the 30 mg . Please advise If so would like sent to [facility] .     LAST NOTE:  -Decrease lansoprazole to 15 mg every day - If heartburn recurs, patient should call to increase back to 30 mg every day - Patient she will take medication in the morning 30-45 minutes before eating breakfast.  Also should avoid eating within 2 hours of lying down to sleep and benefit of blocks to elevate head of bed. Also, will benefit from avoiding carbonated drinks/sodas or food that has tomatoes, spicy or greasy food. - RTC 1 year  ent to [place] .

## 2021-12-18 DIAGNOSIS — Z6831 Body mass index (BMI) 31.0-31.9, adult: Secondary | ICD-10-CM | POA: Diagnosis not present

## 2021-12-18 DIAGNOSIS — K219 Gastro-esophageal reflux disease without esophagitis: Secondary | ICD-10-CM | POA: Diagnosis not present

## 2021-12-18 DIAGNOSIS — J9801 Acute bronchospasm: Secondary | ICD-10-CM | POA: Diagnosis not present

## 2021-12-18 DIAGNOSIS — Z1331 Encounter for screening for depression: Secondary | ICD-10-CM | POA: Diagnosis not present

## 2021-12-18 DIAGNOSIS — E669 Obesity, unspecified: Secondary | ICD-10-CM | POA: Diagnosis not present

## 2021-12-18 DIAGNOSIS — J329 Chronic sinusitis, unspecified: Secondary | ICD-10-CM | POA: Diagnosis not present

## 2022-01-04 NOTE — Progress Notes (Signed)
Office Visit Note  Patient: Ryan Fuller             Date of Birth: 05-30-1966           MRN: [id number]             PCP: [name], MD Referring: Ginger Organ Visit Date: 01/14/2022 Occupation: @GUAROCC @  Subjective:  Pain in multiple joints  History of Present Illness: Ryan Fuller is a 56 y.o. male seen in consultation per request of Dr. Ellene Route.  According the patient he has had neck and lower back issues for many years.  He had lumbar spine surgery in 2018 by Dr. Hal Neer.  He states after discectomy, he developed an infection and it took him a while to recover.  He still has left-sided radiculopathy and left foot numbness.  He had C-spine fusion x2 once by Dr. Carloyn Manner and then second time by Dr. Ellene Route.  He continues to have neck pain and discomfort.  He went to see Dr. Ellene Route about 6 months ago due to increased pain in his neck and pain in his shoulders and arms.  He states he has numbness in his hands and has decreased grip strength.  He drops objects easily.  He had MRI of his cervical spine by Dr. Ellene Route.  Dr. Ellene Route also advised that surgery will be  extensive and is not advised.  He also felt that his symptoms may not be related to his cervical spine.  He denies any history of joint swelling.  He states he is in constant discomfort in his neck, shoulders and arms.  He also has some discomfort in other joints.  He was diagnosed with gout many years ago.  He has been on allopurinol for at least 4 years.  He states he could not tolerate colchicine as it caused depression.  He states that last gout flare was about a year ago.  Activities of Daily Living:  Patient reports morning stiffness for 30-60 minutes.   Patient Reports nocturnal pain.  Difficulty dressing/grooming: Denies Difficulty climbing stairs: Denies Difficulty getting out of chair: Denies Difficulty using hands for taps, buttons, cutlery, and/or writing: Denies  Review of Systems  Constitutional:  Negative  for fatigue.  HENT:  Negative for mouth sores, mouth dryness and nose dryness.   Eyes:  Negative for pain, itching and dryness.  Respiratory:  Negative for shortness of breath and difficulty breathing.   Cardiovascular:  Negative for chest pain and palpitations.  Gastrointestinal:  Negative for blood in stool, constipation and diarrhea.  Endocrine: Negative for increased urination.  Genitourinary:  Negative for difficulty urinating.  Musculoskeletal:  Positive for joint pain, joint pain, muscle weakness and morning stiffness. Negative for joint swelling, myalgias, muscle tenderness and myalgias.  Skin:  Negative for color change, rash, redness and sensitivity to sunlight.  Allergic/Immunologic: Negative for susceptible to infections.  Neurological:  Positive for numbness. Negative for dizziness, headaches and memory loss.  Hematological:  Negative for bruising/bleeding tendency and swollen glands.  Psychiatric/Behavioral:  Positive for sleep disturbance. Negative for depressed mood and confusion. The patient is not nervous/anxious.        Due to nocturnal pain   PMFS History:  Patient Active Problem List   Diagnosis Date Noted   GERD (gastroesophageal reflux disease) 09/02/2021   Sigmoid diverticulitis 10/01/2019   Family hx of colon cancer 10/10/2018   Rectal bleeding 10/10/2018   History of colonic polyps 10/10/2018   Gout 08/10/2017   Unspecified constipation  02/21/2013    Past Medical History:  Diagnosis Date   Dysphagia 08/10/2017   Gout    Gout 08/10/2017   Kidney stones     Family History  Problem Relation Age of Onset   Dementia Mother    Kidney cancer Father    Kidney failure Brother    Congestive Heart Failure Brother    Healthy Brother    Heart Problems Son    Healthy Son    Healthy Daughter    Past Surgical History:  Procedure Laterality Date   BACK SURGERY     BIOPSY  11/13/2018   Procedure: BIOPSY;  Surgeon: Rogene Houston, MD;  [facility]: [facility];   Service: Endoscopy;;  terminal ileum colon   COLONOSCOPY N/A 03/01/2013   Procedure: COLONOSCOPY;  Surgeon: Rogene Houston, MD;  [facility]: [facility];  Service: Endoscopy;  Laterality: N/A;  100   COLONOSCOPY N/A 11/13/2018   Procedure: COLONOSCOPY;  Surgeon: Rogene Houston, MD;  [facility]: [facility];  Service: Endoscopy;  Laterality: N/A;  1015am   EGD/ED     years ago   ESOPHAGEAL DILATION N/A 08/31/2017   Procedure: ESOPHAGEAL DILATION;  Surgeon: Rogene Houston, MD;  [facility]: [facility];  Service: Endoscopy;  Laterality: N/A;   ESOPHAGOGASTRODUODENOSCOPY N/A 08/31/2017   Procedure: ESOPHAGOGASTRODUODENOSCOPY (EGD);  Surgeon: Rogene Houston, MD;  [facility]: [facility];  Service: Endoscopy;  Laterality: N/A;  3:10   NECK SURGERY     x2   POLYPECTOMY  11/13/2018   Procedure: POLYPECTOMY;  Surgeon: Rogene Houston, MD;  [facility]: [facility];  Service: Endoscopy;;  colon   Rotator cuff surgery Right    Social History   Social History Narrative   Not on file    There is no immunization history on file for this patient.   Objective: Vital Signs: BP 128/87 (BP Location: Right Arm, Patient Position: Sitting, Cuff Size: Large)    Pulse 75    Ht 6' (1.829 m)    Wt 237 lb 3.2 oz (107.6 kg)    BMI 32.17 kg/m    Physical Exam Vitals and nursing note reviewed.  Constitutional:      Appearance: He is well-developed.  HENT:     Head: Normocephalic and atraumatic.  Eyes:     Conjunctiva/sclera: Conjunctivae normal.     Pupils: Pupils are equal, round, and reactive to light.  Cardiovascular:     Rate and Rhythm: Normal rate and regular rhythm.     Heart sounds: Normal heart sounds.  Pulmonary:     Effort: Pulmonary effort is normal.     Breath sounds: Normal breath sounds.  Abdominal:     General: Bowel sounds are normal.     Palpations: Abdomen is soft.  Musculoskeletal:     Cervical back: Normal range of motion and neck supple.  Skin:    General: Skin is  warm and dry.     Capillary Refill: Capillary refill takes less than 2 seconds.  Neurological:     Mental Status: He is alert and oriented to person, place, and time.  Psychiatric:        Behavior: Behavior normal.     Musculoskeletal Exam: He had limited cervical lateral rotation especially on the left side.  He had tenderness on palpation of the cervical spine.  Thoracic kyphosis noted.  He had limited painful range of motion of the lumbar spine.  Shoulder joint abduction was limited to 90 degrees and painful.  No effusion was noted.  Elbow joints and wrist joints with good range of motion.  Mild PIP and DIP thickening was noted.  There was no tenderness over MCPs PIPs or DIPs.  Hip joints and knee joints with good range of motion without any warmth swelling or effusion.  There was no tenderness over ankles or MTPs.  CDAI Exam: CDAI Score: -- Patient Global: --; Provider Global: -- Swollen: --; Tender: -- Joint Exam 01/14/2022   No joint exam has been documented for this visit   There is currently no information documented on the homunculus. Go to the Rheumatology activity and complete the homunculus joint exam.  Investigation: No additional findings.  Imaging: No results found.  Recent Labs: Lab Results  Component Value Date   WBC 5.8 05/27/2016   HGB 14.8 05/27/2016   PLT 187 05/27/2016   NA 138 05/27/2016   K 3.5 05/27/2016   CL 105 05/27/2016   CO2 26 05/27/2016   GLUCOSE 127 (H) 05/27/2016   BUN 9 05/27/2016   CREATININE 0.87 05/27/2016   BILITOT 0.4 05/27/2016   ALKPHOS 70 05/27/2016   AST 22 05/27/2016   ALT 29 05/27/2016   PROT 7.4 05/27/2016   ALBUMIN 4.4 05/27/2016   CALCIUM 8.9 05/27/2016   GFRAA >60 05/27/2016    Speciality Comments: No specialty comments available.  Procedures:  No procedures performed Allergies: Phenergan [promethazine hcl], Colchicine, Penicillins, and Vancomycin   Assessment / Plan:     Visit Diagnoses: Polyarthralgia -he  complains of pain and discomfort in multiple joints for the last 6 months.  The pain is mostly in his cervical spine and bilateral shoulders.  He complains of pain in bilateral hands and numbness in his hands.  He notices decreased grip strength.  I will obtain following labs today.  Plan: Sedimentation rate, Rheumatoid factor, Cyclic citrul peptide antibody, IgG, Uric acid.  I discussed possible use of Cymbalta for musculoskeletal pain.  Indications side effects of medications were discussed.  Patient declined Cymbalta at this time.  Chronic pain of both shoulders -he has been having pain and discomfort in his bilateral shoulders for many years.  The pain has been worse in the last 6 months.  Shoulder abduction was limited to 90 degrees bilaterally.  No effusion was noted.  Plan: XR Shoulder Left, XR Shoulder Right.  X-rays of bilateral shoulders showed acromioclavicular arthritis.  No glenohumeral joint space narrowing was noted.  No chondrocalcinosis was noted.  Pain in both hands -he complains of pain and discomfort in his bilateral hands.  He also complains of numbness in his hands and has decreased grip strength.  No synovitis was noted on the examination.  Plan: XR Hand 2 View Right, XR Hand 2 View Left.  X-rays of bilateral hands were consistent with osteoarthritis.  Chronic pain of right knee - h/o right knee injury several years ago.  He has intermittent discomfort.  No warmth swelling or effusion was noted.  DDD (degenerative disc disease), cervical - Status post fusion x2 by Dr. Carloyn Manner and Dr. Ellene Route.  Patient continues to have neck pain.  He states he had recent MRI by Dr. Ellene Route.  Dr. Ellene Route did not advise surgery.  He describes constant pain in his neck and radiating to his shoulders and upper back.  DDD (degenerative disc disease), lumbar - 2019 discectomy by Dr. Hal Neer.  He had infection after the discectomy.  He has residual left foot numbness and left-sided radiculopathy.  History of  gout-he has been on  allopurinol 200 mg p.o. daily.  He states he has not had any gout flare in last 1 year.  According to the patient he does not take colchicine as it caused depression in the past.  Other fatigue -has been experiencing fatigue due to insomnia.  He states that  insomnia is related to chronic pain..  Plan: CBC with Differential/Platelet, COMPLETE METABOLIC PANEL WITH GFR, CK  Other medical problems listed as follows:  RLS (restless legs syndrome)  History of gastroesophageal reflux (GERD)  Hx of colonic polyps  History of diverticulitis  Orders: Orders Placed This Encounter  Procedures   XR Shoulder Left   XR Shoulder Right   XR Hand 2 View Right   XR Hand 2 View Left   CBC with Differential/Platelet   COMPLETE METABOLIC PANEL WITH GFR   Sedimentation rate   CK   Rheumatoid factor   Cyclic citrul peptide antibody, IgG   Uric acid   No orders of the defined types were placed in this encounter.    Follow-Up Instructions: Return for Osteoarthritis, DDD.   Bo Merino, MD  Note - This record has been created using Editor, commissioning.  Chart creation errors have been sought, but may not always  have been located. Such creation errors do not reflect on  the standard of medical care.

## 2022-01-14 ENCOUNTER — Ambulatory Visit: Payer: Self-pay

## 2022-01-14 ENCOUNTER — Other Ambulatory Visit: Payer: Self-pay

## 2022-01-14 ENCOUNTER — Ambulatory Visit: Payer: Medicare HMO | Admitting: Rheumatology

## 2022-01-14 ENCOUNTER — Encounter: Payer: Self-pay | Admitting: Rheumatology

## 2022-01-14 VITALS — BP 128/87 | HR 75 | Ht 72.0 in | Wt 237.2 lb

## 2022-01-14 DIAGNOSIS — M25561 Pain in right knee: Secondary | ICD-10-CM

## 2022-01-14 DIAGNOSIS — M19011 Primary osteoarthritis, right shoulder: Secondary | ICD-10-CM | POA: Diagnosis not present

## 2022-01-14 DIAGNOSIS — G8929 Other chronic pain: Secondary | ICD-10-CM

## 2022-01-14 DIAGNOSIS — M19041 Primary osteoarthritis, right hand: Secondary | ICD-10-CM | POA: Diagnosis not present

## 2022-01-14 DIAGNOSIS — M5136 Other intervertebral disc degeneration, lumbar region: Secondary | ICD-10-CM

## 2022-01-14 DIAGNOSIS — M5030 Other cervical disc degeneration, unspecified cervical region: Secondary | ICD-10-CM | POA: Diagnosis not present

## 2022-01-14 DIAGNOSIS — M25512 Pain in left shoulder: Secondary | ICD-10-CM | POA: Diagnosis not present

## 2022-01-14 DIAGNOSIS — M79642 Pain in left hand: Secondary | ICD-10-CM | POA: Diagnosis not present

## 2022-01-14 DIAGNOSIS — Z8739 Personal history of other diseases of the musculoskeletal system and connective tissue: Secondary | ICD-10-CM

## 2022-01-14 DIAGNOSIS — M47812 Spondylosis without myelopathy or radiculopathy, cervical region: Secondary | ICD-10-CM

## 2022-01-14 DIAGNOSIS — M2550 Pain in unspecified joint: Secondary | ICD-10-CM | POA: Diagnosis not present

## 2022-01-14 DIAGNOSIS — Z86010 Personal history of colonic polyps: Secondary | ICD-10-CM

## 2022-01-14 DIAGNOSIS — R5383 Other fatigue: Secondary | ICD-10-CM

## 2022-01-14 DIAGNOSIS — M5116 Intervertebral disc disorders with radiculopathy, lumbar region: Secondary | ICD-10-CM

## 2022-01-14 DIAGNOSIS — M79641 Pain in right hand: Secondary | ICD-10-CM

## 2022-01-14 DIAGNOSIS — R200 Anesthesia of skin: Secondary | ICD-10-CM

## 2022-01-14 DIAGNOSIS — M25511 Pain in right shoulder: Secondary | ICD-10-CM | POA: Diagnosis not present

## 2022-01-14 DIAGNOSIS — G2581 Restless legs syndrome: Secondary | ICD-10-CM

## 2022-01-14 DIAGNOSIS — Z8719 Personal history of other diseases of the digestive system: Secondary | ICD-10-CM

## 2022-01-15 LAB — RHEUMATOID FACTOR: Rheumatoid fact SerPl-aCnc: <14 IU/mL (ref <14)

## 2022-01-15 LAB — COMPLETE METABOLIC PANEL WITH GFR
AG Ratio: 1.7 (calc) (ref 1.0–2.5)
ALT: 27 U/L (ref 9–46)
AST: 26 U/L (ref 10–35)
Albumin: 4.7 g/dL (ref 3.6–5.1)
Alkaline phosphatase (APISO): 82 U/L (ref 35–144)
BUN: 15 mg/dL (ref 7–25)
CO2: 27 mmol/L (ref 20–32)
Calcium: 10.4 mg/dL — ABNORMAL HIGH (ref 8.6–10.3)
Chloride: 101 mmol/L (ref 98–110)
Creat: 1.07 mg/dL (ref 0.70–1.30)
Globulin: 2.8 g/dL (calc) (ref 1.9–3.7)
Glucose, Bld: 98 mg/dL (ref 65–99)
Potassium: 4.5 mmol/L (ref 3.5–5.3)
Sodium: 137 mmol/L (ref 135–146)
Total Bilirubin: 0.7 mg/dL (ref 0.2–1.2)
Total Protein: 7.5 g/dL (ref 6.1–8.1)
eGFR: 81 mL/min/{1.73_m2} (ref >=60)

## 2022-01-15 LAB — CBC WITH DIFFERENTIAL/PLATELET
Absolute Monocytes: 480 cells/uL (ref 200–950)
Basophils Absolute: 29 cells/uL (ref 0–200)
Basophils Relative: 0.6 %
Eosinophils Absolute: 118 cells/uL (ref 15–500)
Eosinophils Relative: 2.4 %
HCT: 44.6 % (ref 38.5–50.0)
Hemoglobin: 15 g/dL (ref 13.2–17.1)
Lymphs Abs: 1891 cells/uL (ref 850–3900)
MCH: 29.3 pg (ref 27.0–33.0)
MCHC: 33.6 g/dL (ref 32.0–36.0)
MCV: 87.1 fL (ref 80.0–100.0)
MPV: 9.1 fL (ref 7.5–12.5)
Monocytes Relative: 9.8 %
Neutro Abs: 2381 cells/uL (ref 1500–7800)
Neutrophils Relative %: 48.6 %
Platelets: 251 10*3/uL (ref 140–400)
RBC: 5.12 10*6/uL (ref 4.20–5.80)
RDW: 14.2 % (ref 11.0–15.0)
Total Lymphocyte: 38.6 %
WBC: 4.9 10*3/uL (ref 3.8–10.8)

## 2022-01-15 LAB — CYCLIC CITRUL PEPTIDE ANTIBODY, IGG: Cyclic Citrullin Peptide Ab: <16 UNITS

## 2022-01-15 LAB — SEDIMENTATION RATE: Sed Rate: 9 mm/h (ref 0–20)

## 2022-01-15 LAB — CK: Total CK: 153 U/L (ref 44–196)

## 2022-01-15 LAB — URIC ACID: Uric Acid, Serum: 6 mg/dL (ref 4.0–8.0)

## 2022-01-15 NOTE — Progress Notes (Signed)
I will discuss results at the follow-up visit.

## 2022-01-24 NOTE — Progress Notes (Addendum)
Office Visit Note  Patient: Ryan Fuller             Date of Birth: September 03, 1966           MRN: [id number]             PCP: Assunta Found, MD Referring: Assunta Found, MD Visit Date: 02/04/2022 Occupation: @GUAROCC @  Subjective:  Pain in the neck and both shoulders  History of Present Illness: Draylon V Zuckerman is a 56 y.o. male with history of pain in neck and bilateral hands.  He states he has been having pain and discomfort in his bilateral shoulders and bilateral arms.  He also experiences numbness in his bilateral hands.  The pain in his neck radiates into his arms.  He states he developed symptoms of carpal tunnel syndrome prior to his C-spine surgery and then the symptoms resolved after the surgery.  The symptoms have recurred now.  He is concerned that he may have recurrence of disc disease.  He also has discomfort raising his arms due to shoulder joint pain.  He had 2 minor flares of gout last year.  Activities of Daily Living:  Patient reports morning stiffness for a few hours.   Patient Reports nocturnal pain.  Difficulty dressing/grooming: Reports Difficulty climbing stairs: Denies Difficulty getting out of chair: Denies Difficulty using hands for taps, buttons, cutlery, and/or writing: Denies  Review of Systems  Constitutional:  Positive for fatigue.  HENT:  Positive for mouth dryness. Negative for mouth sores and nose dryness.   Eyes:  Negative for pain, itching and dryness.  Respiratory:  Negative for shortness of breath and difficulty breathing.   Cardiovascular:  Negative for chest pain and palpitations.  Gastrointestinal:  Negative for blood in stool, constipation and diarrhea.  Endocrine: Negative for increased urination.  Genitourinary:  Negative for difficulty urinating.  Musculoskeletal:  Positive for joint pain, joint pain, myalgias, morning stiffness and myalgias. Negative for joint swelling and muscle tenderness.  Skin:  Negative for color change, rash and  redness.  Allergic/Immunologic: Negative for susceptible to infections.  Neurological:  Positive for numbness, parasthesias and weakness. Negative for dizziness, headaches and memory loss.  Hematological:  Negative for bruising/bleeding tendency.  Psychiatric/Behavioral:  Negative for confusion.    PMFS History:  Patient Active Problem List   Diagnosis Date Noted   DDD (degenerative disc disease), cervical 02/04/2022   DDD (degenerative disc disease), lumbar 02/04/2022   Primary osteoarthritis of both hands 02/04/2022   Arthritis of both acromioclavicular joints 02/04/2022   GERD (gastroesophageal reflux disease) 09/02/2021   Sigmoid diverticulitis 10/01/2019   Family hx of colon cancer 10/10/2018   Rectal bleeding 10/10/2018   History of colonic polyps 10/10/2018   Gout 08/10/2017   Unspecified constipation 02/21/2013    Past Medical History:  Diagnosis Date   Dysphagia 08/10/2017   Gout    Gout 08/10/2017   Iritis    dx by Dr. Charise Killian   Kidney stones     Family History  Problem Relation Age of Onset   Dementia Mother    Kidney cancer Father    Kidney failure Brother    Congestive Heart Failure Brother    Healthy Brother    Heart Problems Son    Healthy Son    Healthy Daughter    Past Surgical History:  Procedure Laterality Date   BACK SURGERY     BIOPSY  11/13/2018   Procedure: BIOPSY;  Surgeon: Malissa Hippo, MD;  Location: [facility];  Service: Endoscopy;;  terminal ileum colon   COLONOSCOPY N/A 03/01/2013   Procedure: COLONOSCOPY;  Surgeon: Malissa Hippo, MD;  [facility]: [facility];  Service: Endoscopy;  Laterality: N/A;  100   COLONOSCOPY N/A 11/13/2018   Procedure: COLONOSCOPY;  Surgeon: Malissa Hippo, MD;  [facility]: [facility];  Service: Endoscopy;  Laterality: N/A;  1015am   EGD/ED     years ago   ESOPHAGEAL DILATION N/A 08/31/2017   Procedure: ESOPHAGEAL DILATION;  Surgeon: Malissa Hippo, MD;  [facility];  Service: Endoscopy;  Laterality: N/A;   ESOPHAGOGASTRODUODENOSCOPY N/A 08/31/2017   Procedure: ESOPHAGOGASTRODUODENOSCOPY (EGD);  Surgeon: Malissa Hippo, MD;  [facility];  Service: Endoscopy;  Laterality: N/A;  3:10   NECK SURGERY     x2   POLYPECTOMY  11/13/2018   Procedure: POLYPECTOMY;  Surgeon: Malissa Hippo, MD;  [facility]: [facility];  Service: Endoscopy;;  colon   Rotator cuff surgery Right    Social History   Social History Narrative   Not on file    There is no immunization history on file for this patient.   Objective: Vital Signs: BP (!) 143/88 (BP Location: Left Arm, Patient Position: Sitting, Cuff Size: Normal)   Pulse 87   Ht 6' (1.829 m)   Wt 237 lb (107.5 kg)   BMI 32.14 kg/m    Physical Exam Vitals and nursing note reviewed.  Constitutional:      Appearance: He is well-developed.  HENT:     Head: Normocephalic and atraumatic.  Eyes:     Conjunctiva/sclera: Conjunctivae normal.     Pupils: Pupils are equal, round, and reactive to light.  Cardiovascular:     Rate and Rhythm: Normal rate and regular rhythm.     Heart sounds: Normal heart sounds.  Pulmonary:     Effort: Pulmonary effort is normal.     Breath sounds: Normal breath sounds.  Abdominal:     General: Bowel sounds are normal.     Palpations: Abdomen is soft.  Musculoskeletal:     Cervical back: Normal range of motion and neck supple.  Skin:    General: Skin is warm and dry.     Capillary Refill: Capillary refill takes less than 2 seconds.  Neurological:     Mental Status: He is alert and oriented to person, place, and time.  Psychiatric:        Behavior: Behavior normal.     Musculoskeletal Exam: He had limited lateral rotation of cervical spine with discomfort.  He had bilateral shoulder joint 100 degree abduction and forward flexion.  He had painful internal rotation.  Elbow joints with good range of motion.  He had bilateral DIP and PIP thickening  with no synovitis.  Hip joints and knee joints with good range of motion without any warmth swelling or effusion.  There was no tenderness over ankles or MTPs.  CDAI Exam: CDAI Score: -- Patient Global: --; Provider Global: -- Swollen: --; Tender: -- Joint Exam 02/04/2022   No joint exam has been documented for this visit   There is currently no information documented on the homunculus. Go to the Rheumatology activity and complete the homunculus joint exam.  Investigation: No additional findings.  Imaging: XR Hand 2 View Left  Result Date: 01/14/2022 CMC, PIP and DIP narrowing was noted.  No MCP, intercarpal or radiocarpal joint space narrowing was noted.  No erosive changes were noted. Impression: These findings are consistent with osteoarthritis  of the hand.  XR Hand 2 View Right  Result Date: 01/14/2022 CMC, PIP and DIP narrowing was noted.  No MCP, intercarpal or radiocarpal joint space narrowing was noted.  No erosive changes were noted. Impression: These findings are consistent with osteoarthritis of the hand.  XR Shoulder Left  Result Date: 01/14/2022 No glenohumeral joint space narrowing was noted.  Acromioclavicular joint space narrowing was noted.  No chondrocalcinosis was noted. Impression: These findings are consistent with acromioclavicular arthritis.  XR Shoulder Right  Result Date: 01/14/2022 No glenohumeral joint space narrowing was noted.  Acromioclavicular joint space narrowing was noted.  No chondrocalcinosis was noted. Impression: These findings are consistent with acromioclavicular arthritis.   Recent Labs: Lab Results  Component Value Date   WBC 4.9 01/14/2022   HGB 15.0 01/14/2022   PLT 251 01/14/2022   NA 137 01/14/2022   K 4.5 01/14/2022   CL 101 01/14/2022   CO2 27 01/14/2022   GLUCOSE 98 01/14/2022   BUN 15 01/14/2022   CREATININE 1.07 01/14/2022   BILITOT 0.7 01/14/2022   ALKPHOS 70 05/27/2016   AST 26 01/14/2022   ALT 27 01/14/2022   PROT 7.5  01/14/2022   ALBUMIN 4.4 05/27/2016   CALCIUM 10.4 (H) 01/14/2022   GFRAA >60 05/27/2016   January 14, 2022 ESR 9, CK153, RF negative, anti-CCP negative, uric acid 6.0  Speciality Comments: No specialty comments available.  Procedures:  Large Joint Inj: bilateral subacromial bursa on 02/04/2022 2:01 PM Indications: pain Details: 27 G 1.5 in needle, posterior approach  Arthrogram: No  Medications (Right): 1.5 mL lidocaine 1 %; 40 mg triamcinolone acetonide 40 MG/ML Aspirate (Right): 0 mL Medications (Left): 1.5 mL lidocaine 1 %; 40 mg triamcinolone acetonide 40 MG/ML Aspirate (Left): 0 mL Outcome: tolerated well, no immediate complications Procedure, treatment alternatives, risks and benefits explained, specific risks discussed. Consent was given by the patient. Immediately prior to procedure a time out was called to verify the correct patient, procedure, equipment, support staff and site/side marked as required. Patient was prepped and draped in the usual sterile fashion.    Allergies: Phenergan [promethazine hcl], Colchicine, Penicillins, and Vancomycin   Assessment / Plan:     Visit Diagnoses: Arthritis of both acromioclavicular joints - History of pain in bilateral shoulders for many years.  Shoulder joint abduction was limited to 90 degrees.  X-ray showed acromioclavicular arthritis.x-ray findings were discussed with the patient and his wife.  As he is having a lot of discomfort we decided to do bilateral subacromial bursa injection.  I will see response to it.  I also offered physical therapy which she declined.  If he has an inadequate response to the injection I may do a glenohumeral injection at the follow-up visit.  I gave him a handout on shoulder joint exercises.  Primary osteoarthritis of both hands - Clinical examination and x-rays were consistent with osteoarthritis.  X-ray findings were discussed.  He complains of numbness and decreased grip strength in his bilateral hands.  I  will schedule nerve conduction velocities due to ongoing paresthesias in his bilateral hands.  Chronic pain of right knee - History of injury in the past.  He complains of intermittent discomfort in the knee joint.  DDD (degenerative disc disease), cervical - Status post fusion x2,by Dr. Channing Mutters and Dr. Danielle Dess.  History of chronic neck pain and radiculopathy to both shoulders. No surgery was advised by Dr. Danielle Dess.  He has ongoing pain and stiffness in his neck.  He has limited  lateral rotation.  DDD (degenerative disc disease), lumbar - Discectomy 2019 by Dr. Gerlene Fee.  He had infection after his discectomy.  He still has residual left-sided radiculopathy and numbness.  Chronic pain.  History of gout - He takes allopurinol 200 mg p.o. daily.  Uric acid is 6.0.  He would benefit by increasing allopurinol dose to 300 mg p.o. daily.  He will discuss that further with Dr. Phillips Odor at the follow-up visit.  Dietary modifications were discussed.  Colchicine causes depression per patient.  History of iritis - h/o iritis twice a year. He is followed by Dr.Cotter (optometrist) per patient.  Patient states he was diagnosed with iritis several years ago.  He has about 2 episodes of iritis..  I will refer him to ophthalmology to confirm the diagnosis.  Other medical problems are listed as follows:  Other fatigue  History of diverticulitis  Hx of colonic polyps  History of gastroesophageal reflux (GERD)  RLS (restless legs syndrome)  Orders: Orders Placed This Encounter  Procedures   Large Joint Inj   Ambulatory referral to Ophthalmology   Ambulatory referral to Neurology   No orders of the defined types were placed in this encounter.    Follow-Up Instructions: Return in about 3 months (around 05/07/2022) for Osteoarthritis.   Pollyann Savoy, MD  Note - This record has been created using Animal nutritionist.  Chart creation errors have been sought, but may not always  have been located. Such  creation errors do not reflect on  the standard of medical care.

## 2022-01-26 DIAGNOSIS — D239 Other benign neoplasm of skin, unspecified: Secondary | ICD-10-CM | POA: Diagnosis not present

## 2022-02-04 ENCOUNTER — Other Ambulatory Visit: Payer: Self-pay

## 2022-02-04 ENCOUNTER — Ambulatory Visit: Payer: Medicare HMO | Admitting: Rheumatology

## 2022-02-04 ENCOUNTER — Encounter: Payer: Self-pay | Admitting: Rheumatology

## 2022-02-04 VITALS — BP 143/88 | HR 87 | Ht 72.0 in | Wt 237.0 lb

## 2022-02-04 DIAGNOSIS — M51369 Other intervertebral disc degeneration, lumbar region without mention of lumbar back pain or lower extremity pain: Secondary | ICD-10-CM

## 2022-02-04 DIAGNOSIS — Z8739 Personal history of other diseases of the musculoskeletal system and connective tissue: Secondary | ICD-10-CM

## 2022-02-04 DIAGNOSIS — M25561 Pain in right knee: Secondary | ICD-10-CM | POA: Diagnosis not present

## 2022-02-04 DIAGNOSIS — Z8719 Personal history of other diseases of the digestive system: Secondary | ICD-10-CM

## 2022-02-04 DIAGNOSIS — M5030 Other cervical disc degeneration, unspecified cervical region: Secondary | ICD-10-CM | POA: Diagnosis not present

## 2022-02-04 DIAGNOSIS — M19012 Primary osteoarthritis, left shoulder: Secondary | ICD-10-CM | POA: Diagnosis not present

## 2022-02-04 DIAGNOSIS — Z860100 Personal history of colon polyps, unspecified: Secondary | ICD-10-CM

## 2022-02-04 DIAGNOSIS — G8929 Other chronic pain: Secondary | ICD-10-CM

## 2022-02-04 DIAGNOSIS — M19011 Primary osteoarthritis, right shoulder: Secondary | ICD-10-CM | POA: Diagnosis not present

## 2022-02-04 DIAGNOSIS — M19042 Primary osteoarthritis, left hand: Secondary | ICD-10-CM

## 2022-02-04 DIAGNOSIS — R5383 Other fatigue: Secondary | ICD-10-CM

## 2022-02-04 DIAGNOSIS — M5136 Other intervertebral disc degeneration, lumbar region: Secondary | ICD-10-CM

## 2022-02-04 DIAGNOSIS — Z8669 Personal history of other diseases of the nervous system and sense organs: Secondary | ICD-10-CM

## 2022-02-04 DIAGNOSIS — M19041 Primary osteoarthritis, right hand: Secondary | ICD-10-CM

## 2022-02-04 DIAGNOSIS — G2581 Restless legs syndrome: Secondary | ICD-10-CM

## 2022-02-04 DIAGNOSIS — R202 Paresthesia of skin: Secondary | ICD-10-CM | POA: Diagnosis not present

## 2022-02-04 MED ORDER — LIDOCAINE HCL 1 % IJ SOLN
1.5000 mL | INTRAMUSCULAR | Status: AC | PRN
  Administered 2022-02-04: 1.5 mL

## 2022-02-04 MED ORDER — TRIAMCINOLONE ACETONIDE 40 MG/ML IJ SUSP
40.0000 mg | INTRAMUSCULAR | Status: AC | PRN
  Administered 2022-02-04: 40 mg via INTRA_ARTICULAR

## 2022-02-04 NOTE — Patient Instructions (Addendum)
Osteoarthritis ?Osteoarthritis is a type of arthritis. It refers to joint pain or joint disease. Osteoarthritis affects tissue that covers the ends of bones in joints (cartilage). Cartilage acts as a cushion between the bones and helps them move smoothly. Osteoarthritis occurs when cartilage in the joints gets worn down. Osteoarthritis is sometimes called "wear and tear" arthritis. ?Osteoarthritis is the most common form of arthritis. It often occurs in older people. It is a condition that gets worse over time. The joints most often affected by this condition are in the fingers, toes, hips, knees, and spine, including the neck and lower back. ?What are the causes? ?This condition is caused by the wearing down of cartilage that covers the ends of bones. ?What increases the risk? ?The following factors may make you more likely to develop this condition: ?Being age 50 or older. ?Obesity. ?Overuse of joints. ?Past injury of a joint. ?Past surgery on a joint. ?Family history of osteoarthritis. ?What are the signs or symptoms? ?The main symptoms of this condition are pain, swelling, and stiffness in the joint. Other symptoms may include: ?An enlarged joint. ?More pain and further damage caused by small pieces of bone or cartilage that break off and float inside of the joint. ?Small deposits of bone (osteophytes) that grow on the edges of the joint. ?A grating or scraping feeling inside the joint when you move it. ?Popping or creaking sounds when you move. ?Difficulty walking or exercising. ?An inability to grip items, twist your hand(s), or control the movements of your hands and fingers. ?How is this diagnosed? ?This condition may be diagnosed based on: ?Your medical history. ?A physical exam. ?Your symptoms. ?X-rays of the affected joint(s). ?Blood tests to rule out other types of arthritis. ?How is this treated? ?There is no cure for this condition, but treatment can help control pain and improve joint function.  Treatment may include a combination of therapies, such as: ?Pain relief techniques, such as: ?Applying heat and cold to the joint. ?Massage. ?A form of talk therapy called cognitive behavioral therapy (CBT). This therapy helps you set goals and follow up on the changes that you make. ?Medicines for pain and inflammation. The medicines can be taken by mouth or applied to the skin. They include: ?NSAIDs, such as ibuprofen. ?Prescription medicines. ?Strong anti-inflammatory medicines (corticosteroids). ?Certain nutritional supplements. ?A prescribed exercise program. You may work with a physical therapist. ?Assistive devices, such as a brace, wrap, splint, specialized glove, or cane. ?A weight control plan. ?Surgery, such as: ?An osteotomy. This is done to reposition the bones and relieve pain or to remove loose pieces of bone and cartilage. ?Joint replacement surgery. You may need this surgery if you have advanced osteoarthritis. ?Follow these instructions at home: ?Activity ?Rest your affected joints as told by your health care provider. ?Exercise as told by your health care provider. He or she may recommend specific types of exercise, such as: ?Strengthening exercises. These are done to strengthen the muscles that support joints affected by arthritis. ?Aerobic activities. These are exercises, such as brisk walking or water aerobics, that increase your heart rate. ?Range-of-motion activities. These help your joints move more easily. ?Balance and agility exercises. ?Managing pain, stiffness, and swelling ?  ?If directed, apply heat to the affected area as often as told by your health care provider. Use the heat source that your health care provider recommends, such as a moist heat pack or a heating pad. ?If you have a removable assistive device, remove it as told by  your health care provider. ?Place a towel between your skin and the heat source. If your health care provider tells you to keep the assistive device on  while you apply heat, place a towel between the assistive device and the heat source. ?Leave the heat on for 20-30 minutes. ?Remove the heat if your skin turns bright red. This is especially important if you are unable to feel pain, heat, or cold. You may have a greater risk of getting burned. ?If directed, put ice on the affected area. To do this: ?If you have a removable assistive device, remove it as told by your health care provider. ?Put ice in a plastic bag. ?Place a towel between your skin and the bag. If your health care provider tells you to keep the assistive device on during icing, place a towel between the assistive device and the bag. ?Leave the ice on for 20 minutes, 2-3 times a day. ?Move your fingers or toes often to reduce stiffness and swelling. ?Raise (elevate) the injured area above the level of your heart while you are sitting or lying down. ?General instructions ?Take over-the-counter and prescription medicines only as told by your health care provider. ?Maintain a healthy weight. Follow instructions from your health care provider for weight control. ?Do not use any products that contain nicotine or tobacco, such as cigarettes, e-cigarettes, and chewing tobacco. If you need help quitting, ask your health care provider. ?Use assistive devices as told by your health care provider. ?Keep all follow-up visits as told by your health care provider. This is important. ?Where to find more information ?[employer] of Arthritis and Musculoskeletal and Skin Diseases: www.niams.SouthExposed.es ?[employer] on Aging: http://kim-miller.com/ ?[employer] of Rheumatology: www.rheumatology.org ?Contact a health care provider if: ?You have redness, swelling, or a feeling of warmth in a joint that gets worse. ?You have a fever along with joint or muscle aches. ?You develop a rash. ?You have trouble doing your normal activities. ?Get help right away if: ?You have pain that gets worse and is not relieved by  pain medicine. ?Summary ?Osteoarthritis is a type of arthritis that affects tissue covering the ends of bones in joints (cartilage). ?This condition is caused by the wearing down of cartilage that covers the ends of bones. ?The main symptom of this condition is pain, swelling, and stiffness in the joint. ?There is no cure for this condition, but treatment can help control pain and improve joint function. ?This information is not intended to replace advice given to you by your health care provider. Make sure you discuss any questions you have with your health care provider. ?Document Revised: 10/29/2019 Document Reviewed: 10/29/2019 ?Elsevier Patient Education ? [place]. ?Cervical Strain and Sprain Rehab ?Ask your health care provider which exercises are safe for you. Do exercises exactly as told by your health care provider and adjust them as directed. It is normal to feel mild stretching, pulling, tightness, or discomfort as you do these exercises. Stop right away if you feel sudden pain or your pain gets worse. Do not begin these exercises until told by your health care provider. ?Stretching and range-of-motion exercises ?Cervical side bending ? ?Using good posture, sit on a stable chair or stand up. ?Without moving your shoulders, slowly tilt your left / right ear to your shoulder until you feel a stretch in the opposite side neck muscles. You should be looking straight ahead. ?Hold for __________ seconds. ?Repeat with the other side of your neck. ?Repeat __________ times. Complete  this exercise __________ times a day. ?Cervical rotation ? ?Using good posture, sit on a stable chair or stand up. ?Slowly turn your head to the side as if you are looking over your left / right shoulder. ?Keep your eyes level with the ground. ?Stop when you feel a stretch along the side and the back of your neck. ?Hold for __________ seconds. ?Repeat this by turning to your other side. ?Repeat __________ times. Complete this  exercise __________ times a day. ?Thoracic extension and pectoral stretch ?Roll a towel or a small blanket so it is about 4 inches (10 cm) in diameter. ?Lie down on your back on a firm surface. ?Put the towel l

## 2022-02-05 ENCOUNTER — Telehealth: Payer: Self-pay | Admitting: Rheumatology

## 2022-02-05 NOTE — Telephone Encounter (Signed)
Patient called the office stating he was seen by Dr. Estanislado Pandy yesterday. Patient states at that appointment she told him she would refer him to have a nerve study done on his arms. Patient states ever since he had surgery on his lower back his feet have been numb and he would like to do the study on his feet too if possible. Please advise.  ?

## 2022-02-05 NOTE — Telephone Encounter (Signed)
Referral changed

## 2022-02-05 NOTE — Telephone Encounter (Signed)
Okay to schedule for nerve conduction velocities for both upper and lower extremities.

## 2022-02-10 DIAGNOSIS — Z0001 Encounter for general adult medical examination with abnormal findings: Secondary | ICD-10-CM | POA: Diagnosis not present

## 2022-02-10 DIAGNOSIS — J329 Chronic sinusitis, unspecified: Secondary | ICD-10-CM | POA: Diagnosis not present

## 2022-02-10 DIAGNOSIS — E782 Mixed hyperlipidemia: Secondary | ICD-10-CM | POA: Diagnosis not present

## 2022-02-10 DIAGNOSIS — E669 Obesity, unspecified: Secondary | ICD-10-CM | POA: Diagnosis not present

## 2022-02-10 DIAGNOSIS — K219 Gastro-esophageal reflux disease without esophagitis: Secondary | ICD-10-CM | POA: Diagnosis not present

## 2022-02-10 DIAGNOSIS — Z6830 Body mass index (BMI) 30.0-30.9, adult: Secondary | ICD-10-CM | POA: Diagnosis not present

## 2022-02-10 DIAGNOSIS — Z1331 Encounter for screening for depression: Secondary | ICD-10-CM | POA: Diagnosis not present

## 2022-02-10 DIAGNOSIS — Z125 Encounter for screening for malignant neoplasm of prostate: Secondary | ICD-10-CM | POA: Diagnosis not present

## 2022-03-17 ENCOUNTER — Encounter: Payer: Self-pay | Admitting: Physical Medicine and Rehabilitation

## 2022-03-17 ENCOUNTER — Ambulatory Visit: Payer: Medicare HMO | Admitting: Physical Medicine and Rehabilitation

## 2022-03-17 DIAGNOSIS — R202 Paresthesia of skin: Secondary | ICD-10-CM | POA: Diagnosis not present

## 2022-03-17 NOTE — Progress Notes (Signed)
Pt state both hand pain and numbness. Pt state he has numbness in his left pinky, ring and middle finger. Pt state he has been dropping items. Pt state he takes pain meds for the pain. ? ?Numeric Pain Rating Scale and Functional Assessment ?Average Pain 5 ? ? ?In the last MONTH (on 0-10 scale) has pain interfered with the following? ? ?1. General activity like being  able to carry out your everyday physical activities such as walking, climbing stairs, carrying groceries, or moving a chair?  ?Rating(8) ? ? ? -BT, -Dye Allergies. ? ?

## 2022-03-23 DIAGNOSIS — H2513 Age-related nuclear cataract, bilateral: Secondary | ICD-10-CM | POA: Diagnosis not present

## 2022-03-23 NOTE — Progress Notes (Signed)
? ?Ryan Fuller - 56 y.o. male MRN [id number]  Date of birth: 29-Dec-1965 ? ?Office Visit Note: ?Visit Date: 03/17/2022 ?PCP: [name], MD ?Referred by: Bo Merino, MD ? ?Subjective: ?Chief Complaint  ?Patient presents with  ? Right Hand - Numbness, Pain  ? Left Hand - Numbness, Pain  ? Right Wrist - Numbness, Pain  ? Left Wrist - Numbness, Pain  ? ?HPI:  Ryan Fuller is a 56 y.o. male who comes in today at the request of Dr. Bo Merino for electrodiagnostic study of the Bilateral upper extremities.  Patient is Right hand dominant.  He is present with his wife today and complains of chronic worsening hand pain with numbness bilaterally in both hands fairly equally.  The numbness and tingling is really more in the middle fourth and fifth digits.  This is somewhat nondermatomal but could be more ulnar nerve versus C7 radicular complaints.  He has a history of prior cervical fusion and surgery by both Dr. Carloyn Manner and Dr. Ellene Route.  He denies any specific radicular complaints but does have shoulder pain and multiple joint pain.  Dr. Estanislado Pandy has completed subacromial injection of both shoulders and did seem to help.  He has a history of multiple joint arthritis and multiple joint complaints. ? ?Dr. Arlean Hopping last notes focus on the shoulders arms and hands an electrodiagnostic study of both upper extremities was requested.  Unfortunately it looks like the patient called and sometime after that requesting studies of both legs and at least per the messages this was requested but never made it to our office.  Despite that fact, we are unable to do forelimb electrodiagnostic studies as these usually are something needs to be done by neurology if there is any thought process of needing for limb electrodiagnostic studies.  I have asked him to talk to Dr. Estanislado Pandy about neurology consultation. ? ?ROS Otherwise per HPI. ? ?Assessment & Plan: ?Visit Diagnoses:  ?  ICD-10-CM   ?1. Paresthesia of skin  R20.2 NCV  with EMG (electromyography)  ?  ?  ?Plan: Impression: ?The above electrodiagnostic study is ABNORMAL and reveals evidence of a moderate Bilateral median nerve entrapment at the wrist (carpal tunnel syndrome) affecting sensory and motor components.  ? ?There is no significant electrodiagnostic evidence of any other focal nerve entrapment, brachial plexopathy or cervical radiculopathy.  ? ?Recommendations: ?1.  Follow-up with referring physician. ?2.  Continue current management of symptoms. ?3.  Continue use of resting splint at night-time and as needed during the day. ?4.  Suggest surgical evaluation. ? ?Meds & Orders: No orders of the defined types were placed in this encounter. ?  ?Orders Placed This Encounter  ?Procedures  ? NCV with EMG (electromyography)  ?  ?Follow-up: Return for Ryan Blamer, MD.  ? ?Procedures: ?No procedures performed  ?EMG & NCV Findings: ?Evaluation of the left median motor and the right median motor nerves showed prolonged distal onset latency (L4.4, R4.8 ms) and decreased conduction velocity (Elbow-Wrist, L47, R48 m/s).  The left median (across palm) sensory and the right median (across palm) sensory nerves showed prolonged distal peak latency (Wrist, L4.5, R4.8 ms) and prolonged distal peak latency (Palm, L2.1, R2.3 ms).  The right ulnar sensory nerve showed reduced amplitude (9.6 ?V).  All remaining nerves (as indicated in the following tables) were within normal limits.  All left vs. right side differences were within normal limits.   ? ?All examined muscles (as indicated in the following table) showed no evidence of  electrical instability.   ? ?Impression: ?The above electrodiagnostic study is ABNORMAL and reveals evidence of a moderate Bilateral median nerve entrapment at the wrist (carpal tunnel syndrome) affecting sensory and motor components.  ? ?There is no significant electrodiagnostic evidence of any other focal nerve entrapment, brachial plexopathy or cervical  radiculopathy.  ? ?Recommendations: ?1.  Follow-up with referring physician. ?2.  Continue current management of symptoms. ?3.  Continue use of resting splint at night-time and as needed during the day. ?4.  Suggest surgical evaluation. ? ?___________________________ ?[name] ?Board Certified, Tax adviser of Physical Medicine and Rehabilitation ? ? ? ?Nerve Conduction Studies ?Anti Sensory Summary Table ? ? Stim Site NR Peak (ms) Norm Peak (ms) P-T Amp (?V) Norm P-T Amp Site1 Site2 Delta-P (ms) Dist (cm) Vel (m/s) Norm Vel (m/s)  ?Left Median Acr Palm Anti Sensory (2nd Digit)  30.5?C  ?Wrist    *4.5 <3.6 25.0 >10 Wrist Palm 2.4 0.0    ?Palm    *2.1 <2.0 17.4         ?Right Median Acr Palm Anti Sensory (2nd Digit)  30.3?C  ?Wrist    *4.8 <3.6 19.5 >10 Wrist Palm 2.5 0.0    ?Palm    *2.3 <2.0 6.4         ?Left Radial Anti Sensory (Base 1st Digit)  30.2?C  ?Wrist    2.0 <3.1 18.3  Wrist Base 1st Digit 2.0 0.0    ?Right Radial Anti Sensory (Base 1st Digit)  30.5?C  ?Wrist    2.2 <3.1 23.2  Wrist Base 1st Digit 2.2 0.0    ?Left Ulnar Anti Sensory (5th Digit)  30.7?C  ?Wrist    3.5 <3.7 20.0 >15.0 Wrist 5th Digit 3.5 14.0 40 >38  ?Right Ulnar Anti Sensory (5th Digit)  30.7?C  ?Wrist    3.6 <3.7 *9.6 >15.0 Wrist 5th Digit 3.6 14.0 39 >38  ? ?Motor Summary Table ? ? Stim Site NR Onset (ms) Norm Onset (ms) O-P Amp (mV) Norm O-P Amp Site1 Site2 Delta-0 (ms) Dist (cm) Vel (m/s) Norm Vel (m/s)  ?Left Median Motor (Abd Poll Brev)  30.3?C  ?Wrist    *4.4 <4.2 8.0 >5 Elbow Wrist 4.9 23.0 *47 >50  ?Elbow    9.3  7.9         ?Right Median Motor (Abd Poll Brev)  30.5?C  ?Wrist    *4.8 <4.2 6.8 >5 Elbow Wrist 4.8 23.0 *48 >50  ?Elbow    9.6  4.7         ?Left Ulnar Motor (Abd Dig Min)  30.6?C  ?Wrist    3.3 <4.2 10.2 >3 B Elbow Wrist 4.0 23.0 58 >53  ?B Elbow    7.3  8.2  A Elbow B Elbow 1.3 11.0 85 >53  ?A Elbow    8.6  8.8         ?Right Ulnar Motor (Abd Dig Min)  30.5?C  ?Wrist    3.4 <4.2 8.7 >3 B Elbow Wrist 4.0  23.0 58 >53  ?B Elbow    7.4  11.2  A Elbow B Elbow 1.6 11.0 69 >53  ?A Elbow    9.0  10.4         ? ?EMG ? ? Side Muscle Nerve Root Ins Act Fibs Psw Amp Dur Poly Recrt Int Fraser Din Comment  ?Left 1stDorInt Ulnar C8-T1 Nml Nml Nml Nml Nml 0 Nml Nml   ?Left Abd Poll Brev Median C8-T1 Nml Nml Nml Nml  Nml 0 Nml Nml   ?Left ExtDigCom   Nml Nml Nml Nml Nml 0 Nml Nml   ?Left Triceps Radial C6-7-8 Nml Nml Nml Nml Nml 0 Nml Nml   ?Left Deltoid Axillary C5-6 Nml Nml Nml Nml Nml 0 Nml Nml   ? ? ?Nerve Conduction Studies ?Anti Sensory Left/Right Comparison ? ? Stim Site L Lat (ms) R Lat (ms) L-R Lat (ms) L Amp (?V) R Amp (?V) L-R Amp (%) Site1 Site2 L Vel (m/s) R Vel (m/s) L-R Vel (m/s)  ?Median Acr Palm Anti Sensory (2nd Digit)  30.5?C  ?Wrist *4.5 *4.8 0.3 25.0 19.5 22.0 Wrist Palm     ?Palm *2.1 *2.3 0.2 17.4 6.4 63.2       ?Radial Anti Sensory (Base 1st Digit)  30.2?C  ?Wrist 2.0 2.2 0.2 18.3 23.2 21.1 Wrist Base 1st Digit     ?Ulnar Anti Sensory (5th Digit)  30.7?C  ?Wrist 3.5 3.6 0.1 20.0 *9.6 52.0 Wrist 5th Digit 40 39 1  ? ?Motor Left/Right Comparison ? ? Stim Site L Lat (ms) R Lat (ms) L-R Lat (ms) L Amp (mV) R Amp (mV) L-R Amp (%) Site1 Site2 L Vel (m/s) R Vel (m/s) L-R Vel (m/s)  ?Median Motor (Abd Poll Brev)  30.3?C  ?Wrist *4.4 *4.8 0.4 8.0 6.8 15.0 Elbow Wrist *47 *48 1  ?Elbow 9.3 9.6 0.3 7.9 4.7 40.5       ?Ulnar Motor (Abd Dig Min)  30.6?C  ?Wrist 3.3 3.4 0.1 10.2 8.7 14.7 B Elbow Wrist 58 58 0  ?B Elbow 7.3 7.4 0.1 8.2 11.2 26.8 A Elbow B Elbow 85 69 16  ?A Elbow 8.6 9.0 0.4 8.8 10.4 15.4       ? ? ? ?Waveforms: ?    ? ?    ? ?    ? ?  ? ?  ? ?Clinical History: ?MRI CERVICAL SPINE WITHOUT CONTRAST ?  ?TECHNIQUE: ?Multiplanar, multisequence MR imaging of the cervical spine was ?performed. No intravenous contrast was administered. ?  ?COMPARISON:  MRI of the cervical spine 06/26/2021 ?  ?FINDINGS: ?Alignment: No significant listhesis is present. Straightening of the ?normal cervical lordosis is stable. ?   ?Vertebrae: Marrow changes associated with fusion C4-7 stable. ?Minimal endplate edema inferiorly at C3 stable. Marrow signal and ?vertebral body heights are otherwise normal. ?  ?Cord: Normal signal and morphology. ?  ?Post

## 2022-03-23 NOTE — Procedures (Signed)
EMG & NCV Findings: ?Evaluation of the left median motor and the right median motor nerves showed prolonged distal onset latency (L4.4, R4.8 ms) and decreased conduction velocity (Elbow-Wrist, L47, R48 m/s).  The left median (across palm) sensory and the right median (across palm) sensory nerves showed prolonged distal peak latency (Wrist, L4.5, R4.8 ms) and prolonged distal peak latency (Palm, L2.1, R2.3 ms).  The right ulnar sensory nerve showed reduced amplitude (9.6 ?V).  All remaining nerves (as indicated in the following tables) were within normal limits.  All left vs. right side differences were within normal limits.   ? ?All examined muscles (as indicated in the following table) showed no evidence of electrical instability.   ? ?Impression: ?The above electrodiagnostic study is ABNORMAL and reveals evidence of a moderate Bilateral median nerve entrapment at the wrist (carpal tunnel syndrome) affecting sensory and motor components.  ? ?There is no significant electrodiagnostic evidence of any other focal nerve entrapment, brachial plexopathy or cervical radiculopathy.  ? ?Recommendations: ?1.  Follow-up with referring physician. ?2.  Continue current management of symptoms. ?3.  Continue use of resting splint at night-time and as needed during the day. ?4.  Suggest surgical evaluation. ? ?___________________________ ?[name] ?Board Certified, Tax adviser of Physical Medicine and Rehabilitation ? ? ? ?Nerve Conduction Studies ?Anti Sensory Summary Table ? ? Stim Site NR Peak (ms) Norm Peak (ms) P-T Amp (?V) Norm P-T Amp Site1 Site2 Delta-P (ms) Dist (cm) Vel (m/s) Norm Vel (m/s)  ?Left Median Acr Palm Anti Sensory (2nd Digit)  30.5?C  ?Wrist    *4.5 <3.6 25.0 >10 Wrist Palm 2.4 0.0    ?Palm    *2.1 <2.0 17.4         ?Right Median Acr Palm Anti Sensory (2nd Digit)  30.3?C  ?Wrist    *4.8 <3.6 19.5 >10 Wrist Palm 2.5 0.0    ?Palm    *2.3 <2.0 6.4         ?Left Radial Anti Sensory (Base 1st Digit)   30.2?C  ?Wrist    2.0 <3.1 18.3  Wrist Base 1st Digit 2.0 0.0    ?Right Radial Anti Sensory (Base 1st Digit)  30.5?C  ?Wrist    2.2 <3.1 23.2  Wrist Base 1st Digit 2.2 0.0    ?Left Ulnar Anti Sensory (5th Digit)  30.7?C  ?Wrist    3.5 <3.7 20.0 >15.0 Wrist 5th Digit 3.5 14.0 40 >38  ?Right Ulnar Anti Sensory (5th Digit)  30.7?C  ?Wrist    3.6 <3.7 *9.6 >15.0 Wrist 5th Digit 3.6 14.0 39 >38  ? ?Motor Summary Table ? ? Stim Site NR Onset (ms) Norm Onset (ms) O-P Amp (mV) Norm O-P Amp Site1 Site2 Delta-0 (ms) Dist (cm) Vel (m/s) Norm Vel (m/s)  ?Left Median Motor (Abd Poll Brev)  30.3?C  ?Wrist    *4.4 <4.2 8.0 >5 Elbow Wrist 4.9 23.0 *47 >50  ?Elbow    9.3  7.9         ?Right Median Motor (Abd Poll Brev)  30.5?C  ?Wrist    *4.8 <4.2 6.8 >5 Elbow Wrist 4.8 23.0 *48 >50  ?Elbow    9.6  4.7         ?Left Ulnar Motor (Abd Dig Min)  30.6?C  ?Wrist    3.3 <4.2 10.2 >3 B Elbow Wrist 4.0 23.0 58 >53  ?B Elbow    7.3  8.2  A Elbow B Elbow 1.3 11.0 85 >53  ?A Elbow  8.6  8.8         ?Right Ulnar Motor (Abd Dig Min)  30.5?C  ?Wrist    3.4 <4.2 8.7 >3 B Elbow Wrist 4.0 23.0 58 >53  ?B Elbow    7.4  11.2  A Elbow B Elbow 1.6 11.0 69 >53  ?A Elbow    9.0  10.4         ? ?EMG ? ? Side Muscle Nerve Root Ins Act Fibs Psw Amp Dur Poly Recrt Int Fraser Din Comment  ?Left 1stDorInt Ulnar C8-T1 Nml Nml Nml Nml Nml 0 Nml Nml   ?Left Abd Poll Brev Median C8-T1 Nml Nml Nml Nml Nml 0 Nml Nml   ?Left ExtDigCom   Nml Nml Nml Nml Nml 0 Nml Nml   ?Left Triceps Radial C6-7-8 Nml Nml Nml Nml Nml 0 Nml Nml   ?Left Deltoid Axillary C5-6 Nml Nml Nml Nml Nml 0 Nml Nml   ? ? ?Nerve Conduction Studies ?Anti Sensory Left/Right Comparison ? ? Stim Site L Lat (ms) R Lat (ms) L-R Lat (ms) L Amp (?V) R Amp (?V) L-R Amp (%) Site1 Site2 L Vel (m/s) R Vel (m/s) L-R Vel (m/s)  ?Median Acr Palm Anti Sensory (2nd Digit)  30.5?C  ?Wrist *4.5 *4.8 0.3 25.0 19.5 22.0 Wrist Palm     ?Palm *2.1 *2.3 0.2 17.4 6.4 63.2       ?Radial Anti Sensory (Base 1st Digit)  30.2?C   ?Wrist 2.0 2.2 0.2 18.3 23.2 21.1 Wrist Base 1st Digit     ?Ulnar Anti Sensory (5th Digit)  30.7?C  ?Wrist 3.5 3.6 0.1 20.0 *9.6 52.0 Wrist 5th Digit 40 39 1  ? ?Motor Left/Right Comparison ? ? Stim Site L Lat (ms) R Lat (ms) L-R Lat (ms) L Amp (mV) R Amp (mV) L-R Amp (%) Site1 Site2 L Vel (m/s) R Vel (m/s) L-R Vel (m/s)  ?Median Motor (Abd Poll Brev)  30.3?C  ?Wrist *4.4 *4.8 0.4 8.0 6.8 15.0 Elbow Wrist *47 *48 1  ?Elbow 9.3 9.6 0.3 7.9 4.7 40.5       ?Ulnar Motor (Abd Dig Min)  30.6?C  ?Wrist 3.3 3.4 0.1 10.2 8.7 14.7 B Elbow Wrist 58 58 0  ?B Elbow 7.3 7.4 0.1 8.2 11.2 26.8 A Elbow B Elbow 85 69 16  ?A Elbow 8.6 9.0 0.4 8.8 10.4 15.4       ? ? ? ?Waveforms: ?    ? ?    ? ?    ? ?  ? ? ?

## 2022-03-24 ENCOUNTER — Encounter: Payer: Self-pay | Admitting: Neurology

## 2022-03-24 ENCOUNTER — Telehealth: Payer: Self-pay | Admitting: *Deleted

## 2022-03-24 DIAGNOSIS — R200 Anesthesia of skin: Secondary | ICD-10-CM

## 2022-03-24 DIAGNOSIS — G5603 Carpal tunnel syndrome, bilateral upper limbs: Secondary | ICD-10-CM

## 2022-03-24 DIAGNOSIS — R202 Paresthesia of skin: Secondary | ICD-10-CM

## 2022-03-24 NOTE — Telephone Encounter (Signed)
Patient advised Bilateral moderate carpal tunnel syndrome was noted on the nerve conduction velocities.  Patient advised he may benefit from cortisone injection. Patient advised if he wants to try cortisone injection then we can schedule an appointment for ultrasound-guided cortisone injection.  Other option could be surgery for carpal tunnel release. Patient would like to go ahead with surgery for carpal tunnel release. Patient advised we would place referral to Ortho. Patient requested referral to [facility]. Patient also advised we would have to send him to another facility to have the NCV test on his lower extremities. Patient is in agreement stating he has a lot of numbness in his feet.  ?

## 2022-03-24 NOTE — Telephone Encounter (Signed)
-----   Message from Bo Merino, MD sent at 03/23/2022  5:07 PM EDT ----- ?Bilateral moderate carpal tunnel syndrome was noted on the nerve conduction velocities.  Patient may benefit from cortisone injection.  If  patient wants to try cortisone injection then you may schedule an appointment for ultrasound-guided cortisone injection.  Other option could be surgery for carpal tunnel release. ?Bo Merino, MD  ? ?----- Message ----- ?From: Magnus Sinning, MD ?Sent: 03/23/2022   5:49 AM EDT ?To: Bo Merino, MD ? ? ?

## 2022-04-09 ENCOUNTER — Encounter: Payer: Self-pay | Admitting: Orthopedic Surgery

## 2022-04-09 ENCOUNTER — Ambulatory Visit (INDEPENDENT_AMBULATORY_CARE_PROVIDER_SITE_OTHER): Payer: Medicare HMO

## 2022-04-09 ENCOUNTER — Ambulatory Visit: Payer: Medicare HMO | Admitting: Orthopedic Surgery

## 2022-04-09 VITALS — Ht 72.0 in | Wt 230.0 lb

## 2022-04-09 DIAGNOSIS — M25511 Pain in right shoulder: Secondary | ICD-10-CM | POA: Diagnosis not present

## 2022-04-09 NOTE — Progress Notes (Signed)
Office Visit Note   Patient: Ryan Fuller           Date of Birth: 03/12/1966           MRN: [id number] Visit Date: 04/09/2022 Requested by: Bo Merino, [address],  [place] [address] PCP: [name], MD  Subjective: Chief Complaint  Patient presents with   Right Hand - Numbness, Pain   Left Hand - Numbness, Pain    HPI: Ryan Fuller is a patient who describes multiple orthopedic complaints today.  He reports bilateral hand numbness and tingling right worse than left.  This has been going on now for many months.  He reports really dropping things on a daily basis as well as numbness and tingling on a daily basis.  He does have a history of cervical spine surgery x2.  The last surgery was 4 years ago.  The dropping and loss of dexterity in the hands dates back 1 to 2 years.  Denies any dorsal numbness and tingling in either hand.  States that his hands are generally okay at night but definitely numb when he wakes up in the morning.  Patient also reports right shoulder pain.  He was working with a pair pliers and hold punches about 2 weeks ago and felt a pop in the shoulder and has had fairly profound weakness and pain since that time which actually is worse than his hand symptoms.  Does describe a history of right shoulder surgery when he was in his 62s.  Old notes are reviewed.  He has had AC joint injections in both shoulders in the past.              ROS: All systems reviewed are negative as they relate to the chief complaint within the history of present illness.  Patient denies  fevers or chills.   Assessment & Plan: Visit Diagnoses:  1. Acute pain of right shoulder     Plan: Impression is bilateral carpal tunnel syndrome with moderate symptoms by EMG nerve study performed by Dr. Ernestina Patches.  I think this looks like a surgical problem based on loss of dexterity as well as chronicity of symptoms.  The EMG portion did not pick up any radicular component to  either hand.  No dorsal numbness by history.  Carpal tunnel surgery has of relatively good likelihood of helping him with his grip strength and loss of dexterity.  In addition I think he does have some narrowing of the acromiohumeral distance on his plain shoulder radiographs.  Also has weakness to infraspinatus testing on the right compared to the left.  This may be an acute problem or acute on chronic problem which is more likely.  Plan MRI arthrogram of the right shoulder to evaluate rotator cuff pathology.  No injection performed today based on possibility of surgical intervention this year.  Follow-up after that study.  To wrist plans also provided to help with any night symptoms that he might have.  Decision point on the return visit after MRI scan will be for or against right carpal tunnel release as well as for or against injection versus surgical intervention for whenever pathology is discovered on the MRI scan.  Follow-Up Instructions: Return for after MRI.   Orders:  Orders Placed This Encounter  Procedures   XR Shoulder Right   MR SHOULDER RIGHT W CONTRAST   DL FLUORO GUIDED NEEDLE PLC ASPIRATION / INJECTTION/LOC   No orders of the defined types were placed  in this encounter.     Procedures: No procedures performed   Clinical Data: No additional findings.  Objective: Vital Signs: Ht 6' (1.829 m)   Wt 230 lb (104.3 kg)   BMI 31.19 kg/m   Physical Exam:   Constitutional: Patient appears well-developed HEENT:  Head: Normocephalic Eyes:EOM are normal Neck: Normal range of motion Cardiovascular: Normal rate Pulmonary/chest: Effort normal Neurologic: Patient is alert Skin: Skin is warm Psychiatric: Patient has normal mood and affect   Ortho Exam: Ortho exam demonstrates positive carpal tunnel compression testing in both hands.  No ulnar nerve symptoms or subluxation on either elbow.  No muscle atrophy in either arm.  Neck range of motion slightly restricted with  rotation flexion and extension which is expected following his 2 surgeries.  No definite paresthesias C5-T1 but he does report some paresthesias in the fingertips of both hands.  Radial pulse intact bilaterally.  No abductor pollicis brevis wasting.  Shoulder exam demonstrates weakness to infraspinatus testing on the right 4 out of 5 compared to the left.  Passive range of motion is 55/95/140 bilaterally.  He does have a little bit of guarding with the shoulder exam.  No discrete AC joint tenderness today.  Not too much in terms of coarse grinding or crepitus with internal/external rotation of the shoulder.  Specialty Comments:  MRI CERVICAL SPINE WITHOUT CONTRAST   TECHNIQUE: Multiplanar, multisequence MR imaging of the cervical spine was performed. No intravenous contrast was administered.   COMPARISON:  MRI of the cervical spine 06/26/2021   FINDINGS: Alignment: No significant listhesis is present. Straightening of the normal cervical lordosis is stable.   Vertebrae: Marrow changes associated with fusion C4-7 stable. Minimal endplate edema inferiorly at C3 stable. Marrow signal and vertebral body heights are otherwise normal.   Cord: Normal signal and morphology.   Posterior Fossa, vertebral arteries, paraspinal tissues: Craniocervical junction is normal. Flow is present in the vertebral arteries bilaterally. Visualized intracranial contents are normal.   Disc levels:   C2-3: Mild rightward disc osteophyte complex is present. Central canal is patent. Mild foraminal narrowing bilaterally is stable. Extraforaminal left synovial cyst again noted.   C3-4: Broad-based disc osteophyte complex is stable. Central canal is patent. Facet hypertrophy is worse on the left. Moderate left and mild right foraminal narrowing is stable.   C4-5: Solid fusion is present anteriorly. No residual recurrent stenosis or change is present.   C5-6: Solid anterior fusion is present. The central canal  is patent. Moderate right and mild left osseous foraminal narrowing is stable.   C6-7: Solid anterior fusion is present. No residual recurrent stenosis is present.   C7-T1: A right paramedian soft disc protrusion is again noted. This potentially contacts the ventral surface of the cord. The foramina are patent bilaterally.   IMPRESSION: 1. Stable anterior fusion at C4-5, C5-6, and C6-7. 2. Stable moderate right and mild left osseous foraminal narrowing at C5-6. 3. Stable right paramedian soft disc protrusion at C7-T1 potentially contacts the ventral surface of the cord without significant foraminal stenosis. 4. Stable moderate left and mild right foraminal narrowing at C3-4. 5. Mild foraminal narrowing bilaterally at C2-3 is stable.     Electronically Signed   By: San Morelle M.D.   On: 10/17/2021 17:06  Imaging: XR Shoulder Right  Result Date: 04/09/2022 [facility] axillary outlet radiographs right shoulder reviewed.  There is some narrowing of the acromiohumeral distance.  No acute fracture.  Enthesopathic changes noted at the rotator cuff insertion.  No glenohumeral  joint arthritis.  Moderate AC joint arthritis.  Visualized lung fields clear.    PMFS History: Patient Active Problem List   Diagnosis Date Noted   DDD (degenerative disc disease), cervical 02/04/2022   DDD (degenerative disc disease), lumbar 02/04/2022   Primary osteoarthritis of both hands 02/04/2022   Arthritis of both acromioclavicular joints 02/04/2022   GERD (gastroesophageal reflux disease) 09/02/2021   Sigmoid diverticulitis 10/01/2019   Family hx of colon cancer 10/10/2018   Rectal bleeding 10/10/2018   History of colonic polyps 10/10/2018   Gout 08/10/2017   Unspecified constipation 02/21/2013   Past Medical History:  Diagnosis Date   Dysphagia 08/10/2017   Gout    Gout 08/10/2017   Iritis    dx by Dr. Jorja Loa   Kidney stones     Family History  Problem Relation Age of Onset   Dementia  Mother    Kidney cancer Father    Kidney failure Brother    Congestive Heart Failure Brother    Healthy Brother    Heart Problems Son    Healthy Son    Healthy Daughter     Past Surgical History:  Procedure Laterality Date   BACK SURGERY     BIOPSY  11/13/2018   Procedure: BIOPSY;  Surgeon: Rogene Houston, MD;  [facility]: [facility];  Service: Endoscopy;;  terminal ileum colon   COLONOSCOPY N/A 03/01/2013   Procedure: COLONOSCOPY;  Surgeon: Rogene Houston, MD;  Location: [facility];  Service: Endoscopy;  Laterality: N/A;  100   COLONOSCOPY N/A 11/13/2018   Procedure: COLONOSCOPY;  Surgeon: Rogene Houston, MD;  Location: [facility];  Service: Endoscopy;  Laterality: N/A;  1015am   EGD/ED     years ago   ESOPHAGEAL DILATION N/A 08/31/2017   Procedure: ESOPHAGEAL DILATION;  Surgeon: Rogene Houston, MD;  [facility]: [facility];  Service: Endoscopy;  Laterality: N/A;   ESOPHAGOGASTRODUODENOSCOPY N/A 08/31/2017   Procedure: ESOPHAGOGASTRODUODENOSCOPY (EGD);  Surgeon: Rogene Houston, MD;  [facility]: [facility];  Service: Endoscopy;  Laterality: N/A;  3:10   NECK SURGERY     x2   POLYPECTOMY  11/13/2018   Procedure: POLYPECTOMY;  Surgeon: Rogene Houston, MD;  Location: [facility];  Service: Endoscopy;;  colon   Rotator cuff surgery Right    Social History   Occupational History   Not on file  Tobacco Use   Smoking status: Never    Passive exposure: Past   Smokeless tobacco: Never  Vaping Use   Vaping Use: Never used  Substance and Sexual Activity   Alcohol use: No   Drug use: No   Sexual activity: Not on file

## 2022-04-14 DIAGNOSIS — G894 Chronic pain syndrome: Secondary | ICD-10-CM | POA: Diagnosis not present

## 2022-04-14 DIAGNOSIS — G2581 Restless legs syndrome: Secondary | ICD-10-CM | POA: Diagnosis not present

## 2022-04-14 DIAGNOSIS — M5136 Other intervertebral disc degeneration, lumbar region: Secondary | ICD-10-CM | POA: Diagnosis not present

## 2022-04-14 DIAGNOSIS — M5030 Other cervical disc degeneration, unspecified cervical region: Secondary | ICD-10-CM | POA: Diagnosis not present

## 2022-04-23 ENCOUNTER — Ambulatory Visit
Admission: RE | Admit: 2022-04-23 | Discharge: 2022-04-23 | Disposition: A | Discharge status: Home / Self Care | Payer: Medicare HMO | Source: Ambulatory Visit | Attending: Orthopedic Surgery | Admitting: Orthopedic Surgery

## 2022-04-23 DIAGNOSIS — M25511 Pain in right shoulder: Secondary | ICD-10-CM | POA: Diagnosis not present

## 2022-04-23 DIAGNOSIS — G8929 Other chronic pain: Secondary | ICD-10-CM | POA: Diagnosis not present

## 2022-04-23 DIAGNOSIS — S46011A Strain of muscle(s) and tendon(s) of the rotator cuff of right shoulder, initial encounter: Secondary | ICD-10-CM | POA: Diagnosis not present

## 2022-04-23 MED ORDER — IOPAMIDOL (ISOVUE-M 200) INJECTION 41%
15.0000 mL | Freq: Once | INTRAMUSCULAR | Status: AC
  Administered 2022-04-23: 15 mL via INTRA_ARTICULAR

## 2022-05-04 NOTE — Progress Notes (Unsigned)
Office Visit Note  Patient: Ryan Fuller             Date of Birth: 1966-02-13           MRN: [id number]             PCP: [name], MD Referring: [name], MD Visit Date: 05/13/2022 Occupation: '@GUAROCC'$ @  Subjective:  Pain of the Right Shoulder (Scheduled for surgery in July ) and Pain of the Left Shoulder   History of Present Illness: Ryan Fuller is a 56 y.o. male with history of osteoarthritis.  He states he was evaluated by Dr. Marlou Sa for the right shoulder joint pain.  MRI revealed complete rotator cuff tear.  He is scheduled to have right shoulder joint surgery on July 17.  He continues to have numbness in his bilateral hands.  They did not discuss carpal tunnel release surgery yet.  He has been also experiencing pain and discomfort in his left shoulder joint.  He had no gout flares since the last visit.  He is on allopurinol by his PCP.  He recently was evaluated at [facility] and had no flares of iritis.  Activities of Daily Living:  Patient reports morning stiffness for 1 hour.   Patient Reports nocturnal pain.  Difficulty dressing/grooming: Reports Difficulty climbing stairs: Denies Difficulty getting out of chair: Denies Difficulty using hands for taps, buttons, cutlery, and/or writing: Reports  Review of Systems  Constitutional:  Negative for fatigue.  HENT:  Negative for mouth sores and mouth dryness.   Eyes: Negative.  Negative for dryness.  Respiratory:  Negative for shortness of breath.   Cardiovascular: Negative.  Negative for chest pain, irregular heartbeat and swelling in legs/feet.  Gastrointestinal:  Negative for constipation and diarrhea.  Endocrine: Negative.  Negative for cold intolerance and heat intolerance.  Genitourinary: Negative.   Musculoskeletal:  Positive for joint pain, joint pain and morning stiffness. Negative for joint swelling, myalgias and myalgias.  Skin: Negative.  Negative for rash.  Allergic/Immunologic: Negative.    Neurological:  Positive for numbness and weakness. Negative for dizziness, fainting and headaches.  Hematological:  Negative for bruising/bleeding tendency and swollen glands.  Psychiatric/Behavioral: Negative.      PMFS History:  Patient Active Problem List   Diagnosis Date Noted   DDD (degenerative disc disease), cervical 02/04/2022   DDD (degenerative disc disease), lumbar 02/04/2022   Primary osteoarthritis of both hands 02/04/2022   Arthritis of both acromioclavicular joints 02/04/2022   GERD (gastroesophageal reflux disease) 09/02/2021   Sigmoid diverticulitis 10/01/2019   Family hx of colon cancer 10/10/2018   Rectal bleeding 10/10/2018   History of colonic polyps 10/10/2018   Gout 08/10/2017   Unspecified constipation 02/21/2013    Past Medical History:  Diagnosis Date   Dysphagia 08/10/2017   Gout    Gout 08/10/2017   Iritis    dx by Dr. Jorja Loa   Kidney stones     Family History  Problem Relation Age of Onset   Dementia Mother    Kidney cancer Father    Kidney failure Brother    Congestive Heart Failure Brother    Healthy Brother    Heart Problems Son    Healthy Son    Healthy Daughter    Past Surgical History:  Procedure Laterality Date   BACK SURGERY     BIOPSY  11/13/2018   Procedure: BIOPSY;  Surgeon: Rogene Houston, MD;  Location: [facility];  Service: Endoscopy;;  terminal ileum  colon   COLONOSCOPY N/A 03/01/2013   Procedure: COLONOSCOPY;  Surgeon: Rogene Houston, MD;  Location: [facility];  Service: Endoscopy;  Laterality: N/A;  100   COLONOSCOPY N/A 11/13/2018   Procedure: COLONOSCOPY;  Surgeon: Rogene Houston, MD;  Location: [facility];  Service: Endoscopy;  Laterality: N/A;  1015am   EGD/ED     years ago   ESOPHAGEAL DILATION N/A 08/31/2017   Procedure: ESOPHAGEAL DILATION;  Surgeon: Rogene Houston, MD;  Location: [facility];  Service: Endoscopy;  Laterality: N/A;   ESOPHAGOGASTRODUODENOSCOPY N/A 08/31/2017   Procedure:  ESOPHAGOGASTRODUODENOSCOPY (EGD);  Surgeon: Rogene Houston, MD;  Location: [facility];  Service: Endoscopy;  Laterality: N/A;  3:10   NECK SURGERY     x2   POLYPECTOMY  11/13/2018   Procedure: POLYPECTOMY;  Surgeon: Rogene Houston, MD;  Location: [facility];  Service: Endoscopy;;  colon   Rotator cuff surgery Right    Social History   Social History Narrative   Not on file    There is no immunization history on file for this patient.   Objective: Vital Signs: BP 132/84   Pulse 90   Ht 6' (1.829 m)   Wt 235 lb (106.6 kg)   BMI 31.87 kg/m    Physical Exam Vitals and nursing note reviewed.  Constitutional:      Appearance: He is well-developed.  HENT:     Head: Normocephalic and atraumatic.  Eyes:     Conjunctiva/sclera: Conjunctivae normal.     Pupils: Pupils are equal, round, and reactive to light.  Cardiovascular:     Rate and Rhythm: Normal rate and regular rhythm.     Heart sounds: Normal heart sounds.  Pulmonary:     Effort: Pulmonary effort is normal.     Breath sounds: Normal breath sounds.  Abdominal:     General: Bowel sounds are normal.     Palpations: Abdomen is soft.  Musculoskeletal:     Cervical back: Normal range of motion and neck supple.  Skin:    General: Skin is warm and dry.     Capillary Refill: Capillary refill takes less than 2 seconds.  Neurological:     Mental Status: He is alert and oriented to person, place, and time.  Psychiatric:        Behavior: Behavior normal.      Musculoskeletal Exam: Limited lateral rotation of the cervical spine especially on the right side was noted.  Right shoulder abduction was limited to about 60 degrees.  Left shoulder joint abduction was complete but painful.  He had painful internal rotation of his left shoulder.  Elbow joints in good range of motion.  Bilateral PIP and DIP thickening with no synovitis was noted.  Hip joints in good range of motion without discomfort.  Knee joints with good range  of motion without any warmth swelling or effusion.  There was no tenderness over ankles or MTPs.  CDAI Exam: CDAI Score: -- Patient Global: --; Provider Global: -- Swollen: --; Tender: -- Joint Exam 05/13/2022   No joint exam has been documented for this visit   There is currently no information documented on the homunculus. Go to the Rheumatology activity and complete the homunculus joint exam.  Investigation: No additional findings.  Imaging: MR SHOULDER RIGHT W CONTRAST  Result Date: 04/26/2022 CLINICAL DATA:  Evaluate for rotator cuff tear especially of the infraspinatus. Right shoulder pain radiating to the arm with decreased range of motion.  Weakness and numbness for 2 months. Prior rotator cuff repair 25 years ago. EXAM: MRI OF THE RIGHT SHOULDER WITH CONTRAST TECHNIQUE: Multiplanar, multisequence MR imaging of the right shoulder was performed following the administration of intra-articular contrast. No ABER images were obtained due to patient pain. CONTRAST:  See Injection Documentation. COMPARISON:  Right humerus radiographs 04/09/2022 FINDINGS: Rotator cuff: There is a large full-thickness tear of at least 90% of the AP dimension of the supraspinatus tendon with possible mild far anterior intact fibers (coronal series 7 images 13 and 14). There is a full-thickness tear of at least 90% of the AP dimension of the infraspinatus tendon with a small amount of posterior intact fibers (coronal series 7, images 5 and 6). There is retraction of the bursal sided supraspinatus and infraspinatus tendon fibers at least 3.0 cm and greater retraction of the articular sided fibers, at least 4 cm. There is a full-thickness tear of the superior 50% of the subscapularis tendon insertion with mild interstitial midsubstance partial-thickness tearing of the more inferior tendon (axial series 3 images 13 through 18). The teres minor is intact. Muscles: Mild supraspinatus and infraspinatus and mild superior  subscapularis muscle atrophy. Biceps long head: Mild-to-moderate intermediate T2 signal tendinosis of the proximal long head of the biceps tendon. There is a moderately attenuated tendon seen coursing over the lesser tuberosity into the bicipital groove (axial series 3, images 16 through 19, moderate partial-thickness tearing. Acromioclavicular Joint: There are moderate to severe degenerative changes of the acromioclavicular joint including joint space narrowing, subchondral marrow edema, and peripheral osteophytosis. Type I to II acromion. The intra-articular contrast administered into the glenohumeral joint for this MR arthrogram extends through the full-thickness rotator cuff tears into the subacromial/subdeltoid bursa. Glenohumeral Joint: Mild glenoid and humeral head cartilage thinning. Labrum: Mild degenerative irregularity of the superior glenoid labrum. Minimal degenerative blunting of the peripheral aspect of the posterosuperior glenoid labrum. Bones:  No acute fracture. Other: None. IMPRESSION: 1. Large full-thickness tear of near the entire supraspinatus and infraspinatus tendons with a small amount of anterior supraspinatus and posterior infraspinatus intact fibers. Mild supraspinatus and infraspinatus muscle atrophy. 2. Full-thickness tear of the superior 50% of the subscapularis tendon insertion. Mild subscapularis muscle atrophy. 3. Mild-to-moderate proximal long head of the biceps tendinosis with moderate partial-thickness tear of the tendon as it courses over the lesser tuberosity into the bicipital groove. 4. Moderate to severe degenerative changes of the acromioclavicular joint. 5. Mild glenohumeral osteoarthritis and mild degenerative changes of the superior and posterosuperior portions of the glenoid labrum. Electronically Signed   By: Yvonne Kendall M.D.   On: 04/26/2022 09:13   DL FLUORO GUIDED NEEDLE PLC ASPIRATION / INJECTTION/LOC  Result Date: 04/23/2022 CLINICAL DATA:  Chronic right  shoulder pain. EXAM: SHOULDER INJECTION FOR MRI FLUOROSCOPY: Radiation exposure index: 3.2 mGy reference air kerma PROCEDURE: After a thorough discussion of risks and benefits of the procedure, written and oral informed consent was obtained. The consent discussion included the risk of bleeding, infection and injury to nerves and adjacent blood vessels. Extra-articular injection was also a possible risk discussed. Preliminary localization was performed over the right shoulder. The area was marked over superior medial anterior humeral head. After prep and drape in the usual sterile fashion, the skin and deeper subcutaneous tissues were anesthetized with 1% Lidocaine without Epinephrine. Under fluoroscopic guidance, a 22 gauge 3.5 inch spinal needle was advanced into the joint over the superior medial anterior humeral head using an anterior approach. Intra-articular injection of Lidocaine was  performed which flowed freely and subsequently the joint was distended with 15 ml of a 1:200 dilution of Multihance contrast. The MR arthrogram solution was as follows: 10 mL Isovue-300 contrast agent, 0.1 mL Multihance, 10 mL saline. An end point was felt as well as the patient experiencing pressure and the injection was discontinued, the needle removed, and a sterile dressing applied. The patient was taken to MRI for subsequent imaging. The patient tolerated the procedure well and there were no complications. IMPRESSION: Successful right shoulder fluoroscopically guided injection. Electronically Signed   By: Jacqulynn Cadet M.D.   On: 04/23/2022 16:06    Recent Labs: Lab Results  Component Value Date   WBC 4.9 01/14/2022   HGB 15.0 01/14/2022   PLT 251 01/14/2022   NA 137 01/14/2022   K 4.5 01/14/2022   CL 101 01/14/2022   CO2 27 01/14/2022   GLUCOSE 98 01/14/2022   BUN 15 01/14/2022   CREATININE 1.07 01/14/2022   BILITOT 0.7 01/14/2022   ALKPHOS 70 05/27/2016   AST 26 01/14/2022   ALT 27 01/14/2022   PROT  7.5 01/14/2022   ALBUMIN 4.4 05/27/2016   CALCIUM 10.4 (H) 01/14/2022   GFRAA >60 05/27/2016     Speciality Comments: No specialty comments available.  Procedures:  Large Joint Inj: L glenohumeral on 05/13/2022 2:21 PM Indications: pain Details: 27 G 1.5 in needle, posterior approach  Arthrogram: No  Medications: 1.5 mL lidocaine 1 %; 40 mg triamcinolone acetonide 40 MG/ML Aspirate: 0 mL Outcome: tolerated well, no immediate complications Procedure, treatment alternatives, risks and benefits explained, specific risks discussed. Consent was given by the patient. Immediately prior to procedure a time out was called to verify the correct patient, procedure, equipment, support staff and site/side marked as required. Patient was prepped and draped in the usual sterile fashion.     Allergies: Phenergan [promethazine hcl], Colchicine, Penicillins, and Vancomycin   Assessment / Plan:     Visit Diagnoses: Complete tear of right rotator cuff, unspecified whether traumatic-patient was recently evaluated by Dr. Alphonzo Severance and had MRI of his shoulder joint.  MRI findings were reviewed.  He is scheduled to have rotator cuff tear repair on July 17.  He had very limited range of motion and discomfort.  Arthritis of both acromioclavicular joints-he continues to have discomfort in his left shoulder.  He had painful  full range of motion of his left shoulder joint.  He requested a cortisone injection.  After informed consent was obtained and side effects were discussed left shoulder joint was injected with lidocaine and cortisone as described below.  He tolerated the procedure well.  Postprocedure instructions were given.  A handout on shoulder joint exercises were also placed in the AVS.  Bilateral carpal tunnel syndrome-he continues to have numbness in his bilateral hands.  He will discuss carpal tunnel release with Dr. Marlou Sa.  Primary osteoarthritis of both hands-bilateral PIP and DIP thickening  consistent with osteoarthritis was noted.  Muscle strengthening was discussed.  Chronic pain of right knee-improved.  DDD (degenerative disc disease), cervical-he had cervical spine fusion x2 months by Dr. Carloyn Manner and later by Dr. Shan Levans.  He has limited range of motion and discomfort.  DDD (degenerative disc disease), lumbar-he has chronic discomfort in his lumbar region.he had discectomy in 2019 by Dr. Hal Neer.  He continues to have left-sided radiculopathy and numbness.    History of gout -  on allopurinol 200 mg p.o. daily by his PCP.  He denies any gout flare.  Uric acid was 6.0 on January 14, 2022.  History of iritis-no recent flares of arthritis per patient.January 14, 2022 sed rate 9, CK153, RF negative, anti-CCP negative  History of gastroesophageal reflux (GERD)  History of diverticulitis  Hx of colonic polyps  RLS (restless legs syndrome)  Orders: No orders of the defined types were placed in this encounter.  No orders of the defined types were placed in this encounter.    Follow-Up Instructions: Return for Osteoarthritis.   Bo Merino, MD  Note - This record has been created using Editor, commissioning.  Chart creation errors have been sought, but may not always  have been located. Such creation errors do not reflect on  the standard of medical care.

## 2022-05-07 ENCOUNTER — Ambulatory Visit: Payer: Medicare HMO | Admitting: Orthopedic Surgery

## 2022-05-07 DIAGNOSIS — M75121 Complete rotator cuff tear or rupture of right shoulder, not specified as traumatic: Secondary | ICD-10-CM | POA: Diagnosis not present

## 2022-05-08 ENCOUNTER — Encounter: Payer: Self-pay | Admitting: Orthopedic Surgery

## 2022-05-13 ENCOUNTER — Ambulatory Visit: Payer: Medicare HMO | Admitting: Rheumatology

## 2022-05-13 ENCOUNTER — Encounter: Payer: Self-pay | Admitting: Rheumatology

## 2022-05-13 VITALS — BP 132/84 | HR 90 | Ht 72.0 in | Wt 235.0 lb

## 2022-05-13 DIAGNOSIS — M19012 Primary osteoarthritis, left shoulder: Secondary | ICD-10-CM | POA: Diagnosis not present

## 2022-05-13 DIAGNOSIS — M75121 Complete rotator cuff tear or rupture of right shoulder, not specified as traumatic: Secondary | ICD-10-CM | POA: Diagnosis not present

## 2022-05-13 DIAGNOSIS — M19011 Primary osteoarthritis, right shoulder: Secondary | ICD-10-CM

## 2022-05-13 DIAGNOSIS — M5136 Other intervertebral disc degeneration, lumbar region: Secondary | ICD-10-CM

## 2022-05-13 DIAGNOSIS — R200 Anesthesia of skin: Secondary | ICD-10-CM

## 2022-05-13 DIAGNOSIS — Z8739 Personal history of other diseases of the musculoskeletal system and connective tissue: Secondary | ICD-10-CM | POA: Diagnosis not present

## 2022-05-13 DIAGNOSIS — G5603 Carpal tunnel syndrome, bilateral upper limbs: Secondary | ICD-10-CM | POA: Diagnosis not present

## 2022-05-13 DIAGNOSIS — Z8669 Personal history of other diseases of the nervous system and sense organs: Secondary | ICD-10-CM

## 2022-05-13 DIAGNOSIS — M5030 Other cervical disc degeneration, unspecified cervical region: Secondary | ICD-10-CM

## 2022-05-13 DIAGNOSIS — M25561 Pain in right knee: Secondary | ICD-10-CM

## 2022-05-13 DIAGNOSIS — M19041 Primary osteoarthritis, right hand: Secondary | ICD-10-CM

## 2022-05-13 DIAGNOSIS — Z8719 Personal history of other diseases of the digestive system: Secondary | ICD-10-CM

## 2022-05-13 DIAGNOSIS — Z860100 Personal history of colon polyps, unspecified: Secondary | ICD-10-CM

## 2022-05-13 DIAGNOSIS — M19042 Primary osteoarthritis, left hand: Secondary | ICD-10-CM

## 2022-05-13 DIAGNOSIS — M51369 Other intervertebral disc degeneration, lumbar region without mention of lumbar back pain or lower extremity pain: Secondary | ICD-10-CM

## 2022-05-13 DIAGNOSIS — G2581 Restless legs syndrome: Secondary | ICD-10-CM

## 2022-05-13 DIAGNOSIS — G8929 Other chronic pain: Secondary | ICD-10-CM

## 2022-05-13 MED ORDER — LIDOCAINE HCL 1 % IJ SOLN
1.5000 mL | INTRAMUSCULAR | Status: AC | PRN
  Administered 2022-05-13: 1.5 mL

## 2022-05-13 MED ORDER — TRIAMCINOLONE ACETONIDE 40 MG/ML IJ SUSP
40.0000 mg | INTRAMUSCULAR | Status: AC | PRN
  Administered 2022-05-13: 40 mg via INTRA_ARTICULAR

## 2022-05-13 NOTE — Patient Instructions (Signed)
Shoulder Exercises Ask your health care provider which exercises are safe for you. Do exercises exactly as told by your health care provider and adjust them as directed. It is normal to feel mild stretching, pulling, tightness, or discomfort as you do these exercises. Stop right away if you feel sudden pain or your pain gets worse. Do not begin these exercises until told by your health care provider. Stretching exercises External rotation and abduction This exercise is sometimes called corner stretch. This exercise rotates your arm outward (external rotation) and moves your arm out from your body (abduction). Stand in a doorway with one of your feet slightly in front of the other. This is called a staggered stance. If you cannot reach your forearms to the door frame, stand facing a corner of a room. Choose one of the following positions as told by your health care provider: Place your hands and forearms on the door frame above your head. Place your hands and forearms on the door frame at the height of your head. Place your hands on the door frame at the height of your elbows. Slowly move your weight onto your front foot until you feel a stretch across your chest and in the front of your shoulders. Keep your head and chest upright and keep your abdominal muscles tight. Hold for __________ seconds. To release the stretch, shift your weight to your back foot. Repeat __________ times. Complete this exercise __________ times a day. Extension, standing Stand and hold a broomstick, a cane, or a similar object behind your back. Your hands should be a little wider than shoulder width apart. Your palms should face away from your back. Keeping your elbows straight and your shoulder muscles relaxed, move the stick away from your body until you feel a stretch in your shoulders (extension). Avoid shrugging your shoulders while you move the stick. Keep your shoulder blades tucked down toward the middle of your  back. Hold for __________ seconds. Slowly return to the starting position. Repeat __________ times. Complete this exercise __________ times a day. Range-of-motion exercises Pendulum  Stand near a wall or a surface that you can hold onto for balance. Bend at the waist and let your left / right arm hang straight down. Use your other arm to support you. Keep your back straight and do not lock your knees. Relax your left / right arm and shoulder muscles, and move your hips and your trunk so your left / right arm swings freely. Your arm should swing because of the motion of your body, not because you are using your arm or shoulder muscles. Keep moving your hips and trunk so your arm swings in the following directions, as told by your health care provider: Side to side. Forward and backward. In clockwise and counterclockwise circles. Continue each motion for __________ seconds, or for as long as told by your health care provider. Slowly return to the starting position. Repeat __________ times. Complete this exercise __________ times a day. Shoulder flexion, standing  Stand and hold a broomstick, a cane, or a similar object. Place your hands a little more than shoulder width apart on the object. Your left / right hand should be palm up, and your other hand should be palm down. Keep your elbow straight and your shoulder muscles relaxed. Push the stick up with your healthy arm to raise your left / right arm in front of your body, and then over your head until you feel a stretch in your shoulder (flexion). Avoid   shrugging your shoulder while you raise your arm. Keep your shoulder blade tucked down toward the middle of your back. Hold for __________ seconds. Slowly return to the starting position. Repeat __________ times. Complete this exercise __________ times a day. Shoulder abduction, standing Stand and hold a broomstick, a cane, or a similar object. Place your hands a little more than shoulder  width apart on the object. Your left / right hand should be palm up, and your other hand should be palm down. Keep your elbow straight and your shoulder muscles relaxed. Push the object across your body toward your left / right side. Raise your left / right arm to the side of your body (abduction) until you feel a stretch in your shoulder. Do not raise your arm above shoulder height unless your health care provider tells you to do that. If directed, raise your arm over your head. Avoid shrugging your shoulder while you raise your arm. Keep your shoulder blade tucked down toward the middle of your back. Hold for __________ seconds. Slowly return to the starting position. Repeat __________ times. Complete this exercise __________ times a day. Internal rotation  Place your left / right hand behind your back, palm up. Use your other hand to dangle an exercise band, a towel, or a similar object over your shoulder. Grasp the band with your left / right hand so you are holding on to both ends. Gently pull up on the band until you feel a stretch in the front of your left / right shoulder. The movement of your arm toward the center of your body is called internal rotation. Avoid shrugging your shoulder while you raise your arm. Keep your shoulder blade tucked down toward the middle of your back. Hold for __________ seconds. Release the stretch by letting go of the band and lowering your hands. Repeat __________ times. Complete this exercise __________ times a day. Strengthening exercises External rotation  Sit in a stable chair without armrests. Secure an exercise band to a stable object at elbow height on your left / right side. Place a soft object, such as a folded towel or a small pillow, between your left / right upper arm and your body to move your elbow about 4 inches (10 cm) away from your side. Hold the end of the exercise band so it is tight and there is no slack. Keeping your elbow pressed  against the soft object, slowly move your forearm out, away from your abdomen (external rotation). Keep your body steady so only your forearm moves. Hold for __________ seconds. Slowly return to the starting position. Repeat __________ times. Complete this exercise __________ times a day. Shoulder abduction  Sit in a stable chair without armrests, or stand up. Hold a __________ weight in your left / right hand, or hold an exercise band with both hands. Start with your arms straight down and your left / right palm facing in, toward your body. Slowly lift your left / right hand out to your side (abduction). Do not lift your hand above shoulder height unless your health care provider tells you that this is safe. Keep your arms straight. Avoid shrugging your shoulder while you do this movement. Keep your shoulder blade tucked down toward the middle of your back. Hold for __________ seconds. Slowly lower your arm, and return to the starting position. Repeat __________ times. Complete this exercise __________ times a day. Shoulder extension Sit in a stable chair without armrests, or stand up. Secure an exercise band   to a stable object in front of you so it is at shoulder height. Hold one end of the exercise band in each hand. Your palms should face each other. Straighten your elbows and lift your hands up to shoulder height. Step back, away from the secured end of the exercise band, until the band is tight and there is no slack. Squeeze your shoulder blades together as you pull your hands down to the sides of your thighs (extension). Stop when your hands are straight down by your sides. Do not let your hands go behind your body. Hold for __________ seconds. Slowly return to the starting position. Repeat __________ times. Complete this exercise __________ times a day. Shoulder row Sit in a stable chair without armrests, or stand up. Secure an exercise band to a stable object in front of you so it  is at waist height. Hold one end of the exercise band in each hand. Position your palms so that your thumbs are facing the ceiling (neutral position). Bend each of your elbows to a 90-degree angle (right angle) and keep your upper arms at your sides. Step back until the band is tight and there is no slack. Slowly pull your elbows back behind you. Hold for __________ seconds. Slowly return to the starting position. Repeat __________ times. Complete this exercise __________ times a day. Shoulder press-ups  Sit in a stable chair that has armrests. Sit upright, with your feet flat on the floor. Put your hands on the armrests so your elbows are bent and your fingers are pointing forward. Your hands should be about even with the sides of your body. Push down on the armrests and use your arms to lift yourself off the chair. Straighten your elbows and lift yourself up as much as you comfortably can. Move your shoulder blades down, and avoid letting your shoulders move up toward your ears. Keep your feet on the ground. As you get stronger, your feet should support less of your body weight as you lift yourself up. Hold for __________ seconds. Slowly lower yourself back into the chair. Repeat __________ times. Complete this exercise __________ times a day. Wall push-ups  Stand so you are facing a stable wall. Your feet should be about one arm-length away from the wall. Lean forward and place your palms on the wall at shoulder height. Keep your feet flat on the floor as you bend your elbows and lean forward toward the wall. Hold for __________ seconds. Straighten your elbows to push yourself back to the starting position. Repeat __________ times. Complete this exercise __________ times a day. This information is not intended to replace advice given to you by your health care provider. Make sure you discuss any questions you have with your health care provider. Document Revised: 10/27/2021 Document  Reviewed: 12/01/2018 Elsevier Patient Education  [place].

## 2022-05-17 ENCOUNTER — Telehealth: Payer: Self-pay | Admitting: Orthopedic Surgery

## 2022-05-17 NOTE — Telephone Encounter (Signed)
Pt's wife called requesting Dr. Marlou Sa call pt back. Pt is asking if he can also have carpel tunnel surgery at the stay date as shoulder surgery. Please call pt at [phone]

## 2022-05-17 NOTE — Telephone Encounter (Signed)
Yes pls add right carpal tunnel release to procedure thx [phone]

## 2022-05-31 ENCOUNTER — Other Ambulatory Visit: Payer: Self-pay | Admitting: Surgical

## 2022-05-31 DIAGNOSIS — M7521 Bicipital tendinitis, right shoulder: Secondary | ICD-10-CM | POA: Diagnosis not present

## 2022-05-31 DIAGNOSIS — M75121 Complete rotator cuff tear or rupture of right shoulder, not specified as traumatic: Secondary | ICD-10-CM | POA: Diagnosis not present

## 2022-05-31 DIAGNOSIS — M24111 Other articular cartilage disorders, right shoulder: Secondary | ICD-10-CM | POA: Diagnosis not present

## 2022-05-31 DIAGNOSIS — G8918 Other acute postprocedural pain: Secondary | ICD-10-CM | POA: Diagnosis not present

## 2022-05-31 DIAGNOSIS — G5601 Carpal tunnel syndrome, right upper limb: Secondary | ICD-10-CM | POA: Diagnosis not present

## 2022-05-31 MED ORDER — METHOCARBAMOL 500 MG PO TABS: 500.0000 mg | ORAL_TABLET | Freq: Three times a day (TID) | ORAL | 1 refills | Status: DC | PRN

## 2022-05-31 MED ORDER — GABAPENTIN 300 MG PO CAPS: 300.0000 mg | ORAL_CAPSULE | Freq: Three times a day (TID) | ORAL | 0 refills | Status: DC

## 2022-05-31 MED ORDER — CELECOXIB 100 MG PO CAPS: 100.0000 mg | ORAL_CAPSULE | Freq: Two times a day (BID) | ORAL | 0 refills | Status: DC

## 2022-05-31 MED ORDER — OXYCODONE-ACETAMINOPHEN 5-325 MG PO TABS: 1.0000 | ORAL_TABLET | ORAL | 0 refills | Status: DC | PRN

## 2022-06-02 ENCOUNTER — Telehealth: Payer: Self-pay | Admitting: Orthopedic Surgery

## 2022-06-02 NOTE — Telephone Encounter (Signed)
Dr Marlou Sa called and talked with Ryan Fuller about getting cost  Ryan Fuller stated he will call patient tonight.

## 2022-06-02 NOTE — Telephone Encounter (Signed)
Pt wife calling to see if we have heard anything about helping them with getting a CPM machine.   CB [phone]

## 2022-06-09 ENCOUNTER — Ambulatory Visit (INDEPENDENT_AMBULATORY_CARE_PROVIDER_SITE_OTHER): Payer: Medicare HMO | Admitting: Orthopedic Surgery

## 2022-06-09 DIAGNOSIS — M75121 Complete rotator cuff tear or rupture of right shoulder, not specified as traumatic: Secondary | ICD-10-CM

## 2022-06-12 ENCOUNTER — Encounter: Payer: Self-pay | Admitting: Orthopedic Surgery

## 2022-06-12 NOTE — Progress Notes (Signed)
Post-Op Visit Note   Patient: Ryan Fuller           Date of Birth: 14-Nov-1966           MRN: [id number] Visit Date: 06/09/2022 PCP: [name], MD   Assessment & Plan:  Chief Complaint:  Chief Complaint  Patient presents with   Right Shoulder - Routine Post Op    05/31/22 right shoulder scope; deb; BT; MORCTR   Visit Diagnoses:  1. Complete tear of right rotator cuff, unspecified whether traumatic     Plan: [name] is a 56 year old patient who underwent massive rotator cuff tear repair and right carpal tunnel release 05/31/2022.  He is doing well with his hand.  Shoulder is giving him pain as expected.  Taking tramadol at night.  Also taking oxycodone at night but not at the same time.  He is using the CPM machine.  On examination he has pretty reasonable passive range of motion with no crepitus.  Deltoid is functional.  Incisions in the shoulder intact.  Plan at this time is to continue CPM.  2-week return and we will discontinue sling at that time.  We will also likely start him on physical therapy at that time.  Follow-Up Instructions: Return in about 2 weeks (around 06/23/2022).   Orders:  No orders of the defined types were placed in this encounter.  No orders of the defined types were placed in this encounter.   Imaging: No results found.  PMFS History: Patient Active Problem List   Diagnosis Date Noted   DDD (degenerative disc disease), cervical 02/04/2022   DDD (degenerative disc disease), lumbar 02/04/2022   Primary osteoarthritis of both hands 02/04/2022   Arthritis of both acromioclavicular joints 02/04/2022   GERD (gastroesophageal reflux disease) 09/02/2021   Sigmoid diverticulitis 10/01/2019   Family hx of colon cancer 10/10/2018   Rectal bleeding 10/10/2018   History of colonic polyps 10/10/2018   Gout 08/10/2017   Unspecified constipation 02/21/2013   Past Medical History:  Diagnosis Date   Dysphagia 08/10/2017   Gout    Gout 08/10/2017    Iritis    dx by Dr. Jorja Loa   Kidney stones     Family History  Problem Relation Age of Onset   Dementia Mother    Kidney cancer Father    Kidney failure Brother    Congestive Heart Failure Brother    Healthy Brother    Heart Problems Son    Healthy Son    Healthy Daughter     Past Surgical History:  Procedure Laterality Date   BACK SURGERY     BIOPSY  11/13/2018   Procedure: BIOPSY;  Surgeon: Rogene Houston, MD;  [facility];  Service: Endoscopy;;  terminal ileum colon   COLONOSCOPY N/A 03/01/2013   Procedure: COLONOSCOPY;  Surgeon: Rogene Houston, MD;  [facility];  Service: Endoscopy;  Laterality: N/A;  100   COLONOSCOPY N/A 11/13/2018   Procedure: COLONOSCOPY;  Surgeon: Rogene Houston, MD;  [facility]: [facility];  Service: Endoscopy;  Laterality: N/A;  1015am   EGD/ED     years ago   ESOPHAGEAL DILATION N/A 08/31/2017   Procedure: ESOPHAGEAL DILATION;  Surgeon: Rogene Houston, MD;  [facility];  Service: Endoscopy;  Laterality: N/A;   ESOPHAGOGASTRODUODENOSCOPY N/A 08/31/2017   Procedure: ESOPHAGOGASTRODUODENOSCOPY (EGD);  Surgeon: Rogene Houston, MD;  [facility];  Service: Endoscopy;  Laterality: N/A;  3:10   NECK SURGERY  x2   POLYPECTOMY  11/13/2018   Procedure: POLYPECTOMY;  Surgeon: Rogene Houston, MD;  [facility];  Service: Endoscopy;;  colon   Rotator cuff surgery Right    Social History   Occupational History   Not on file  Tobacco Use   Smoking status: Never    Passive exposure: Past   Smokeless tobacco: Never  Vaping Use   Vaping Use: Never used  Substance and Sexual Activity   Alcohol use: No   Drug use: No   Sexual activity: Not on file

## 2022-06-14 DIAGNOSIS — M5030 Other cervical disc degeneration, unspecified cervical region: Secondary | ICD-10-CM | POA: Diagnosis not present

## 2022-06-14 DIAGNOSIS — G2581 Restless legs syndrome: Secondary | ICD-10-CM | POA: Diagnosis not present

## 2022-06-14 DIAGNOSIS — G894 Chronic pain syndrome: Secondary | ICD-10-CM | POA: Diagnosis not present

## 2022-06-17 ENCOUNTER — Other Ambulatory Visit: Payer: Self-pay

## 2022-06-17 DIAGNOSIS — R202 Paresthesia of skin: Secondary | ICD-10-CM

## 2022-06-22 ENCOUNTER — Ambulatory Visit: Payer: Medicare HMO | Admitting: Neurology

## 2022-06-22 DIAGNOSIS — R202 Paresthesia of skin: Secondary | ICD-10-CM | POA: Diagnosis not present

## 2022-06-22 NOTE — Procedures (Signed)
[facility]  [address]  [place], [place] [address] Tel: [phone] Fax:  [phone] Test Date:  06/22/2022  Patient: Ryan Fuller DOB: 1966/01/19 Physician: Narda Amber, DO  Sex: Male Height: 6' " Ref Phys: Jaci Lazier  ID#: [id number]   Technician:    Patient Complaints: This is a 56 year old man referred for evaluation of bilateral feet numbness.  NCV & EMG Findings: Electrodiagnostic testing of the right lower extremity and additional studies of the left shows: Bilateral sural and superficial peroneal sensory responses are within normal limits. Bilateral peroneal and tibial motor responses are within normal limits. Bilateral tibial H reflex studies are within normal limits. There is no evidence of active or chronic motor axonal changes affecting any of the tested muscles.  Motor unit configuration and recruitment pattern is within normal limits.  Impression: This is a normal study of the lower extremities.  In particular, there is no evidence of a sensorimotor polyneuropathy or lumbosacral radiculopathy.    ___________________________ Narda Amber, DO    Nerve Conduction Studies Anti Sensory Summary Table   Stim Site NR Peak (ms) Norm Peak (ms) P-T Amp (V) Norm P-T Amp  Left Sup Peroneal Anti Sensory (Ant Lat Mall)  32C  12 cm    2.9 <4.6 8.9 >4  Right Sup Peroneal Anti Sensory (Ant Lat Mall)  32C  12 cm    3.0 <4.6 8.3 >4  Left Sural Anti Sensory (Lat Mall)  32C  Calf    3.3 <4.6 10.0 >4  Right Sural Anti Sensory (Lat Mall)  32C  Calf    3.4 <4.6 12.9 >4   Motor Summary Table   Stim Site NR Onset (ms) Norm Onset (ms) O-P Amp (mV) Norm O-P Amp Site1 Site2 Delta-0 (ms) Dist (cm) Vel (m/s) Norm Vel (m/s)  Left Peroneal Motor (Ext Dig Brev)  32C  Ankle    4.2 <6.0 2.9 >2.5 B Fib Ankle 8.5 38.0 45 >40  B Fib    12.7  2.4  Poplt B Fib 2.1 10.0 48 >40  Poplt    14.8  2.3         Right Peroneal Motor (Ext Dig Brev)  32C   Ankle    3.3 <6.0 4.5 >2.5 B Fib Ankle 8.7 37.0 43 >40  B Fib    12.0  4.5  Poplt B Fib 2.1 10.0 48 >40  Poplt    14.1  4.3         Left Tibial Motor (Abd Hall Brev)  32C  Ankle    4.0 <6.0 8.2 >4 Knee Ankle 10.0 42.0 42 >40  Knee    14.0  4.3         Right Tibial Motor (Abd Hall Brev)  32C  Ankle    3.3 <6.0 9.6 >4 Knee Ankle 10.8 46.0 43 >40  Knee    14.1  6.5          H Reflex Studies   NR H-Lat (ms) Lat Norm (ms) L-R H-Lat (ms)  Left Tibial (Gastroc)  32C     34.15 <35 0.00  Right Tibial (Gastroc)  32C     34.15 <35 0.00   EMG   Side Muscle Ins Act Fibs Psw Fasc Number Recrt Dur Dur. Amp Amp. Poly Poly. Comment  Right AntTibialis Nml Nml Nml Nml Nml Nml Nml Nml Nml Nml Nml Nml N/A  Left AntTibialis Nml Nml Nml Nml Nml Nml Nml Nml Nml Nml Nml Nml N/A  Left Gastroc Nml Nml Nml Nml Nml Nml Nml Nml Nml Nml Nml Nml N/A  Left Flex Dig Long Nml Nml Nml Nml Nml Nml Nml Nml Nml Nml Nml Nml N/A  Left RectFemoris Nml Nml Nml Nml Nml Nml Nml Nml Nml Nml Nml Nml N/A  Left GluteusMed Nml Nml Nml Nml Nml Nml Nml Nml Nml Nml Nml Nml N/A  Right Gastroc Nml Nml Nml Nml Nml Nml Nml Nml Nml Nml Nml Nml N/A  Right Flex Dig Long Nml Nml Nml Nml Nml Nml Nml Nml Nml Nml Nml Nml N/A  Right RectFemoris Nml Nml Nml Nml Nml Nml Nml Nml Nml Nml Nml Nml N/A  Right GluteusMed Nml Nml Nml Nml Nml Nml Nml Nml Nml Nml Nml Nml N/A      Waveforms:

## 2022-06-28 ENCOUNTER — Ambulatory Visit (INDEPENDENT_AMBULATORY_CARE_PROVIDER_SITE_OTHER): Payer: Medicare HMO | Admitting: Surgical

## 2022-06-28 ENCOUNTER — Encounter: Payer: Self-pay | Admitting: Orthopedic Surgery

## 2022-06-28 DIAGNOSIS — M75121 Complete rotator cuff tear or rupture of right shoulder, not specified as traumatic: Secondary | ICD-10-CM

## 2022-06-28 NOTE — Progress Notes (Signed)
Post-Op Visit Note   Patient: Ryan Fuller           Date of Birth: November 02, 1966           MRN: [id number] Visit Date: 06/28/2022 PCP: [name], MD   Assessment & Plan:  Chief Complaint:  Chief Complaint  Patient presents with   Right Shoulder - Routine Post Op    05/31/22 right shoulder scope; deb; BT; MORCTR   Visit Diagnoses:  1. Complete tear of right rotator cuff, unspecified whether traumatic     Plan: Patient is a 56 year old male who presents s/p right shoulder arthroscopy with biceps tenodesis and mini open rotator cuff tear repair as well as carpal tunnel release of the right hand on 05/31/2022.  Doing well and compliant with wearing the sling.  He is taking no pain medication.  Up to 120 degrees on CPM machine using this 3 times per day.  Also using shoulder pulley.  Able to sleep through the night in a recliner but cannot sleep in bed yet.  No fevers or chills or chest pain/shortness of breath.  On exam, has 40 degrees external rotation, 100 degrees abduction, 140 degrees forward flexion.  Incisions are healing well with no evidence of infection or dehiscence both on the shoulder and the right palm.  Intact EPL, FPL, finger abduction, finger adduction, pronation/supination, wrist extension, bicep flexion, tricep extension.  Axillary nerve intact with deltoid firing.  No Popeye deformity noted.  Plan is to start physical therapy in [place] 1 time per week for 6 weeks.  No rotator cuff strengthening exercises until 7 weeks postop due to the massive rotator cuff tear.  Also okay for active range of motion of the right shoulder at 6 weeks out from surgery.  Until then, just focus on passive range of motion.  Follow-up in 4 weeks for clinical recheck regarding his range of motion and strength.  He may discontinue the sling today but recommended he use it for the next 2 to 3 weeks when he goes out to friends houses or at church..  Follow-Up Instructions: No follow-ups on file.    Orders:  Orders Placed This Encounter  Procedures   Ambulatory referral to Occupational Therapy   No orders of the defined types were placed in this encounter.   Imaging: No results found.  PMFS History: Patient Active Problem List   Diagnosis Date Noted   DDD (degenerative disc disease), cervical 02/04/2022   DDD (degenerative disc disease), lumbar 02/04/2022   Primary osteoarthritis of both hands 02/04/2022   Arthritis of both acromioclavicular joints 02/04/2022   GERD (gastroesophageal reflux disease) 09/02/2021   Sigmoid diverticulitis 10/01/2019   Family hx of colon cancer 10/10/2018   Rectal bleeding 10/10/2018   History of colonic polyps 10/10/2018   Gout 08/10/2017   Unspecified constipation 02/21/2013   Past Medical History:  Diagnosis Date   Dysphagia 08/10/2017   Gout    Gout 08/10/2017   Iritis    dx by Dr. Jorja Loa   Kidney stones     Family History  Problem Relation Age of Onset   Dementia Mother    Kidney cancer Father    Kidney failure Brother    Congestive Heart Failure Brother    Healthy Brother    Heart Problems Son    Healthy Son    Healthy Daughter     Past Surgical History:  Procedure Laterality Date   BACK SURGERY     BIOPSY  11/13/2018  Procedure: BIOPSY;  Surgeon: Rogene Houston, MD;  [facility];  Service: Endoscopy;;  terminal ileum colon   COLONOSCOPY N/A 03/01/2013   Procedure: COLONOSCOPY;  Surgeon: Rogene Houston, MD;  [facility];  Service: Endoscopy;  Laterality: N/A;  100   COLONOSCOPY N/A 11/13/2018   Procedure: COLONOSCOPY;  Surgeon: Rogene Houston, MD;  [facility];  Service: Endoscopy;  Laterality: N/A;  1015am   EGD/ED     years ago   ESOPHAGEAL DILATION N/A 08/31/2017   Procedure: ESOPHAGEAL DILATION;  Surgeon: Rogene Houston, MD;  [facility];  Service: Endoscopy;  Laterality: N/A;   ESOPHAGOGASTRODUODENOSCOPY N/A 08/31/2017   Procedure:  ESOPHAGOGASTRODUODENOSCOPY (EGD);  Surgeon: Rogene Houston, MD;  [facility];  Service: Endoscopy;  Laterality: N/A;  3:10   NECK SURGERY     x2   POLYPECTOMY  11/13/2018   Procedure: POLYPECTOMY;  Surgeon: Rogene Houston, MD;  [facility];  Service: Endoscopy;;  colon   Rotator cuff surgery Right    Social History   Occupational History   Not on file  Tobacco Use   Smoking status: Never    Passive exposure: Past   Smokeless tobacco: Never  Vaping Use   Vaping Use: Never used  Substance and Sexual Activity   Alcohol use: No   Drug use: No   Sexual activity: Not on file

## 2022-07-02 ENCOUNTER — Telehealth: Payer: Self-pay | Admitting: Rheumatology

## 2022-07-02 NOTE — Telephone Encounter (Signed)
Returned patient's call and notified that the nerve conduction velocities were read normal by Dr. Posey Pronto.

## 2022-07-02 NOTE — Telephone Encounter (Unsigned)
Patient called requesting a return call to discuss the results from his nerve conduction test.  Patient states the test was performed by Dr. Posey Pronto who told him Dr. Estanislado Pandy would go over the results with him.

## 2022-07-08 ENCOUNTER — Ambulatory Visit (HOSPITAL_COMMUNITY): Payer: Medicare HMO | Attending: Orthopedic Surgery

## 2022-07-08 ENCOUNTER — Encounter (HOSPITAL_COMMUNITY): Payer: Self-pay

## 2022-07-08 DIAGNOSIS — M25611 Stiffness of right shoulder, not elsewhere classified: Secondary | ICD-10-CM | POA: Insufficient documentation

## 2022-07-08 DIAGNOSIS — M25511 Pain in right shoulder: Secondary | ICD-10-CM | POA: Diagnosis not present

## 2022-07-08 DIAGNOSIS — R29898 Other symptoms and signs involving the musculoskeletal system: Secondary | ICD-10-CM | POA: Insufficient documentation

## 2022-07-08 NOTE — Patient Instructions (Addendum)
Perform each exercise ____10-15____ reps. 2-3x days.   1) Protraction   Start by holding a wand or cane at chest height.  Next, slowly push the wand outwards in front of your body so that your elbows become fully straightened. Then, return to the original position.     2) Shoulder FLEXION   In the standing position, hold a wand/cane with both arms, palms down on both sides. Raise up the wand/cane allowing your unaffected arm to perform most of the effort. Your affected arm should be partially relaxed.      3) Internal/External ROTATION   In the standing position, hold a wand/cane with both hands keeping your elbows bent. Move your arms and wand/cane to one side.  Your affected arm should be partially relaxed while your unaffected arm performs most of the effort.       4) Shoulder ABDUCTION   While holding a wand/cane palm face up on the injured side and palm face down on the uninjured side, slowly raise up your injured arm to the side.        5) Horizontal Abduction/Adduction      Straight arms holding cane at shoulder height, bring cane to right, center, left. Repeat starting to left.   Copyright  [employer]. All rights reserved.      1) SHOULDER: Flexion On Table   Place hands on towel placed on table, elbows straight. Lean forward with you upper body, pushing towel away from body.  ___ reps per set, ___ sets per day

## 2022-07-08 NOTE — Therapy (Signed)
OUTPATIENT OCCUPATIONAL THERAPY ORTHO EVALUATION  Patient Name: Ryan Fuller MRN: [id number] DOB:1965-12-15, 56 y.o., male 1 Date: 07/08/2022  PCP: [name], MD REFERRING PROVIDER: [name], MD   OT End of Session - 07/08/22 1107     Visit Number 1    Number of Visits 12    Date for OT Re-Evaluation 08/20/22    Authorization Type [employer]    Authorization Time Period requesting 12 visitis 8/24-10/6/23    OT Start Time 0945    OT Stop Time 1020    OT Time Calculation (min) 35 min    Activity Tolerance Patient tolerated treatment well    Behavior During Therapy [facility] for tasks assessed/performed             Past Medical History:  Diagnosis Date   Dysphagia 08/10/2017   Gout    Gout 08/10/2017   Iritis    dx by Dr. Jorja Loa   Kidney stones    Past Surgical History:  Procedure Laterality Date   BACK SURGERY     BIOPSY  11/13/2018   Procedure: BIOPSY;  Surgeon: Rogene Houston, MD;  [facility];  Service: Endoscopy;;  terminal ileum colon   COLONOSCOPY N/A 03/01/2013   Procedure: COLONOSCOPY;  Surgeon: Rogene Houston, MD;  [facility];  Service: Endoscopy;  Laterality: N/A;  100   COLONOSCOPY N/A 11/13/2018   Procedure: COLONOSCOPY;  Surgeon: Rogene Houston, MD;  [facility];  Service: Endoscopy;  Laterality: N/A;  1015am   EGD/ED     years ago   ESOPHAGEAL DILATION N/A 08/31/2017   Procedure: ESOPHAGEAL DILATION;  Surgeon: Rogene Houston, MD;  [facility];  Service: Endoscopy;  Laterality: N/A;   ESOPHAGOGASTRODUODENOSCOPY N/A 08/31/2017   Procedure: ESOPHAGOGASTRODUODENOSCOPY (EGD);  Surgeon: Rogene Houston, MD;  [facility];  Service: Endoscopy;  Laterality: N/A;  3:10   NECK SURGERY     x2   POLYPECTOMY  11/13/2018   Procedure: POLYPECTOMY;  Surgeon: Rogene Houston, MD;  [facility];  Service: Endoscopy;;  colon   Rotator cuff surgery Right     Patient Active Problem List   Diagnosis Date Noted   DDD (degenerative disc disease), cervical 02/04/2022   DDD (degenerative disc disease), lumbar 02/04/2022   Primary osteoarthritis of both hands 02/04/2022   Arthritis of both acromioclavicular joints 02/04/2022   GERD (gastroesophageal reflux disease) 09/02/2021   Sigmoid diverticulitis 10/01/2019   Family hx of colon cancer 10/10/2018   Rectal bleeding 10/10/2018   History of colonic polyps 10/10/2018   Gout 08/10/2017   Unspecified constipation 02/21/2013    ONSET DATE: 7/17  REFERRING DIAG: M75.121 (ICD-10-CM) - Complete tear of right rotator cuff, unspecified whether traumatic  THERAPY DIAG:  Acute pain of right shoulder  Stiffness of right shoulder, not elsewhere classified  Other symptoms and signs involving the musculoskeletal system  Rationale for Evaluation and Treatment Rehabilitation  SUBJECTIVE:   SUBJECTIVE STATEMENT: S: "I might lift a coffee pot or something, nothing too heavy, moving it around a little.I have been using pulleys and the passive machine." Pt accompanied by: self  PERTINENT HISTORY: Pt presents s/p right shoulder arthoplasty w/ biceps tenodesis and mini open rotator cuff repair and carpal tunnel release on 7/17. He was referred to OT by Dr. Marcene Duos.   PRECAUTIONS: Shoulder; No rotator cuff strengthening until 7 weeks. A/ROM at 6 weeks  WEIGHT BEARING RESTRICTIONS Yes  PAIN:  Are you having pain? Yes: NPRS scale: 3/10 Pain location: Anterior shoulder, clavicle  Pain description: constant ache Aggravating factors: Cold, exercises  Relieving factors: Heat   FALLS: Has patient fallen in last 6 months? No  LIVING ENVIRONMENT: Lives with: lives with their spouse Lives in: House/apartment  PLOF: Independent  PATIENT GOALS "Get back to where I can shoot my gun"  OBJECTIVE:   HAND DOMINANCE: Right  ADLs: Overall ADLs: Pt reports being independent with all ADLs with  occasional assistance from his spouse.  Pt farms, trains bird dogs, and works in his shop. He is limited by lifting precautions and pain.  FUNCTIONAL OUTCOME MEASURES: FOTO: 48.65  UPPER EXTREMITY ROM     Passive ROM Right eval  Shoulder flexion 143  Shoulder abduction 120  Shoulder internal rotation 90  Shoulder external rotation 39  (Blank rows = not tested)   Active ROM Right eval  Shoulder flexion   Shoulder abduction   Shoulder external rotation   Shoulder internal rotation   (Blank rows = not tested)  UPPER EXTREMITY MMT:     MMT Right eval  Shoulder flexion   Shoulder abduction   Shoulder internal rotation   Shoulder external rotation   (Blank rows = not tested)    COGNITION: Overall cognitive status: Within functional limits for tasks assessed   TODAY'S TREATMENT:  Evaluation only    PATIENT EDUCATION: Education details: AA/ROM Person educated: Patient Education method: Consulting civil engineer, Media planner, and Handouts Education comprehension: verbalized understanding and returned demonstration   HOME EXERCISE PROGRAM: Eval: AA/ROM  GOALS: Goals reviewed with patient? Yes  SHORT TERM GOALS: Target date:  08/20/22     Pt will be provided with and educated on HEP to improve mobility in RUE required for ADL completion.   Goal status: INITIAL  2.  Pt will decrease pain in RUE to 1/10 or less in order to complete hygiene/grooming tasks without increased pain.   Goal status: INITIAL  3.  Pt will decrease RUE fascial restrictions to minimal amounts or less to improve mobility required for overhead reaching tasks in his shop.    Goal status: INITIAL  4.  Pt will increase P/ROM in RUE to [facility] to improve ability to perform dressing tasks with minimal compensatory techniques.   Goal status: INITIAL  5.  Pt will increase A/ROM of RUE to [facility] to improve ability to reach overhead and behind back during dressing and bathing tasks.   Goal status:  INITIAL  6.  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   Goal status: INITIAL   ASSESSMENT:  CLINICAL IMPRESSION: Patient is a 56 y.o. male who was seen today for occupational therapy evaluation s/p right shoulder arthroplasty. He presents today without a sling and with decreased RUE ROM, decreased RUE strength, increased fascial restrictions and some increased pain. Pt reports that he has been using pulleys at home as well as the passive motion machine.   PERFORMANCE DEFICITS in functional skills including ADLs, IADLs, ROM, strength, pain, fascial restrictions, muscle spasms, endurance, and UE functional use.  IMPAIRMENTS are limiting patient from ADLs, IADLs, rest and sleep, work, and leisure.   COMORBIDITIES has no other co-morbidities that affects occupational performance. Patient will benefit from skilled OT to address above impairments and improve overall function.  MODIFICATION OR ASSISTANCE TO COMPLETE EVALUATION: No modification of tasks or assist necessary to complete an evaluation.  OT OCCUPATIONAL PROFILE AND HISTORY:  Problem focused assessment: Including review of records relating to presenting problem.  CLINICAL DECISION MAKING: LOW - limited treatment options, no task modification necessary  REHAB POTENTIAL: Excellent  EVALUATION COMPLEXITY: Low      PLAN: OT FREQUENCY: 2x/week  OT DURATION: 6 weeks  PLANNED INTERVENTIONS: self care/ADL training, therapeutic exercise, therapeutic activity, manual therapy, passive range of motion, moist heat, cryotherapy, patient/family education, energy conservation, and DME and/or AE instructions  CONSULTED AND AGREED WITH PLAN OF CARE: Patient  PLAN FOR NEXT SESSION: Manual, P/ROM, scapular ROM   Flonnie Hailstone, OTD, OTR/L [phone]  07/08/2022, 11:11 AM

## 2022-07-12 ENCOUNTER — Ambulatory Visit (HOSPITAL_COMMUNITY): Payer: Medicare HMO

## 2022-07-14 ENCOUNTER — Encounter (HOSPITAL_COMMUNITY): Payer: Medicare HMO | Admitting: Occupational Therapy

## 2022-07-14 DIAGNOSIS — G894 Chronic pain syndrome: Secondary | ICD-10-CM | POA: Diagnosis not present

## 2022-07-14 DIAGNOSIS — M5136 Other intervertebral disc degeneration, lumbar region: Secondary | ICD-10-CM | POA: Diagnosis not present

## 2022-07-14 DIAGNOSIS — Z6830 Body mass index (BMI) 30.0-30.9, adult: Secondary | ICD-10-CM | POA: Diagnosis not present

## 2022-07-14 DIAGNOSIS — E6609 Other obesity due to excess calories: Secondary | ICD-10-CM | POA: Diagnosis not present

## 2022-07-14 DIAGNOSIS — T50905A Adverse effect of unspecified drugs, medicaments and biological substances, initial encounter: Secondary | ICD-10-CM | POA: Diagnosis not present

## 2022-07-14 DIAGNOSIS — G4730 Sleep apnea, unspecified: Secondary | ICD-10-CM | POA: Diagnosis not present

## 2022-07-14 DIAGNOSIS — G2581 Restless legs syndrome: Secondary | ICD-10-CM | POA: Diagnosis not present

## 2022-07-20 ENCOUNTER — Encounter (HOSPITAL_COMMUNITY): Payer: Medicare HMO | Admitting: Occupational Therapy

## 2022-07-22 ENCOUNTER — Encounter (HOSPITAL_COMMUNITY): Payer: Medicare HMO | Admitting: Occupational Therapy

## 2022-07-29 ENCOUNTER — Encounter: Payer: Medicare HMO | Admitting: Neurology

## 2022-08-02 ENCOUNTER — Ambulatory Visit (INDEPENDENT_AMBULATORY_CARE_PROVIDER_SITE_OTHER): Payer: Medicare HMO | Admitting: Orthopedic Surgery

## 2022-08-02 DIAGNOSIS — M75121 Complete rotator cuff tear or rupture of right shoulder, not specified as traumatic: Secondary | ICD-10-CM

## 2022-08-03 ENCOUNTER — Encounter (HOSPITAL_COMMUNITY): Payer: Medicare HMO | Admitting: Occupational Therapy

## 2022-08-05 ENCOUNTER — Encounter (HOSPITAL_COMMUNITY): Payer: Medicare HMO | Admitting: Occupational Therapy

## 2022-08-05 ENCOUNTER — Encounter: Payer: Self-pay | Admitting: Orthopedic Surgery

## 2022-08-05 NOTE — Progress Notes (Signed)
Post-Op Visit Note   Patient: Ryan Fuller           Date of Birth: 05/08/1966           MRN: [id number] Visit Date: 08/02/2022 PCP: [name], MD   Assessment & Plan:  Chief Complaint:  Chief Complaint  Patient presents with   Right Shoulder - Follow-up       05/31/22 right shoulder scope; deb; BT; MORCTR     Visit Diagnoses: No diagnosis found.  Plan: Ryan Fuller is a 56 year old patient who underwent right shoulder arthroscopy massive rotator cuff tear repair and biceps tenodesis on 05/31/2022.  He states that a couple of weeks ago he was lifting some 30 pound puppies that had gone out of the yard and put them back in the fence.  They were about 30 pounds each.  He noticed that this caused decreased range of motion and swelling.  He has been using ice and has noticed some improvement.  He was doing physical therapy but now is doing home exercise program.  He also describes some left hand pain.  Has known history of carpal tunnel syndrome on that side.  Using a brace.  Takes Ultram for his back pain.  On examination his range of motion is reasonable.  He does have a little bit of coarseness in the right shoulder with passive range of motion which was not present on his prior exam.  Strength is slightly diminished in external rotation at 5- out of 5 on the right compared to 5+ out of 5 on the left.  No Popeye deformity present.  Impression is possible reinjury of the rotator cuff doing some puffy lifting in a semiurgent situation when they were trying to escape from the yard.  He has improved some since he had to repetitively lift these 5 puppies over the fence.  They did weigh 30 pounds.  I think if Ryan Fuller maintains his anterior posterior force couple and he could potentially withstand some recurrent tearing of the supraspinatus which may be what has happened here.  Plan for 4-week clinical recheck on the right shoulder as well as the left wrist carpal tunnel syndrome.  Follow-Up  Instructions: No follow-ups on file.   Orders:  No orders of the defined types were placed in this encounter.  No orders of the defined types were placed in this encounter.   Imaging: No results found.  PMFS History: Patient Active Problem List   Diagnosis Date Noted   DDD (degenerative disc disease), cervical 02/04/2022   DDD (degenerative disc disease), lumbar 02/04/2022   Primary osteoarthritis of both hands 02/04/2022   Arthritis of both acromioclavicular joints 02/04/2022   GERD (gastroesophageal reflux disease) 09/02/2021   Sigmoid diverticulitis 10/01/2019   Family hx of colon cancer 10/10/2018   Rectal bleeding 10/10/2018   History of colonic polyps 10/10/2018   Gout 08/10/2017   Unspecified constipation 02/21/2013   Past Medical History:  Diagnosis Date   Dysphagia 08/10/2017   Gout    Gout 08/10/2017   Iritis    dx by Dr. Jorja Loa   Kidney stones     Family History  Problem Relation Age of Onset   Dementia Mother    Kidney cancer Father    Kidney failure Brother    Congestive Heart Failure Brother    Healthy Brother    Heart Problems Son    Healthy Son    Healthy Daughter     Past Surgical History:  Procedure Laterality Date  BACK SURGERY     BIOPSY  11/13/2018   Procedure: BIOPSY;  Surgeon: Rogene Houston, MD;  [facility];  Service: Endoscopy;;  terminal ileum colon   COLONOSCOPY N/A 03/01/2013   Procedure: COLONOSCOPY;  Surgeon: Rogene Houston, MD;  [facility];  Service: Endoscopy;  Laterality: N/A;  100   COLONOSCOPY N/A 11/13/2018   Procedure: COLONOSCOPY;  Surgeon: Rogene Houston, MD;  [facility];  Service: Endoscopy;  Laterality: N/A;  1015am   EGD/ED     years ago   ESOPHAGEAL DILATION N/A 08/31/2017   Procedure: ESOPHAGEAL DILATION;  Surgeon: Rogene Houston, MD;  [facility];  Service: Endoscopy;  Laterality: N/A;   ESOPHAGOGASTRODUODENOSCOPY N/A 08/31/2017   Procedure:  ESOPHAGOGASTRODUODENOSCOPY (EGD);  Surgeon: Rogene Houston, MD;  [facility];  Service: Endoscopy;  Laterality: N/A;  3:10   NECK SURGERY     x2   POLYPECTOMY  11/13/2018   Procedure: POLYPECTOMY;  Surgeon: Rogene Houston, MD;  [facility];  Service: Endoscopy;;  colon   Rotator cuff surgery Right    Social History   Occupational History   Not on file  Tobacco Use   Smoking status: Never    Passive exposure: Past   Smokeless tobacco: Never  Vaping Use   Vaping Use: Never used  Substance and Sexual Activity   Alcohol use: No   Drug use: No   Sexual activity: Not on file

## 2022-08-09 ENCOUNTER — Encounter (HOSPITAL_COMMUNITY): Payer: Medicare HMO | Admitting: Occupational Therapy

## 2022-08-11 ENCOUNTER — Encounter (HOSPITAL_COMMUNITY): Payer: Medicare HMO | Admitting: Occupational Therapy

## 2022-08-17 ENCOUNTER — Encounter (HOSPITAL_COMMUNITY): Payer: Medicare HMO | Admitting: Occupational Therapy

## 2022-08-19 ENCOUNTER — Other Ambulatory Visit (INDEPENDENT_AMBULATORY_CARE_PROVIDER_SITE_OTHER): Payer: Self-pay | Admitting: Gastroenterology

## 2022-08-19 ENCOUNTER — Encounter (HOSPITAL_COMMUNITY): Payer: Medicare HMO | Admitting: Occupational Therapy

## 2022-08-19 DIAGNOSIS — K219 Gastro-esophageal reflux disease without esophagitis: Secondary | ICD-10-CM

## 2022-08-30 ENCOUNTER — Ambulatory Visit (INDEPENDENT_AMBULATORY_CARE_PROVIDER_SITE_OTHER): Payer: Medicare HMO | Admitting: Orthopedic Surgery

## 2022-08-30 ENCOUNTER — Encounter: Payer: Self-pay | Admitting: Orthopedic Surgery

## 2022-08-30 DIAGNOSIS — G5602 Carpal tunnel syndrome, left upper limb: Secondary | ICD-10-CM

## 2022-08-30 NOTE — Progress Notes (Signed)
Post-Op Visit Note   Patient: Ryan Fuller           Date of Birth: [birth year]           MRN: [id number] Visit Date: 08/30/2022 PCP: [name], MD   Assessment & Plan:  Chief Complaint:  Chief Complaint  Patient presents with   Right Shoulder - Pain   Visit Diagnoses:  1. Carpal tunnel syndrome, left upper limb     Plan: Ryan Fuller is a 56 year old patient who underwent right shoulder rotator cuff tear repair several months ago.  He was picking up some puppies and may have injured his right shoulder rotator cuff tear.  He is doing well from right carpal tunnel release.  Has known history of left carpal tunnel as well which is becoming much more symptomatic on a daily basis.  Reports numbness and tingling in the fingers all the time which has not been helped with a wrist splint.  Describes some loss of dexterity.  EMG nerve study does show both right and left-sided carpal tunnel syndrome.  He has had the right when released and is doing well with that.  On examination he has good external rotation internal rotation strength on the right but still some weakness with supraspinatus testing.  Left hand demonstrates positive carpal tunnel compression testing with negative Tinel's cubital tunnel on the left-hand side.  Minimal abductor pollicis brevis wasting is present.  EPL FPL interosseous strength intact.  Impression is left carpal tunnel syndrome.  Plan is left carpal tunnel release.  Risk benefits are discussed with the patient we will limited to infection or vessel damage incomplete pain relief as well as prolonged rehabilitation required.  He is familiar with the rehabilitation based on his right hand release.  All questions answered.  No indication for further work-up of the shoulder at this time as his current right shoulder function is something he can live with.  I think it is likely that he damaged the repair by lifting this puppies a month ago but currently he has improved and has  enough functional strength to get by for now.  Follow-Up Instructions: No follow-ups on file.   Orders:  No orders of the defined types were placed in this encounter.  No orders of the defined types were placed in this encounter.   Imaging: No results found.  PMFS History: Patient Active Problem List   Diagnosis Date Noted   DDD (degenerative disc disease), cervical 02/04/2022   DDD (degenerative disc disease), lumbar 02/04/2022   Primary osteoarthritis of both hands 02/04/2022   Arthritis of both acromioclavicular joints 02/04/2022   GERD (gastroesophageal reflux disease) 09/02/2021   Sigmoid diverticulitis 10/01/2019   Family hx of colon cancer 10/10/2018   Rectal bleeding 10/10/2018   History of colonic polyps 10/10/2018   Gout 08/10/2017   Unspecified constipation 02/21/2013   Past Medical History:  Diagnosis Date   Dysphagia 08/10/2017   Gout    Gout 08/10/2017   Iritis    dx by Dr. Jorja Loa   Kidney stones     Family History  Problem Relation Age of Onset   Dementia Mother    Kidney cancer Father    Kidney failure Brother    Congestive Heart Failure Brother    Healthy Brother    Heart Problems Son    Healthy Son    Healthy Daughter     Past Surgical History:  Procedure Laterality Date   BACK SURGERY     BIOPSY  11/13/2018   Procedure: BIOPSY;  Surgeon: Rogene Houston, MD;  [facility];  Service: Endoscopy;;  terminal ileum colon   COLONOSCOPY N/A 03/01/2013   Procedure: COLONOSCOPY;  Surgeon: Rogene Houston, MD;  [facility];  Service: Endoscopy;  Laterality: N/A;  100   COLONOSCOPY N/A 11/13/2018   Procedure: COLONOSCOPY;  Surgeon: Rogene Houston, MD;  [facility];  Service: Endoscopy;  Laterality: N/A;  1015am   EGD/ED     years ago   ESOPHAGEAL DILATION N/A 08/31/2017   Procedure: ESOPHAGEAL DILATION;  Surgeon: Rogene Houston, MD;  [facility];  Service: Endoscopy;  Laterality: N/A;    ESOPHAGOGASTRODUODENOSCOPY N/A 08/31/2017   Procedure: ESOPHAGOGASTRODUODENOSCOPY (EGD);  Surgeon: Rogene Houston, MD;  [facility];  Service: Endoscopy;  Laterality: N/A;  3:10   NECK SURGERY     x2   POLYPECTOMY  11/13/2018   Procedure: POLYPECTOMY;  Surgeon: Rogene Houston, MD;  [facility];  Service: Endoscopy;;  colon   Rotator cuff surgery Right    Social History   Occupational History   Not on file  Tobacco Use   Smoking status: Never    Passive exposure: Past   Smokeless tobacco: Never  Vaping Use   Vaping Use: Never used  Substance and Sexual Activity   Alcohol use: No   Drug use: No   Sexual activity: Not on file

## 2022-09-03 ENCOUNTER — Other Ambulatory Visit: Payer: Self-pay | Admitting: Orthopedic Surgery

## 2022-09-03 DIAGNOSIS — G5602 Carpal tunnel syndrome, left upper limb: Secondary | ICD-10-CM | POA: Diagnosis not present

## 2022-09-03 MED ORDER — TRAMADOL HCL 50 MG PO TABS: 50.0000 mg | ORAL_TABLET | Freq: Four times a day (QID) | ORAL | 0 refills | Status: DC | PRN

## 2022-09-06 ENCOUNTER — Telehealth: Payer: Self-pay

## 2022-09-06 NOTE — Telephone Encounter (Signed)
Patient called wanting to know if it is normal to have numbness and tingling in his left thumb, index finger and middle finger.  Patient had left hand surgery on Friday, 09/03/2022.  Cb# [phone].  Please advise.  Thank you

## 2022-09-06 NOTE — Telephone Encounter (Signed)
I called.  Having some numbness in the thumb index and long finger but the thumb moves well.  Plan for observation for now.

## 2022-09-17 ENCOUNTER — Ambulatory Visit (INDEPENDENT_AMBULATORY_CARE_PROVIDER_SITE_OTHER): Payer: Medicare HMO | Admitting: Orthopedic Surgery

## 2022-09-17 ENCOUNTER — Encounter: Payer: Self-pay | Admitting: Orthopedic Surgery

## 2022-09-17 DIAGNOSIS — G5602 Carpal tunnel syndrome, left upper limb: Secondary | ICD-10-CM

## 2022-09-17 NOTE — Progress Notes (Signed)
Post-Op Visit Note   Patient: Ryan Fuller           Date of Birth: Apr 02, 1966           MRN: [id number] Visit Date: 09/17/2022 PCP: [name], MD   Assessment & Plan:  Chief Complaint:  Chief Complaint  Patient presents with   Left Hand - Routine Post Op   Visit Diagnoses:  1. Carpal tunnel syndrome, left upper limb     Plan: Patient is a 56 year old patient is now 2 weeks out left carpal tunnel release.  He also underwent right shoulder surgery several months ago.  He was picking up some dogs and had some shoulder pain.  I think it is possible he may have reinjured his right rotator cuff.  Having little bit of sharp shooting pain and anterior swelling when he was lifting the arm up 2 days ago.  On examination of the right shoulder is biceps contour is normal.  Deltoid is functional.  Has pretty reasonable external rotation strength as well as subscap strength.  A little bit of coarseness present which may be consistent with partial retear of the rotator cuff tear repair.  It was a massive tear.  He wants to hold off on any further work-up on that for now.  Regarding his left hand he still having some numbness in the thumb and index finger.  His motor branch is functional and FDP of the index and long finger are also functional.  Plan at this time is to discontinue the sutures and have him work on range of motion exercises in that hand and we will see him back in 4 weeks to see how the numbness is improving.  Follow-Up Instructions: No follow-ups on file.   Orders:  No orders of the defined types were placed in this encounter.  No orders of the defined types were placed in this encounter.   Imaging: No results found.  PMFS History: Patient Active Problem List   Diagnosis Date Noted   DDD (degenerative disc disease), cervical 02/04/2022   DDD (degenerative disc disease), lumbar 02/04/2022   Primary osteoarthritis of both hands 02/04/2022   Arthritis of both  acromioclavicular joints 02/04/2022   GERD (gastroesophageal reflux disease) 09/02/2021   Sigmoid diverticulitis 10/01/2019   Family hx of colon cancer 10/10/2018   Rectal bleeding 10/10/2018   History of colonic polyps 10/10/2018   Gout 08/10/2017   Unspecified constipation 02/21/2013   Past Medical History:  Diagnosis Date   Dysphagia 08/10/2017   Gout    Gout 08/10/2017   Iritis    dx by Dr. Jorja Loa   Kidney stones     Family History  Problem Relation Age of Onset   Dementia Mother    Kidney cancer Father    Kidney failure Brother    Congestive Heart Failure Brother    Healthy Brother    Heart Problems Son    Healthy Son    Healthy Daughter     Past Surgical History:  Procedure Laterality Date   BACK SURGERY     BIOPSY  11/13/2018   Procedure: BIOPSY;  Surgeon: Rogene Houston, MD;  [facility];  Service: Endoscopy;;  terminal ileum colon   COLONOSCOPY N/A 03/01/2013   Procedure: COLONOSCOPY;  Surgeon: Rogene Houston, MD;  [facility];  Service: Endoscopy;  Laterality: N/A;  100   COLONOSCOPY N/A 11/13/2018   Procedure: COLONOSCOPY;  Surgeon: Rogene Houston, MD;  [facility];  Service: Endoscopy;  Laterality: N/A;  1015am   EGD/ED     years ago   ESOPHAGEAL DILATION N/A 08/31/2017   Procedure: ESOPHAGEAL DILATION;  Surgeon: Rogene Houston, MD;  [facility];  Service: Endoscopy;  Laterality: N/A;   ESOPHAGOGASTRODUODENOSCOPY N/A 08/31/2017   Procedure: ESOPHAGOGASTRODUODENOSCOPY (EGD);  Surgeon: Rogene Houston, MD;  [facility];  Service: Endoscopy;  Laterality: N/A;  3:10   NECK SURGERY     x2   POLYPECTOMY  11/13/2018   Procedure: POLYPECTOMY;  Surgeon: Rogene Houston, MD;  [facility];  Service: Endoscopy;;  colon   Rotator cuff surgery Right    Social History   Occupational History   Not on file  Tobacco Use   Smoking status: Never    Passive exposure: Past   Smokeless  tobacco: Never  Vaping Use   Vaping Use: Never used  Substance and Sexual Activity   Alcohol use: No   Drug use: No   Sexual activity: Not on file

## 2022-10-14 DIAGNOSIS — Z6832 Body mass index (BMI) 32.0-32.9, adult: Secondary | ICD-10-CM | POA: Diagnosis not present

## 2022-10-14 DIAGNOSIS — M5136 Other intervertebral disc degeneration, lumbar region: Secondary | ICD-10-CM | POA: Diagnosis not present

## 2022-10-14 DIAGNOSIS — E6609 Other obesity due to excess calories: Secondary | ICD-10-CM | POA: Diagnosis not present

## 2022-10-14 DIAGNOSIS — G2581 Restless legs syndrome: Secondary | ICD-10-CM | POA: Diagnosis not present

## 2022-10-14 DIAGNOSIS — M5030 Other cervical disc degeneration, unspecified cervical region: Secondary | ICD-10-CM | POA: Diagnosis not present

## 2022-10-14 DIAGNOSIS — M159 Polyosteoarthritis, unspecified: Secondary | ICD-10-CM | POA: Diagnosis not present

## 2022-10-14 DIAGNOSIS — G894 Chronic pain syndrome: Secondary | ICD-10-CM | POA: Diagnosis not present

## 2022-10-14 DIAGNOSIS — J0100 Acute maxillary sinusitis, unspecified: Secondary | ICD-10-CM | POA: Diagnosis not present

## 2022-10-15 ENCOUNTER — Encounter: Payer: Self-pay | Admitting: Orthopedic Surgery

## 2022-10-15 ENCOUNTER — Ambulatory Visit (INDEPENDENT_AMBULATORY_CARE_PROVIDER_SITE_OTHER): Payer: Medicare HMO | Admitting: Surgical

## 2022-10-15 DIAGNOSIS — M25512 Pain in left shoulder: Secondary | ICD-10-CM | POA: Diagnosis not present

## 2022-10-15 DIAGNOSIS — M542 Cervicalgia: Secondary | ICD-10-CM | POA: Diagnosis not present

## 2022-10-15 NOTE — Progress Notes (Cosign Needed Addendum)
Post-Op Visit Note   Patient: Ryan Fuller           Date of Birth: November 04, 1966           MRN: [id number] Visit Date: 10/15/2022 PCP: [name], MD   Assessment & Plan:  Chief Complaint:  Chief Complaint  Patient presents with   Right Hand - Routine Post Op   Visit Diagnoses:  1. Cervicalgia   2. Left shoulder pain, unspecified chronicity     Plan: Patient is a 56 year old male who presents s/p left carpal tunnel release on 09/03/2022.  He states that he still has numbness in all of his fingers that is actually worse than it was just immediately after surgery.  He states at times it is very difficult to bend his wrist.  He denies any fevers or chills or drainage from the incision.  Does not feel numbness all the time to the same extent but states that symptoms worsen throughout the day.  Mostly involving the thumb, index, long, ring fingers with some involvement of the small finger to a lesser extent.  He also has radicular pain that is traveling down the left arm from the scapular region and travels into the fingertips.  He notes some increased neck pain recently.  Also complains of weakness in the left arm.  He was hunting the other day and found it very difficult to lift his right follow-up to the point where he "just could not do it".  This weakness is more coming from his left shoulder compared with his right arm.  His right arm is actually doing pretty well following rotator cuff repair and he does not have any more mechanical or clicking symptoms in the shoulder.  On exam, patient has intact EPL, FPL, finger abduction, finger adduction, grip strength testing, pronation/supination, bicep rated 5/5.  4/5 tricep strength of the left arm compared with 5/5 on the right.  He does have some reproduction of some of his radiation of pain down the arm with positive Spurling sign.  Negative Lhermitte sign.  He has increased pain with cervical spine range of motion.  Does have some external  rotation weakness that is rated 4/5 on exam today.  Decent supraspinatus and subscapularis strength rated 5/5.  He is able to actively lift the arm.  Does have a fairly significant amount of coarseness and grinding that is noted in the left shoulder that is concerning for rotator cuff pathology.  Plan is to order MRI of the cervical spine for further evaluation of left radiculopathy as well as MRI arthrogram of left shoulder for further evaluation of left rotator cuff tear.  He has weakness of the rotator cuff on exam as well as weakness of the tricep and continued radicular pain down the left arm that is suspicious for cervical radiculopathy.  He has a history of prior surgery.  Plan for further evaluation with MRI scans and he will follow-up after MRI to review results.  Follow-Up Instructions: No follow-ups on file.   Orders:  Orders Placed This Encounter  Procedures   MR Cervical Spine w/o contrast   MR Shoulder Left w/ contrast   Arthrogram   No orders of the defined types were placed in this encounter.   Imaging: No results found.  PMFS History: Patient Active Problem List   Diagnosis Date Noted   DDD (degenerative disc disease), cervical 02/04/2022   DDD (degenerative disc disease), lumbar 02/04/2022   Primary osteoarthritis of both hands 02/04/2022  Arthritis of both acromioclavicular joints 02/04/2022   GERD (gastroesophageal reflux disease) 09/02/2021   Sigmoid diverticulitis 10/01/2019   Family hx of colon cancer 10/10/2018   Rectal bleeding 10/10/2018   History of colonic polyps 10/10/2018   Gout 08/10/2017   Unspecified constipation 02/21/2013   Past Medical History:  Diagnosis Date   Dysphagia 08/10/2017   Gout    Gout 08/10/2017   Iritis    dx by Dr. Jorja Loa   Kidney stones     Family History  Problem Relation Age of Onset   Dementia Mother    Kidney cancer Father    Kidney failure Brother    Congestive Heart Failure Brother    Healthy Brother    Heart  Problems Son    Healthy Son    Healthy Daughter     Past Surgical History:  Procedure Laterality Date   BACK SURGERY     BIOPSY  11/13/2018   Procedure: BIOPSY;  Surgeon: Rogene Houston, MD;  [facility];  Service: Endoscopy;;  terminal ileum colon   COLONOSCOPY N/A 03/01/2013   Procedure: COLONOSCOPY;  Surgeon: Rogene Houston, MD;  [facility];  Service: Endoscopy;  Laterality: N/A;  100   COLONOSCOPY N/A 11/13/2018   Procedure: COLONOSCOPY;  Surgeon: Rogene Houston, MD;  [facility];  Service: Endoscopy;  Laterality: N/A;  1015am   EGD/ED     years ago   ESOPHAGEAL DILATION N/A 08/31/2017   Procedure: ESOPHAGEAL DILATION;  Surgeon: Rogene Houston, MD;  [facility];  Service: Endoscopy;  Laterality: N/A;   ESOPHAGOGASTRODUODENOSCOPY N/A 08/31/2017   Procedure: ESOPHAGOGASTRODUODENOSCOPY (EGD);  Surgeon: Rogene Houston, MD;  [facility];  Service: Endoscopy;  Laterality: N/A;  3:10   NECK SURGERY     x2   POLYPECTOMY  11/13/2018   Procedure: POLYPECTOMY;  Surgeon: Rogene Houston, MD;  [facility];  Service: Endoscopy;;  colon   Rotator cuff surgery Right    Social History   Occupational History   Not on file  Tobacco Use   Smoking status: Never    Passive exposure: Past   Smokeless tobacco: Never  Vaping Use   Vaping Use: Never used  Substance and Sexual Activity   Alcohol use: No   Drug use: No   Sexual activity: Not on file

## 2022-10-15 NOTE — Addendum Note (Signed)
Addended by: Donella Stade on: 10/15/2022 02:22 PM   Modules accepted: Level of Service

## 2022-11-05 ENCOUNTER — Ambulatory Visit
Admission: RE | Admit: 2022-11-05 | Discharge: 2022-11-05 | Disposition: A | Discharge status: Home / Self Care | Payer: Medicare HMO | Source: Ambulatory Visit | Attending: Orthopedic Surgery | Admitting: Orthopedic Surgery

## 2022-11-05 ENCOUNTER — Other Ambulatory Visit: Payer: Self-pay | Admitting: Orthopedic Surgery

## 2022-11-05 ENCOUNTER — Other Ambulatory Visit: Payer: Self-pay

## 2022-11-05 DIAGNOSIS — Z77018 Contact with and (suspected) exposure to other hazardous metals: Secondary | ICD-10-CM

## 2022-11-05 DIAGNOSIS — Z135 Encounter for screening for eye and ear disorders: Secondary | ICD-10-CM | POA: Diagnosis not present

## 2022-11-05 DIAGNOSIS — M25512 Pain in left shoulder: Secondary | ICD-10-CM | POA: Diagnosis not present

## 2022-11-05 DIAGNOSIS — M4802 Spinal stenosis, cervical region: Secondary | ICD-10-CM | POA: Diagnosis not present

## 2022-11-05 DIAGNOSIS — S46012A Strain of muscle(s) and tendon(s) of the rotator cuff of left shoulder, initial encounter: Secondary | ICD-10-CM | POA: Diagnosis not present

## 2022-11-05 DIAGNOSIS — M542 Cervicalgia: Secondary | ICD-10-CM

## 2022-11-05 MED ORDER — IOPAMIDOL (ISOVUE-M 200) INJECTION 41%
15.0000 mL | Freq: Once | INTRAMUSCULAR | Status: AC
  Administered 2022-11-05: 15 mL via INTRA_ARTICULAR

## 2022-11-16 ENCOUNTER — Ambulatory Visit: Payer: Medicare HMO | Admitting: Orthopedic Surgery

## 2022-11-16 DIAGNOSIS — G5602 Carpal tunnel syndrome, left upper limb: Secondary | ICD-10-CM | POA: Diagnosis not present

## 2022-11-16 DIAGNOSIS — M542 Cervicalgia: Secondary | ICD-10-CM

## 2022-11-17 ENCOUNTER — Encounter: Payer: Self-pay | Admitting: Orthopedic Surgery

## 2022-11-17 NOTE — Progress Notes (Signed)
Office Visit Note   Patient: Ryan Fuller           Date of Birth: Sep 04, 1966           MRN: [id number] Visit Date: 11/16/2022 Requested by: [name], [address] [place],  [place] [address] PCP: [name], MD  Subjective: Chief Complaint  Patient presents with   Other     Scan review    HPI: Lam Ryan Fuller is a 57 y.o. male who presents to the office reporting continued left hand pain along with left shoulder and neck pain.  Since he was last seen he has had an MRI scan of the cervical spine.  This shows prior fusion C4-C7 without residual spinal stenosis.  There is some mild left C7 foraminal narrowing.  MRI of the left shoulder demonstrates full-thickness tear of the majority of the supraspinatus tendon footprint measuring about 2 and half centimeters in the anterior posterior direction.  There is also some subscapularis tendon tearing superiorly with medial dislocation of of the biceps tendon out of the groove.  The biceps tendon is anterior to that lesser tuberosity.  AC joint degenerative changes also present.  Patient states that he is dropping things with the left hand.  He does farm work.  Right side is okay.  Has had prior right shoulder rotator cuff repair..                ROS: All systems reviewed are negative as they relate to the chief complaint within the history of present illness.  Patient denies fevers or chills.  Assessment & Plan: Visit Diagnoses:  1. Carpal tunnel syndrome, left upper limb   2. Cervicalgia     Plan: Impression is left shoulder rotator cuff tear.  He is having some prolonged symptoms of numbness in the left hand as well but his abductor pollicis brevis strength is intact.  Cervical spine does not really show much in terms of lateralizing left-sided pathology.  After long discussion with [name] he would like to have the shoulder fixed sometime in February.  The risk and benefits are essentially the same as for his right-hand side.   The rotator cuff tear looks repairable at this time.  Patient understands risk benefits and wishes to proceed.  All questions answered.  We may need to consider repeat nerve conduction testing on that left hand if the symptoms persist.  Follow-Up Instructions: No follow-ups on file.   Orders:  No orders of the defined types were placed in this encounter.  No orders of the defined types were placed in this encounter.     Procedures: No procedures performed   Clinical Data: No additional findings.  Objective: Vital Signs: There were no vitals taken for this visit.  Physical Exam:  Constitutional: Patient appears well-developed HEENT:  Head: Normocephalic Eyes:EOM are normal Neck: Normal range of motion Cardiovascular: Normal rate Pulmonary/chest: Effort normal Neurologic: Patient is alert Skin: Skin is warm Psychiatric: Patient has normal mood and affect  Ortho Exam: Ortho exam demonstrates weakness to infraspinatus and supraspinatus testing on the left-hand side.  Motor or sensory function to the arm intact but he does report some paresthesias digits 1 2 and 3.  No abductor pollicis brevis wasting.  Does have positive O'Brien's and speeds testing on the left.  Passive range of motion maintained on that left-hand side to 60/95/165.  No discrete AC joint tenderness to direct palpation.  Specialty Comments:  MRI CERVICAL SPINE WITHOUT CONTRAST  TECHNIQUE: Multiplanar, multisequence MR imaging of the cervical spine was performed. No intravenous contrast was administered.   COMPARISON:  MRI of the cervical spine 06/26/2021   FINDINGS: Alignment: No significant listhesis is present. Straightening of the normal cervical lordosis is stable.   Vertebrae: Marrow changes associated with fusion C4-7 stable. Minimal endplate edema inferiorly at C3 stable. Marrow signal and vertebral body heights are otherwise normal.   Cord: Normal signal and morphology.   Posterior Fossa,  vertebral arteries, paraspinal tissues: Craniocervical junction is normal. Flow is present in the vertebral arteries bilaterally. Visualized intracranial contents are normal.   Disc levels:   C2-3: Mild rightward disc osteophyte complex is present. Central canal is patent. Mild foraminal narrowing bilaterally is stable. Extraforaminal left synovial cyst again noted.   C3-4: Broad-based disc osteophyte complex is stable. Central canal is patent. Facet hypertrophy is worse on the left. Moderate left and mild right foraminal narrowing is stable.   C4-5: Solid fusion is present anteriorly. No residual recurrent stenosis or change is present.   C5-6: Solid anterior fusion is present. The central canal is patent. Moderate right and mild left osseous foraminal narrowing is stable.   C6-7: Solid anterior fusion is present. No residual recurrent stenosis is present.   C7-T1: A right paramedian soft disc protrusion is again noted. This potentially contacts the ventral surface of the cord. The foramina are patent bilaterally.   IMPRESSION: 1. Stable anterior fusion at C4-5, C5-6, and C6-7. 2. Stable moderate right and mild left osseous foraminal narrowing at C5-6. 3. Stable right paramedian soft disc protrusion at C7-T1 potentially contacts the ventral surface of the cord without significant foraminal stenosis. 4. Stable moderate left and mild right foraminal narrowing at C3-4. 5. Mild foraminal narrowing bilaterally at C2-3 is stable.     Electronically Signed   By: San Morelle M.D.   On: 10/17/2021 17:06  Imaging: No results found.   PMFS History: Patient Active Problem List   Diagnosis Date Noted   DDD (degenerative disc disease), cervical 02/04/2022   DDD (degenerative disc disease), lumbar 02/04/2022   Primary osteoarthritis of both hands 02/04/2022   Arthritis of both acromioclavicular joints 02/04/2022   GERD (gastroesophageal reflux disease) 09/02/2021    Sigmoid diverticulitis 10/01/2019   Family hx of colon cancer 10/10/2018   Rectal bleeding 10/10/2018   History of colonic polyps 10/10/2018   Gout 08/10/2017   Unspecified constipation 02/21/2013   Past Medical History:  Diagnosis Date   Dysphagia 08/10/2017   Gout    Gout 08/10/2017   Iritis    dx by Dr. Jorja Loa   Kidney stones     Family History  Problem Relation Age of Onset   Dementia Mother    Kidney cancer Father    Kidney failure Brother    Congestive Heart Failure Brother    Healthy Brother    Heart Problems Son    Healthy Son    Healthy Daughter     Past Surgical History:  Procedure Laterality Date   BACK SURGERY     BIOPSY  11/13/2018   Procedure: BIOPSY;  Surgeon: Rogene Houston, MD;  [facility];  Service: Endoscopy;;  terminal ileum colon   COLONOSCOPY N/A 03/01/2013   Procedure: COLONOSCOPY;  Surgeon: Rogene Houston, MD;  [facility];  Service: Endoscopy;  Laterality: N/A;  100   COLONOSCOPY N/A 11/13/2018   Procedure: COLONOSCOPY;  Surgeon: Rogene Houston, MD;  [facility];  Service: Endoscopy;  Laterality: N/A;  1015am   EGD/ED     years ago   ESOPHAGEAL DILATION N/A 08/31/2017   Procedure: ESOPHAGEAL DILATION;  Surgeon: Rogene Houston, MD;  [facility];  Service: Endoscopy;  Laterality: N/A;   ESOPHAGOGASTRODUODENOSCOPY N/A 08/31/2017   Procedure: ESOPHAGOGASTRODUODENOSCOPY (EGD);  Surgeon: Rogene Houston, MD;  [facility];  Service: Endoscopy;  Laterality: N/A;  3:10   NECK SURGERY     x2   POLYPECTOMY  11/13/2018   Procedure: POLYPECTOMY;  Surgeon: Rogene Houston, MD;  [facility];  Service: Endoscopy;;  colon   Rotator cuff surgery Right    Social History   Occupational History   Not on file  Tobacco Use   Smoking status: Never    Passive exposure: Past   Smokeless tobacco: Never  Vaping Use   Vaping Use: Never used  Substance and Sexual Activity   Alcohol  use: No   Drug use: No   Sexual activity: Not on file

## 2022-11-25 DIAGNOSIS — Z6832 Body mass index (BMI) 32.0-32.9, adult: Secondary | ICD-10-CM | POA: Diagnosis not present

## 2022-11-25 DIAGNOSIS — J0100 Acute maxillary sinusitis, unspecified: Secondary | ICD-10-CM | POA: Diagnosis not present

## 2022-11-25 DIAGNOSIS — M159 Polyosteoarthritis, unspecified: Secondary | ICD-10-CM | POA: Diagnosis not present

## 2022-11-25 DIAGNOSIS — M5030 Other cervical disc degeneration, unspecified cervical region: Secondary | ICD-10-CM | POA: Diagnosis not present

## 2022-11-25 DIAGNOSIS — G2581 Restless legs syndrome: Secondary | ICD-10-CM | POA: Diagnosis not present

## 2022-11-25 DIAGNOSIS — E6609 Other obesity due to excess calories: Secondary | ICD-10-CM | POA: Diagnosis not present

## 2022-12-21 DIAGNOSIS — H04123 Dry eye syndrome of bilateral lacrimal glands: Secondary | ICD-10-CM | POA: Diagnosis not present

## 2022-12-27 ENCOUNTER — Encounter: Payer: Self-pay | Admitting: Orthopedic Surgery

## 2022-12-27 ENCOUNTER — Other Ambulatory Visit: Payer: Self-pay | Admitting: Surgical

## 2022-12-27 DIAGNOSIS — S43432A Superior glenoid labrum lesion of left shoulder, initial encounter: Secondary | ICD-10-CM | POA: Diagnosis not present

## 2022-12-27 DIAGNOSIS — M75122 Complete rotator cuff tear or rupture of left shoulder, not specified as traumatic: Secondary | ICD-10-CM | POA: Diagnosis not present

## 2022-12-27 DIAGNOSIS — M75102 Unspecified rotator cuff tear or rupture of left shoulder, not specified as traumatic: Secondary | ICD-10-CM | POA: Diagnosis not present

## 2022-12-27 DIAGNOSIS — M7522 Bicipital tendinitis, left shoulder: Secondary | ICD-10-CM | POA: Diagnosis not present

## 2022-12-27 DIAGNOSIS — M7552 Bursitis of left shoulder: Secondary | ICD-10-CM | POA: Diagnosis not present

## 2022-12-27 MED ORDER — GABAPENTIN 300 MG PO CAPS: 300.0000 mg | ORAL_CAPSULE | Freq: Three times a day (TID) | ORAL | 0 refills | Status: DC

## 2022-12-27 MED ORDER — METHOCARBAMOL 500 MG PO TABS: 500.0000 mg | ORAL_TABLET | Freq: Three times a day (TID) | ORAL | 1 refills | Status: DC | PRN

## 2022-12-27 MED ORDER — CELECOXIB 100 MG PO CAPS: 100.0000 mg | ORAL_CAPSULE | Freq: Two times a day (BID) | ORAL | 0 refills | Status: DC

## 2022-12-27 MED ORDER — OXYCODONE HCL 5 MG PO TABS: 5.0000 mg | ORAL_TABLET | ORAL | 0 refills | Status: DC | PRN

## 2022-12-28 ENCOUNTER — Telehealth: Payer: Self-pay | Admitting: Orthopedic Surgery

## 2022-12-28 NOTE — Telephone Encounter (Signed)
Patient wants to know if he can get something else call in for pain what he has is not working for him. [id number]

## 2022-12-28 NOTE — Telephone Encounter (Signed)
I would recommend trying to take 2 pills occasionally every 4 hours and see if that is helpful. If not and he still has severe pain tomorrow, we can try switching medications

## 2022-12-29 ENCOUNTER — Other Ambulatory Visit: Payer: Self-pay | Admitting: Surgical

## 2022-12-29 MED ORDER — OXYCODONE-ACETAMINOPHEN 5-325 MG PO TABS: 1.0000 | ORAL_TABLET | ORAL | 0 refills | Status: DC | PRN

## 2022-12-29 NOTE — Telephone Encounter (Signed)
I sent in Percocet instead of oxycodone hcl

## 2022-12-29 NOTE — Telephone Encounter (Signed)
IC advised.  

## 2023-01-03 ENCOUNTER — Ambulatory Visit (INDEPENDENT_AMBULATORY_CARE_PROVIDER_SITE_OTHER): Payer: Medicare HMO | Admitting: Orthopedic Surgery

## 2023-01-03 ENCOUNTER — Encounter: Payer: Self-pay | Admitting: Orthopedic Surgery

## 2023-01-03 DIAGNOSIS — M25512 Pain in left shoulder: Secondary | ICD-10-CM

## 2023-01-03 NOTE — Progress Notes (Signed)
Post-Op Visit Note   Patient: Ryan Fuller           Date of Birth: [birth year]           MRN: [id number] Visit Date: 01/03/2023 PCP: Ryan Sites, MD   Assessment & Plan:  Chief Complaint:  Chief Complaint  Patient presents with   Left Shoulder - Routine Post Op    12/27/22 left shoulder scope deb; MOBT & RCTR   Visit Diagnoses:  1. Left shoulder pain, unspecified chronicity     Plan: Ryan Fuller is now a week out left shoulder arthroscopy debridement mini open rotator cuff tear repair and biceps tenodesis.  Having fair amount of pain but the oxycodone helps him at night.  No muscle spasms.  Sutures DC'd today.  Deltoid fires.  Lacking about 10 degrees of full extension the elbow.  We talked about coming out of the sling daily to work on elbow range of motion.  Continue with CPM brace 3 times a day at least 1 hour.  2-week return at which time we will decide for or against coming out of the sling.  This was a 3 x 3 repair.  Will also need ultrasound that wrist to look at the median nerve and potentially set up for repeat nerve construction studies of that left hand to evaluate persistent numbness.  Follow-Up Instructions: No follow-ups on file.   Orders:  No orders of the defined types were placed in this encounter.  No orders of the defined types were placed in this encounter.   Imaging: No results found.  PMFS History: Patient Active Problem List   Diagnosis Date Noted   DDD (degenerative disc disease), cervical 02/04/2022   DDD (degenerative disc disease), lumbar 02/04/2022   Primary osteoarthritis of both hands 02/04/2022   Arthritis of both acromioclavicular joints 02/04/2022   GERD (gastroesophageal reflux disease) 09/02/2021   Sigmoid diverticulitis 10/01/2019   Family hx of colon cancer 10/10/2018   Rectal bleeding 10/10/2018   History of colonic polyps 10/10/2018   Gout 08/10/2017   Unspecified constipation 02/21/2013   Past Medical History:  Diagnosis Date    Dysphagia 08/10/2017   Gout    Gout 08/10/2017   Iritis    dx by Dr. Jorja Loa   Kidney stones     Family History  Problem Relation Age of Onset   Dementia Mother    Kidney cancer Father    Kidney failure Brother    Congestive Heart Failure Brother    Healthy Brother    Heart Problems Son    Healthy Son    Healthy Daughter     Past Surgical History:  Procedure Laterality Date   BACK SURGERY     BIOPSY  11/13/2018   Procedure: BIOPSY;  Surgeon: Rogene Houston, MD;  [facility];  Service: Endoscopy;;  terminal ileum colon   COLONOSCOPY N/A 03/01/2013   Procedure: COLONOSCOPY;  Surgeon: Rogene Houston, MD;  [facility];  Service: Endoscopy;  Laterality: N/A;  100   COLONOSCOPY N/A 11/13/2018   Procedure: COLONOSCOPY;  Surgeon: Rogene Houston, MD;  [facility];  Service: Endoscopy;  Laterality: N/A;  1015am   EGD/ED     years ago   ESOPHAGEAL DILATION N/A 08/31/2017   Procedure: ESOPHAGEAL DILATION;  Surgeon: Rogene Houston, MD;  [facility];  Service: Endoscopy;  Laterality: N/A;   ESOPHAGOGASTRODUODENOSCOPY N/A 08/31/2017   Procedure: ESOPHAGOGASTRODUODENOSCOPY (EGD);  Surgeon: Rogene Houston, MD;  [facility];  Service: Endoscopy;  Laterality: N/A;  3:10   NECK SURGERY     x2   POLYPECTOMY  11/13/2018   Procedure: POLYPECTOMY;  Surgeon: Rogene Houston, MD;  [facility];  Service: Endoscopy;;  colon   Rotator cuff surgery Right    Social History   Occupational History   Not on file  Tobacco Use   Smoking status: Never    Passive exposure: Past   Smokeless tobacco: Never  Vaping Use   Vaping Use: Never used  Substance and Sexual Activity   Alcohol use: No   Drug use: No   Sexual activity: Not on file

## 2023-01-19 ENCOUNTER — Ambulatory Visit (INDEPENDENT_AMBULATORY_CARE_PROVIDER_SITE_OTHER): Payer: Medicare HMO | Admitting: Orthopedic Surgery

## 2023-01-19 ENCOUNTER — Telehealth: Payer: Self-pay

## 2023-01-19 DIAGNOSIS — G5602 Carpal tunnel syndrome, left upper limb: Secondary | ICD-10-CM

## 2023-01-19 NOTE — Telephone Encounter (Signed)
Reach out to [place] per Dr Marlou Sa to see if we can transition from bionic sling to CPM-  Patient seen today in follow up from surgery stating sling is not helpful  Dr dean would like for [employer] to accommodate patient and provide with same pricing from right shoulder needs CPM x 2 weeks

## 2023-01-20 NOTE — Telephone Encounter (Signed)
IC talked with Clydene Laming will reach out to patient and get this arranged for patient. Starting CPM at 60-70 per Dr Marlou Sa and not going beyond 69

## 2023-01-22 ENCOUNTER — Encounter: Payer: Self-pay | Admitting: Orthopedic Surgery

## 2023-01-22 NOTE — Progress Notes (Signed)
Post-Op Visit Note   Patient: Ryan Fuller           Date of Birth: 03-17-1966           MRN: [id number] Visit Date: 01/19/2023 PCP: Ryan Sites, MD   Assessment & Plan:  Chief Complaint:  Chief Complaint  Patient presents with   Left Shoulder - Routine Post Op     12/27/22 left shoulder scope deb; MOBT & RCTR     Visit Diagnoses:  1. Carpal tunnel syndrome, left upper limb     Plan: Ryan Fuller is a patient who is now about a month out left shoulder arthroscopy with large to massive rotator cuff tear repair using 3 x 3 construct.  In bionic sling.  Taking Ultram with relief.  Using pulley at home.  Reports generally a lot of soreness and some difficulty with sleeping due to that left shoulder pain.  He is also having some continued numbness and tingling in the left fingers.  On examination he is A little coarseness with internal and external rotation at 90 degrees of abduction.  EPL FPL interosseous strength intact in the left hand with very functional and abductor pollicis brevis strength.  Has tenderness to palpation right at the wrist flexion crease.  Plan at this time is ultrasound examination of that median nerve does not show any significant swelling or obvious damage.  Needs nerve conduction study of that left wrist to evaluate nerve conduction of the median nerve.  Would also like to use a shoulder CPM on that left-hand side for about 2 weeks.  Come back in 4 weeks for clinical recheck.  Continue with the sling for 2 more weeks based on the size of the rotator cuff tear.  Follow-Up Instructions: No follow-ups on file.   Orders:  Orders Placed This Encounter  Procedures   Ambulatory referral to Physical Medicine Rehab   No orders of the defined types were placed in this encounter.   Imaging: No results found.  PMFS History: Patient Active Problem List   Diagnosis Date Noted   DDD (degenerative disc disease), cervical 02/04/2022   DDD (degenerative disc disease),  lumbar 02/04/2022   Primary osteoarthritis of both hands 02/04/2022   Arthritis of both acromioclavicular joints 02/04/2022   GERD (gastroesophageal reflux disease) 09/02/2021   Sigmoid diverticulitis 10/01/2019   Family hx of colon cancer 10/10/2018   Rectal bleeding 10/10/2018   History of colonic polyps 10/10/2018   Gout 08/10/2017   Unspecified constipation 02/21/2013   Past Medical History:  Diagnosis Date   Dysphagia 08/10/2017   Gout    Gout 08/10/2017   Iritis    dx by Dr. Jorja Loa   Kidney stones     Family History  Problem Relation Age of Onset   Dementia Mother    Kidney cancer Father    Kidney failure Brother    Congestive Heart Failure Brother    Healthy Brother    Heart Problems Son    Healthy Son    Healthy Daughter     Past Surgical History:  Procedure Laterality Date   BACK SURGERY     BIOPSY  11/13/2018   Procedure: BIOPSY;  Surgeon: Rogene Houston, MD;  [facility];  Service: Endoscopy;;  terminal ileum colon   COLONOSCOPY N/A 03/01/2013   Procedure: COLONOSCOPY;  Surgeon: Rogene Houston, MD;  [facility];  Service: Endoscopy;  Laterality: N/A;  100   COLONOSCOPY N/A 11/13/2018   Procedure: COLONOSCOPY;  Surgeon: Rogene Houston, MD;  [facility];  Service: Endoscopy;  Laterality: N/A;  1015am   EGD/ED     years ago   ESOPHAGEAL DILATION N/A 08/31/2017   Procedure: ESOPHAGEAL DILATION;  Surgeon: Rogene Houston, MD;  [facility];  Service: Endoscopy;  Laterality: N/A;   ESOPHAGOGASTRODUODENOSCOPY N/A 08/31/2017   Procedure: ESOPHAGOGASTRODUODENOSCOPY (EGD);  Surgeon: Rogene Houston, MD;  [facility];  Service: Endoscopy;  Laterality: N/A;  3:10   NECK SURGERY     x2   POLYPECTOMY  11/13/2018   Procedure: POLYPECTOMY;  Surgeon: Rogene Houston, MD;  [facility];  Service: Endoscopy;;  colon   Rotator cuff surgery Right    Social History   Occupational History   Not  on file  Tobacco Use   Smoking status: Never    Passive exposure: Past   Smokeless tobacco: Never  Vaping Use   Vaping Use: Never used  Substance and Sexual Activity   Alcohol use: No   Drug use: No   Sexual activity: Not on file

## 2023-01-26 ENCOUNTER — Telehealth: Payer: Self-pay | Admitting: Orthopedic Surgery

## 2023-01-26 NOTE — Telephone Encounter (Signed)
Pt's wife Ryan Fuller called stating he is on the CPM machine and he reaches 35 tonight and pt need to know what to do afterwards since his next appt is not until April. Please call Ryan Fuller at [phone].

## 2023-01-26 NOTE — Telephone Encounter (Signed)
I would not go more than 100 degrees in the shoulder

## 2023-01-27 NOTE — Telephone Encounter (Signed)
IC advised-verbalized understanding.  °

## 2023-02-01 ENCOUNTER — Ambulatory Visit (INDEPENDENT_AMBULATORY_CARE_PROVIDER_SITE_OTHER): Payer: Medicare HMO | Admitting: Physical Medicine and Rehabilitation

## 2023-02-01 DIAGNOSIS — R202 Paresthesia of skin: Secondary | ICD-10-CM

## 2023-02-01 DIAGNOSIS — R531 Weakness: Secondary | ICD-10-CM | POA: Diagnosis not present

## 2023-02-01 DIAGNOSIS — Z981 Arthrodesis status: Secondary | ICD-10-CM

## 2023-02-01 DIAGNOSIS — M25532 Pain in left wrist: Secondary | ICD-10-CM

## 2023-02-01 NOTE — Progress Notes (Signed)
Ryan Fuller - 57 y.o. male MRN [id number]  Date of birth: 04/27/1966  Office Visit Note: Visit Date: 02/01/2023 PCP: [name], MD Referred by: [name], MD  Subjective: Chief Complaint  Patient presents with   Left Hand - Numbness, Weakness   Left Wrist - Pain   HPI: Ryan Fuller is a 57 y.o. male who comes in today at the request of Dr. [name] for evaluation and management of chronic, worsening and severe pain, numbness and tingling in the Left upper extremities.  Patient is Right hand dominant.  He is someone and I will see in the past for electrodiagnostic studies.  Those are reviewed fully below but showed moderate median neuropathy at the wrist in May of last year.  He comes in today with continued left wrist pain with left hand numbness mainly in the radial digits with a feeling of swelling and weakness.  He is having difficulty grasping objects.  Rates his pain on the functional scale is 5 out of 10.  This is despite undergoing left shoulder arthroscopy with large to massive rotator cuff tear repair and left carpal tunnel release on December 27, 2022.  Prior right-sided carpal tunnel release has done very well and he is without real symptoms on the right.  His course is complicated by fact that he has also had prior cervical fusion.  He had a C4-7 fusion with new MRI showing really fairly mild foraminal narrowing but no central stenosis.  Really shows more right-sided than left-sided pathology all of which fairly mild.  He is not diabetic but does have a history of gout.  Really denies symptoms down the arm per se.       Review of Systems  Musculoskeletal:  Positive for back pain, joint pain and neck pain.  Neurological:  Positive for tingling and weakness.  All other systems reviewed and are negative.  Otherwise per HPI.  Assessment & Plan: Visit Diagnoses:    ICD-10-CM   1. Paresthesia of skin  R20.2 NCV with EMG (electromyography)    2. Pain in  left wrist  M25.532     3. Weakness  R53.1     4. S/P cervical spinal fusion  Z98.1        Plan: Impression: Unresolved and somewhat worsened left hand pain with numbness and tingling and weakness feeling status post carpal tunnel release.  Prior electrodiagnostic study showed moderate entrapment.  Complicated history of cervical fusion.  Electrodiagnostic study performed.  The above electrodiagnostic study is ABNORMAL and reveals evidence of a mild left median nerve entrapment at the wrist (carpal tunnel syndrome) affecting sensory components.  This represents improvement from the prior study in May of last year.  There is no significant electrodiagnostic evidence of any other focal nerve entrapment, brachial plexopathy or cervical radiculopathy.  **As you know, this particular electrodiagnostic study cannot rule out chemical radiculitis or sensory only radiculopathy.  Obviously this does not check every branch of every myelinated nerve.  Recommendations: 1.  Follow-up with referring physician.  Careful clinical correlation is paramount.  Fairly early electrodiagnostic study post release but does show improvement from prior study. 2.  Continue current management of symptoms.  Meds & Orders: No orders of the defined types were placed in this encounter.   Orders Placed This Encounter  Procedures   NCV with EMG (electromyography)    Follow-up: Return for  [name], MD.   Procedures: No procedures performed  EMG & NCV  Findings: Evaluation of the left median (across palm) sensory nerve showed prolonged distal peak latency (Wrist, 4.0 ms).  All remaining nerves (as indicated in the following tables) were within normal limits.    All examined muscles (as indicated in the following table) showed no evidence of electrical instability.    Impression: The above electrodiagnostic study is ABNORMAL and reveals evidence of a mild left median nerve entrapment at the wrist (carpal tunnel  syndrome) affecting sensory components.  This represents improvement from the prior study in May of last year.  There is no significant electrodiagnostic evidence of any other focal nerve entrapment, brachial plexopathy or cervical radiculopathy.  **As you know, this particular electrodiagnostic study cannot rule out chemical radiculitis or sensory only radiculopathy.  Obviously this does not check every branch of every myelinated nerve.  Recommendations: 1.  Follow-up with referring physician.  Careful clinical correlation is paramount. 2.  Continue current management of symptoms.  ___________________________ [name] Board Certified, American Board of Physical Medicine and Rehabilitation    Nerve Conduction Studies Anti Sensory Summary Table   Stim Site NR Peak (ms) Norm Peak (ms) P-T Amp (V) Norm P-T Amp Site1 Site2 Delta-P (ms) Dist (cm) Vel (m/s) Norm Vel (m/s)  Left Median Acr Palm Anti Sensory (2nd Digit)  30.6C  Wrist    *4.0 <3.6 17.2 >10 Wrist Palm 2.0 0.0    Palm    2.0 <2.0 17.4         Left Radial Anti Sensory (Base 1st Digit)  30.7C  Wrist    2.3 <3.1 20.6  Wrist Base 1st Digit 2.3 0.0    Left Ulnar Anti Sensory (5th Digit)  31C  Wrist    3.4 <3.7 19.7 >15.0 Wrist 5th Digit 3.4 14.0 41 >38   Motor Summary Table   Stim Site NR Onset (ms) Norm Onset (ms) O-P Amp (mV) Norm O-P Amp Site1 Site2 Delta-0 (ms) Dist (cm) Vel (m/s) Norm Vel (m/s)  Left Median Motor (Abd Poll Brev)  30.9C  Wrist    4.1 <4.2 5.1 >5 Elbow Wrist 4.4 23.0 52 >50  Elbow    8.5  5.3         Left Ulnar Motor (Abd Dig Min)  31.1C  Wrist    2.8 <4.2 10.2 >3 B Elbow Wrist 4.5 25.0 56 >53  B Elbow    7.3  6.6  A Elbow B Elbow 1.6 10.0 62 >53  A Elbow    8.9  8.1          EMG   Side Muscle Nerve Root Ins Act Fibs Psw Amp Dur Poly Recrt Int Fraser Din Comment  Left Abd Poll Brev Median C8-T1 Nml Nml Nml Nml Nml 0 Nml Nml   Left 1stDorInt Ulnar C8-T1 Nml Nml Nml Nml Nml 0 Nml Nml   Left  PronatorTeres Median C6-7 Nml Nml Nml Nml Nml 0 Nml Nml   Left Biceps Musculocut C5-6 Nml Nml Nml Nml Nml 0 Nml Nml   Left Deltoid Axillary C5-6 Nml Nml Nml Nml Nml 0 Nml Nml     Nerve Conduction Studies Anti Sensory Left/Right Comparison   Stim Site L Lat (ms) R Lat (ms) L-R Lat (ms) L Amp (V) R Amp (V) L-R Amp (%) Site1 Site2 L Vel (m/s) R Vel (m/s) L-R Vel (m/s)  Median Acr Palm Anti Sensory (2nd Digit)  30.6C  Wrist *4.0   17.2   Wrist Palm     Palm 2.0   17.4  Radial Anti Sensory (Base 1st Digit)  30.7C  Wrist 2.3   20.6   Wrist Base 1st Digit     Ulnar Anti Sensory (5th Digit)  31C  Wrist 3.4   19.7   Wrist 5th Digit 41     Motor Left/Right Comparison   Stim Site L Lat (ms) R Lat (ms) L-R Lat (ms) L Amp (mV) R Amp (mV) L-R Amp (%) Site1 Site2 L Vel (m/s) R Vel (m/s) L-R Vel (m/s)  Median Motor (Abd Poll Brev)  30.9C  Wrist 4.1   5.1   Elbow Wrist 52    Elbow 8.5   5.3         Ulnar Motor (Abd Dig Min)  31.1C  Wrist 2.8   10.2   B Elbow Wrist 56    B Elbow 7.3   6.6   A Elbow B Elbow 62    A Elbow 8.9   8.1            Waveforms:             Clinical History: 03/17/2022 EMG/NCS Impression: The above electrodiagnostic study is ABNORMAL and reveals evidence of a moderate Bilateral median nerve entrapment at the wrist (carpal tunnel syndrome) affecting sensory and motor components.    There is no significant electrodiagnostic evidence of any other focal nerve entrapment, brachial plexopathy or cervical radiculopathy.    Recommendations: 1.  Follow-up with referring physician. 2.  Continue current management of symptoms. 3.  Continue use of resting splint at night-time and as needed during the day. 4.  Suggest surgical evaluation.  Micael Hampshire, MD ------- MRI CERVICAL SPINE WITHOUT CONTRAST   TECHNIQUE: Multiplanar, multisequence MR imaging of the cervical spine was performed. No intravenous contrast was administered.   COMPARISON:  Previous MRI  from 10/16/2021.   FINDINGS: Alignment: Straightening of the normal cervical lordosis. No significant listhesis.   Vertebrae: Postoperative changes from prior fusion at C4 through C7, with anterior plate screw fixation in place at C6-7. Vertebral body height maintained without acute or chronic fracture. Bone marrow signal intensity heterogeneous but overall within normal limits. No worrisome osseous lesions. No abnormal marrow edema.   Cord: Normal signal and morphology.   Posterior Fossa, vertebral arteries, paraspinal tissues: Visualized brain and posterior fossa within normal limits. Craniocervical junction normal. Mild soft tissue edema adjacent to the C7 spinous process, most commonly seen with bursitis (series 7, image 10). Paraspinous soft tissues demonstrate no other acute finding. Normal flow voids seen within the vertebral arteries bilaterally.   Disc levels:   C2-C3: Normal interspace. Mild bilateral facet hypertrophy. Small extraforaminal synovial cyst again noted on the left, stable (series 8, image 6). No spinal stenosis. Foramina remain patent.   C3-C4: Mild disc bulge with uncovertebral spurring. Mild bilateral facet and ligament flavum hypertrophy. No spinal stenosis. Mild bilateral foraminal narrowing, stable.   C4-C5: Prior fusion. No residual spinal stenosis. Foramina remain patent.   C5-C6: Prior fusion. No residual spinal stenosis. Right worse than left uncovertebral and facet hypertrophy with residual moderate right C6 foraminal narrowing. Left neural foramen remains patent.   C6-C7: Prior fusion. No residual spinal stenosis. Uncovertebral spurring with residual mild left C7 foraminal stenosis. Right neural foramen remains patent.   C7-T1: Small central to right paracentral disc protrusion with annular fissure indents the ventral thecal sac (series 8, image 33). Mild facet hypertrophy. No significant spinal stenosis. Foramina remain patent.   Note  made of mild noncompressive disc bulging at  the T2-3 level without stenosis.   IMPRESSION: 1. Prior fusion at C4 through C7 without residual spinal stenosis. Residual moderate right C6 and mild left C7 foraminal narrowing related to uncovertebral and facet disease. Overall appearance is stable. 2. Small central to right paracentral disc protrusion at C7-T1 without significant stenosis. This is slightly decreased in size from prior. 3. Mild bilateral foraminal narrowing at C3-4 related to disc bulge and uncovertebral disease, stable. 4. Mild soft tissue edema about the C7 spinous process, most commonly seen in the setting of bursitis.     Electronically Signed   By: Jeannine Boga M.D.   On: 11/08/2022 02:24   He reports that he has never smoked. He has been exposed to tobacco smoke. He has never used smokeless tobacco. No results for input(s): "HGBA1C", "LABURIC" in the last 8760 hours.  Objective:  VS:  HT:    WT:   BMI:     BP:   HR: bpm  TEMP: ( )  RESP:  Physical Exam Vitals and nursing note reviewed.  Constitutional:      General: He is not in acute distress.    Appearance: Normal appearance. He is well-developed.  HENT:     Head: Normocephalic and atraumatic.  Eyes:     Conjunctiva/sclera: Conjunctivae normal.     Pupils: Pupils are equal, round, and reactive to light.  Cardiovascular:     Rate and Rhythm: Normal rate.     Pulses: Normal pulses.     Heart sounds: Normal heart sounds.  Pulmonary:     Effort: Pulmonary effort is normal. No respiratory distress.  Musculoskeletal:        General: No tenderness.     Cervical back: Normal range of motion and neck supple. No rigidity.     Right lower leg: No edema.     Left lower leg: No edema.     Comments: Inspection reveals well-healed surgical scars about both wrist as well as well-healed scar of the left shoulder but no atrophy of the bilateral APB or FDI or hand intrinsics. There is no swelling, color  changes, allodynia or dystrophic changes. There is 5 out of 5 strength in the bilateral wrist extension, finger abduction and long finger flexion. There is intact sensation to light touch in all dermatomal and peripheral nerve distributions. There is a negative Froment's test on the left. There is a negative Tinel's test at the bilateral wrist and elbow. There is a negative Phalen's test bilaterally. There is a negative Hoffmann's test bilaterally.  Skin:    General: Skin is warm and dry.     Findings: No erythema or rash.  Neurological:     General: No focal deficit present.     Mental Status: He is alert and oriented to person, place, and time.     Sensory: No sensory deficit.     Motor: No weakness or abnormal muscle tone.     Coordination: Coordination normal.     Gait: Gait normal.  Psychiatric:        Mood and Affect: Mood normal.        Behavior: Behavior normal.        Thought Content: Thought content normal.     Ortho Exam  Imaging: No results found.  Past Medical/Family/Surgical/Social History: Medications & Allergies reviewed per EMR, new medications updated. Patient Active Problem List   Diagnosis Date Noted   DDD (degenerative disc disease), cervical 02/04/2022   DDD (degenerative disc disease), lumbar 02/04/2022  Primary osteoarthritis of both hands 02/04/2022   Arthritis of both acromioclavicular joints 02/04/2022   GERD (gastroesophageal reflux disease) 09/02/2021   Sigmoid diverticulitis 10/01/2019   Family hx of colon cancer 10/10/2018   Rectal bleeding 10/10/2018   History of colonic polyps 10/10/2018   Gout 08/10/2017   Unspecified constipation 02/21/2013   Past Medical History:  Diagnosis Date   Dysphagia 08/10/2017   Gout    Gout 08/10/2017   Iritis    dx by Dr. Jorja Loa   Kidney stones    Family History  Problem Relation Age of Onset   Dementia Mother    Kidney cancer Father    Kidney failure Brother    Congestive Heart Failure Brother     Healthy Brother    Heart Problems Son    Healthy Son    Healthy Daughter    Past Surgical History:  Procedure Laterality Date   BACK SURGERY     BIOPSY  11/13/2018   Procedure: BIOPSY;  Surgeon: Rogene Houston, MD;  [facility];  Service: Endoscopy;;  terminal ileum colon   COLONOSCOPY N/A 03/01/2013   Procedure: COLONOSCOPY;  Surgeon: Rogene Houston, MD;  [facility];  Service: Endoscopy;  Laterality: N/A;  100   COLONOSCOPY N/A 11/13/2018   Procedure: COLONOSCOPY;  Surgeon: Rogene Houston, MD;  [facility];  Service: Endoscopy;  Laterality: N/A;  1015am   EGD/ED     years ago   ESOPHAGEAL DILATION N/A 08/31/2017   Procedure: ESOPHAGEAL DILATION;  Surgeon: Rogene Houston, MD;  [facility];  Service: Endoscopy;  Laterality: N/A;   ESOPHAGOGASTRODUODENOSCOPY N/A 08/31/2017   Procedure: ESOPHAGOGASTRODUODENOSCOPY (EGD);  Surgeon: Rogene Houston, MD;  [facility];  Service: Endoscopy;  Laterality: N/A;  3:10   NECK SURGERY     x2   POLYPECTOMY  11/13/2018   Procedure: POLYPECTOMY;  Surgeon: Rogene Houston, MD;  [facility];  Service: Endoscopy;;  colon   Rotator cuff surgery Right    Social History   Occupational History   Not on file  Tobacco Use   Smoking status: Never    Passive exposure: Past   Smokeless tobacco: Never  Vaping Use   Vaping Use: Never used  Substance and Sexual Activity   Alcohol use: No   Drug use: No   Sexual activity: Not on file

## 2023-02-01 NOTE — Progress Notes (Signed)
Functional Pain Scale - descriptive words and definitions  Distracting (5)    Aware of pain/able to complete some ADL's but limited by pain/sleep is affected and active distractions are only slightly useful. Moderate range order  Average Pain 4  Right handed. Left wrist pain, left hand numbness, swelling and weakness. Hard to grasp things

## 2023-02-07 NOTE — Procedures (Signed)
EMG & NCV Findings: Evaluation of the left median (across palm) sensory nerve showed prolonged distal peak latency (Wrist, 4.0 ms).  All remaining nerves (as indicated in the following tables) were within normal limits.    All examined muscles (as indicated in the following table) showed no evidence of electrical instability.    Impression: The above electrodiagnostic study is ABNORMAL and reveals evidence of a mild left median nerve entrapment at the wrist (carpal tunnel syndrome) affecting sensory components.  This represents improvement from the prior study in May of last year.  There is no significant electrodiagnostic evidence of any other focal nerve entrapment, brachial plexopathy or cervical radiculopathy.  **As you know, this particular electrodiagnostic study cannot rule out chemical radiculitis or sensory only radiculopathy.  Obviously this does not check every branch of every myelinated nerve.  Recommendations: 1.  Follow-up with referring physician.  Careful clinical correlation is paramount. 2.  Continue current management of symptoms.  ___________________________ [name] Board Certified, American Board of Physical Medicine and Rehabilitation    Nerve Conduction Studies Anti Sensory Summary Table   Stim Site NR Peak (ms) Norm Peak (ms) P-T Amp (V) Norm P-T Amp Site1 Site2 Delta-P (ms) Dist (cm) Vel (m/s) Norm Vel (m/s)  Left Median Acr Palm Anti Sensory (2nd Digit)  30.6C  Wrist    *4.0 <3.6 17.2 >10 Wrist Palm 2.0 0.0    Palm    2.0 <2.0 17.4         Left Radial Anti Sensory (Base 1st Digit)  30.7C  Wrist    2.3 <3.1 20.6  Wrist Base 1st Digit 2.3 0.0    Left Ulnar Anti Sensory (5th Digit)  31C  Wrist    3.4 <3.7 19.7 >15.0 Wrist 5th Digit 3.4 14.0 41 >38   Motor Summary Table   Stim Site NR Onset (ms) Norm Onset (ms) O-P Amp (mV) Norm O-P Amp Site1 Site2 Delta-0 (ms) Dist (cm) Vel (m/s) Norm Vel (m/s)  Left Median Motor (Abd Poll Brev)  30.9C  Wrist     4.1 <4.2 5.1 >5 Elbow Wrist 4.4 23.0 52 >50  Elbow    8.5  5.3         Left Ulnar Motor (Abd Dig Min)  31.1C  Wrist    2.8 <4.2 10.2 >3 B Elbow Wrist 4.5 25.0 56 >53  B Elbow    7.3  6.6  A Elbow B Elbow 1.6 10.0 62 >53  A Elbow    8.9  8.1          EMG   Side Muscle Nerve Root Ins Act Fibs Psw Amp Dur Poly Recrt Int Fraser Din Comment  Left Abd Poll Brev Median C8-T1 Nml Nml Nml Nml Nml 0 Nml Nml   Left 1stDorInt Ulnar C8-T1 Nml Nml Nml Nml Nml 0 Nml Nml   Left PronatorTeres Median C6-7 Nml Nml Nml Nml Nml 0 Nml Nml   Left Biceps Musculocut C5-6 Nml Nml Nml Nml Nml 0 Nml Nml   Left Deltoid Axillary C5-6 Nml Nml Nml Nml Nml 0 Nml Nml     Nerve Conduction Studies Anti Sensory Left/Right Comparison   Stim Site L Lat (ms) R Lat (ms) L-R Lat (ms) L Amp (V) R Amp (V) L-R Amp (%) Site1 Site2 L Vel (m/s) R Vel (m/s) L-R Vel (m/s)  Median Acr Palm Anti Sensory (2nd Digit)  30.6C  Wrist *4.0   17.2   Wrist Palm     Palm 2.0  17.4         Radial Anti Sensory (Base 1st Digit)  30.7C  Wrist 2.3   20.6   Wrist Base 1st Digit     Ulnar Anti Sensory (5th Digit)  31C  Wrist 3.4   19.7   Wrist 5th Digit 41     Motor Left/Right Comparison   Stim Site L Lat (ms) R Lat (ms) L-R Lat (ms) L Amp (mV) R Amp (mV) L-R Amp (%) Site1 Site2 L Vel (m/s) R Vel (m/s) L-R Vel (m/s)  Median Motor (Abd Poll Brev)  30.9C  Wrist 4.1   5.1   Elbow Wrist 52    Elbow 8.5   5.3         Ulnar Motor (Abd Dig Min)  31.1C  Wrist 2.8   10.2   B Elbow Wrist 56    B Elbow 7.3   6.6   A Elbow B Elbow 62    A Elbow 8.9   8.1            Waveforms:

## 2023-02-14 ENCOUNTER — Ambulatory Visit (INDEPENDENT_AMBULATORY_CARE_PROVIDER_SITE_OTHER): Payer: Medicare HMO | Admitting: Orthopedic Surgery

## 2023-02-14 DIAGNOSIS — R202 Paresthesia of skin: Secondary | ICD-10-CM

## 2023-02-15 ENCOUNTER — Encounter: Payer: Self-pay | Admitting: Orthopedic Surgery

## 2023-02-15 DIAGNOSIS — Z0001 Encounter for general adult medical examination with abnormal findings: Secondary | ICD-10-CM | POA: Diagnosis not present

## 2023-02-15 DIAGNOSIS — M5136 Other intervertebral disc degeneration, lumbar region: Secondary | ICD-10-CM | POA: Diagnosis not present

## 2023-02-15 DIAGNOSIS — Z1331 Encounter for screening for depression: Secondary | ICD-10-CM | POA: Diagnosis not present

## 2023-02-15 DIAGNOSIS — M5030 Other cervical disc degeneration, unspecified cervical region: Secondary | ICD-10-CM | POA: Diagnosis not present

## 2023-02-15 DIAGNOSIS — E6609 Other obesity due to excess calories: Secondary | ICD-10-CM | POA: Diagnosis not present

## 2023-02-15 DIAGNOSIS — G9332 Myalgic encephalomyelitis/chronic fatigue syndrome: Secondary | ICD-10-CM | POA: Diagnosis not present

## 2023-02-15 DIAGNOSIS — Z125 Encounter for screening for malignant neoplasm of prostate: Secondary | ICD-10-CM | POA: Diagnosis not present

## 2023-02-15 DIAGNOSIS — E559 Vitamin D deficiency, unspecified: Secondary | ICD-10-CM | POA: Diagnosis not present

## 2023-02-15 DIAGNOSIS — R790 Abnormal level of blood mineral: Secondary | ICD-10-CM | POA: Diagnosis not present

## 2023-02-15 DIAGNOSIS — M159 Polyosteoarthritis, unspecified: Secondary | ICD-10-CM | POA: Diagnosis not present

## 2023-02-15 DIAGNOSIS — G2581 Restless legs syndrome: Secondary | ICD-10-CM | POA: Diagnosis not present

## 2023-02-15 DIAGNOSIS — Z6832 Body mass index (BMI) 32.0-32.9, adult: Secondary | ICD-10-CM | POA: Diagnosis not present

## 2023-02-15 DIAGNOSIS — D518 Other vitamin B12 deficiency anemias: Secondary | ICD-10-CM | POA: Diagnosis not present

## 2023-02-15 DIAGNOSIS — E782 Mixed hyperlipidemia: Secondary | ICD-10-CM | POA: Diagnosis not present

## 2023-02-15 DIAGNOSIS — G894 Chronic pain syndrome: Secondary | ICD-10-CM | POA: Diagnosis not present

## 2023-02-15 NOTE — Progress Notes (Signed)
Post-Op Visit Note   Patient: Ryan Fuller           Date of Birth: [birth year]           MRN: [id number] Visit Date: 02/14/2023 PCP: [name], MD   Assessment & Plan:  Chief Complaint:  Chief Complaint  Patient presents with   Other     Review EMG/NCV   Visit Diagnoses:  1. Paresthesia of skin     Plan: [name] presents for follow-up with EMG nerve study on the left hand.  She has mild carpal tunnel syndrome which is improved compared to prior study.  CPM machine helped his shoulder.  He has a pulley system at home.  0 turn lawnmower has also helped his shoulder.  On exam he is got good grip strength and intact EPL FPL interosseous function.  Forward flexion and abduction both above 90 on the shoulder.  Strength is improving.  I do want [name] to be careful with lifting.  He will follow-up in 2 months for final check.  No intervention required for the hand at this time.  Follow-Up Instructions: No follow-ups on file.   Orders:  No orders of the defined types were placed in this encounter.  No orders of the defined types were placed in this encounter.   Imaging: No results found.  PMFS History: Patient Active Problem List   Diagnosis Date Noted   DDD (degenerative disc disease), cervical 02/04/2022   DDD (degenerative disc disease), lumbar 02/04/2022   Primary osteoarthritis of both hands 02/04/2022   Arthritis of both acromioclavicular joints 02/04/2022   GERD (gastroesophageal reflux disease) 09/02/2021   Sigmoid diverticulitis 10/01/2019   Family hx of colon cancer 10/10/2018   Rectal bleeding 10/10/2018   History of colonic polyps 10/10/2018   Gout 08/10/2017   Unspecified constipation 02/21/2013   Past Medical History:  Diagnosis Date   Dysphagia 08/10/2017   Gout    Gout 08/10/2017   Iritis    dx by Dr. Jorja Loa   Kidney stones     Family History  Problem Relation Age of Onset   Dementia Mother    Kidney cancer Father    Kidney failure  Brother    Congestive Heart Failure Brother    Healthy Brother    Heart Problems Son    Healthy Son    Healthy Daughter     Past Surgical History:  Procedure Laterality Date   BACK SURGERY     BIOPSY  11/13/2018   Procedure: BIOPSY;  Surgeon: Rogene Houston, MD;  [facility];  Service: Endoscopy;;  terminal ileum colon   COLONOSCOPY N/A 03/01/2013   Procedure: COLONOSCOPY;  Surgeon: Rogene Houston, MD;  [facility];  Service: Endoscopy;  Laterality: N/A;  100   COLONOSCOPY N/A 11/13/2018   Procedure: COLONOSCOPY;  Surgeon: Rogene Houston, MD;  [facility];  Service: Endoscopy;  Laterality: N/A;  1015am   EGD/ED     years ago   ESOPHAGEAL DILATION N/A 08/31/2017   Procedure: ESOPHAGEAL DILATION;  Surgeon: Rogene Houston, MD;  [facility];  Service: Endoscopy;  Laterality: N/A;   ESOPHAGOGASTRODUODENOSCOPY N/A 08/31/2017   Procedure: ESOPHAGOGASTRODUODENOSCOPY (EGD);  Surgeon: Rogene Houston, MD;  [facility];  Service: Endoscopy;  Laterality: N/A;  3:10   NECK SURGERY     x2   POLYPECTOMY  11/13/2018   Procedure: POLYPECTOMY;  Surgeon: Rogene Houston, MD;  [facility];  Service: Endoscopy;;  colon   Rotator cuff surgery Right    Social History   Occupational History   Not on file  Tobacco Use   Smoking status: Never    Passive exposure: Past   Smokeless tobacco: Never  Vaping Use   Vaping Use: Never used  Substance and Sexual Activity   Alcohol use: No   Drug use: No   Sexual activity: Not on file

## 2023-02-21 ENCOUNTER — Other Ambulatory Visit (INDEPENDENT_AMBULATORY_CARE_PROVIDER_SITE_OTHER): Payer: Self-pay | Admitting: Gastroenterology

## 2023-02-21 DIAGNOSIS — K219 Gastro-esophageal reflux disease without esophagitis: Secondary | ICD-10-CM

## 2023-02-28 ENCOUNTER — Other Ambulatory Visit (INDEPENDENT_AMBULATORY_CARE_PROVIDER_SITE_OTHER): Payer: Self-pay

## 2023-02-28 DIAGNOSIS — K219 Gastro-esophageal reflux disease without esophagitis: Secondary | ICD-10-CM

## 2023-02-28 MED ORDER — LANSOPRAZOLE 30 MG PO CPDR: DELAYED_RELEASE_CAPSULE | ORAL | 0 refills | Status: DC

## 2023-03-17 ENCOUNTER — Ambulatory Visit (INDEPENDENT_AMBULATORY_CARE_PROVIDER_SITE_OTHER): Payer: Medicare HMO | Admitting: Gastroenterology

## 2023-03-17 ENCOUNTER — Encounter (INDEPENDENT_AMBULATORY_CARE_PROVIDER_SITE_OTHER): Payer: Self-pay | Admitting: Gastroenterology

## 2023-03-17 VITALS — BP 154/94 | HR 96 | Temp 98.1°F | Ht 72.0 in | Wt 239.5 lb

## 2023-03-17 DIAGNOSIS — K219 Gastro-esophageal reflux disease without esophagitis: Secondary | ICD-10-CM

## 2023-03-17 MED ORDER — LANSOPRAZOLE 30 MG PO CPDR: 30.0000 mg | DELAYED_RELEASE_CAPSULE | Freq: Every day | ORAL | 3 refills | Status: DC

## 2023-03-17 NOTE — Patient Instructions (Addendum)
Please continue with prevacid 30mg  daily  Be mindful of greasy, spicy, fried, citrus foods, caffeine, carbonated drinks, chocolate and alcohol as these can increase reflux symptoms Stay upright 2-3 hours after eating, prior to lying down and avoid eating late in the evenings.  As discussed, be mindful of frequent/ongoing NSAID use (advil, aleve, naproxen, goody powder, ibuprofen, motrin, bc powder) as these can be very hard on your GI tract, causing inflammation, ulcers and damage to the lining of your GI tract. There was also concern for NSAID induced inflammation in your colon previously. I would recommend trying tylenol or speaking with your primary care doctor about other pain alternatives  You will be due for colonoscopy in December 2024, we will reach out closer to time to schedule this  Follow up 1 year  It was a pleasure to see you today. I want to create trusting relationships with patients and provide genuine, compassionate, and quality care. I truly value your feedback! please be on the lookout for a survey regarding your visit with me today. I appreciate your input about our visit and your time in completing this!    Whitney Bingaman L. Jeanmarie Hubert, MSN, APRN, AGNP-C Adult-Gerontology Nurse Practitioner [facility]

## 2023-03-17 NOTE — Progress Notes (Addendum)
Referring Provider: Assunta Found, MD Primary Care Physician:  Assunta Found, MD Primary GI Physician: Levon Hedger   Chief Complaint  Patient presents with   Gastroesophageal Reflux    Pt arrives for refills. No issues/concerns at this time   HPI:   Bright V Deetz is a 57 y.o. male with past medical history of  GERD, kidney stones, multiple cervical surgeries   Patient presenting today for follow up of GERD.   Last seen October 2022, at that time reported GERD since he was 57 years old, taking antacids for management but on PPI  for multiple years with good control of his symptoms.   Recommended to decrease lansoprazole to 15mg  daily, increase back to 30mg  daily if heartburn recurs. Patient called in January 2023 requesting refill of lansoprazole 30mg  as he had had to increase back to higher dose.  Present:  States he took 15mg  lansoprazole previously as instructed and had indigestion, started back on 30mg  and doing well with no breakthrough symptoms. No weight loss or changes in appetite. No constipation or diarrhea. No rectal bleeding or melena. No dysphagia or odynophagia. No nausea or vomiting   He has a lot of shoulder pain and takes four 200mg  motrin nightly recently with his tramadol. Recently with more Left hand stiffness/pain/swelling. He is seeing PCP soon.   He inquired about his previous colonoscopy results which I discussed with him in detail.   Barium esophagram 2020 -   prominent cricopharyngeus but barium pill but barium pill passed from oral cavity stomach without any delay Last EGD:  October 2018 when he was noted to have small sliding hiatal hernia otherwise normal exam and his esophagus was dilated by passing 54 and 56 [place]oney dilator.  Last Colonoscopy: December 2019 - he has history of colonic polyps.  He had 2 small tubular adenomas removed.  He had 2 also the transverse colon and biopsy revealed nonspecific inflammation most likely due to NSAIDs which were  discontinued.  He also had scattered diverticula at splenic flexure and sigmoid colon. Recommended to repeat in 2024.   Past Medical History:  Diagnosis Date   Dysphagia 08/10/2017   Gout    Gout 08/10/2017   Iritis    dx by Dr. Charise Killian   Kidney stones     Past Surgical History:  Procedure Laterality Date   BACK SURGERY     BIOPSY  11/13/2018   Procedure: BIOPSY;  Surgeon: Malissa Hippo, MD;  [facility];  Service: Endoscopy;;  terminal ileum colon   COLONOSCOPY N/A 03/01/2013   Procedure: COLONOSCOPY;  Surgeon: Malissa Hippo, MD;  [facility];  Service: Endoscopy;  Laterality: N/A;  100   COLONOSCOPY N/A 11/13/2018   Procedure: COLONOSCOPY;  Surgeon: Malissa Hippo, MD;  [facility];  Service: Endoscopy;  Laterality: N/A;  1015am   EGD/ED     years ago   ESOPHAGEAL DILATION N/A 08/31/2017   Procedure: ESOPHAGEAL DILATION;  Surgeon: Malissa Hippo, MD;  [facility];  Service: Endoscopy;  Laterality: N/A;   ESOPHAGOGASTRODUODENOSCOPY N/A 08/31/2017   Procedure: ESOPHAGOGASTRODUODENOSCOPY (EGD);  Surgeon: Malissa Hippo, MD;  [facility];  Service: Endoscopy;  Laterality: N/A;  3:10   NECK SURGERY     x2   POLYPECTOMY  11/13/2018   Procedure: POLYPECTOMY;  Surgeon: Malissa Hippo, MD;  [facility];  Service: Endoscopy;;  colon   Rotator cuff surgery Right  Current Outpatient Medications  Medication Sig Dispense Refill   allopurinol (ZYLOPRIM) 100 MG tablet Take 100 mg by mouth 2 (two) times daily.      ibuprofen (ADVIL) 200 MG tablet Take 400-600 mg by mouth every 8 (eight) hours as needed (pain.).     lansoprazole (PREVACID) 30 MG capsule TAKE 1 CAPSULE ONCE DAILY AT 12 NOON. 30 capsule 0   polyethylene glycol (MIRALAX / GLYCOLAX) packet Take 34 g by mouth daily. w/Coffee     Psyllium (METAMUCIL FREE & NATURAL PO) Take 2 scoop by mouth at bedtime.     rOPINIRole (REQUIP) 1 MG tablet Take 1  tablet by mouth at bedtime.     traMADol (ULTRAM) 50 MG tablet Take 100 mg by mouth in the morning, at noon, and at bedtime.      No current facility-administered medications for this visit.    Allergies as of 03/17/2023 - Review Complete 03/17/2023  Allergen Reaction Noted   Phenergan [promethazine hcl] Anaphylaxis and Other (See Comments) 02/20/2013   Colchicine Other (See Comments) 02/20/2013   Penicillins Other (See Comments) 09/25/2014   Vancomycin Swelling and Other (See Comments) 05/27/2016    Family History  Problem Relation Age of Onset   Dementia Mother    Kidney cancer Father    Kidney failure Brother    Congestive Heart Failure Brother    Healthy Brother    Heart Problems Son    Healthy Son    Healthy Daughter     Social History   Socioeconomic History   Marital status: Married    Spouse name: Not on file   Number of children: Not on file   Years of education: Not on file   Highest education level: Not on file  Occupational History   Not on file  Tobacco Use   Smoking status: Never    Passive exposure: Past   Smokeless tobacco: Never  Vaping Use   Vaping Use: Never used  Substance and Sexual Activity   Alcohol use: No   Drug use: No   Sexual activity: Not on file  Other Topics Concern   Not on file  Social History Narrative   Not on file   Social Determinants of Health   Financial Resource Strain: Not on file  Food Insecurity: Not on file  Transportation Needs: Not on file  Physical Activity: Not on file  Stress: Not on file  Social Connections: Not on file   Review of systems General: negative for malaise, night sweats, fever, chills, weight loss Neck: Negative for lumps, goiter, pain and significant neck swelling Resp: Negative for cough, wheezing, dyspnea at rest CV: Negative for chest pain, leg swelling, palpitations, orthopnea GI: denies melena, hematochezia, nausea, vomiting, diarrhea, constipation, dysphagia, odyonophagia, early  satiety or unintentional weight loss.  MSK: Negative for back pain, and muscle pain. +swelling/stiffness to Left hand  Derm: Negative for itching or rash Psych: Denies depression, anxiety, memory loss, confusion. No homicidal or suicidal ideation.  Heme: Negative for prolonged bleeding, bruising easily, and swollen nodes. Endocrine: Negative for cold or heat intolerance, polyuria, polydipsia and goiter. Neuro: negative for tremor, gait imbalance, syncope and seizures. The remainder of the review of systems is noncontributory.  Physical Exam: BP (!) 154/94   Pulse 96   Temp 98.1 F (36.7 C)   Ht 6' (1.829 m)   Wt 239 lb 8 oz (108.6 kg)   BMI 32.48 kg/m  General:   Alert and oriented. No distress noted. Pleasant and cooperative.  Head:  Normocephalic and atraumatic. Eyes:  Conjuctiva clear without scleral icterus. Mouth:  Oral mucosa pink and moist. Good dentition. No lesions. Heart: Normal rate and rhythm, s1 and s2 heart sounds present.  Lungs: Clear lung sounds in all lobes. Respirations equal and unlabored. Abdomen:  +BS, soft, non-tender and non-distended. No rebound or guarding. No HSM or masses noted. Derm: No palmar erythema or jaundice Msk:  swelling noted to left hand Extremities:  Without edema. Neurologic:  Alert and  oriented x4 Psych:  Alert and cooperative. Normal mood and affect.  Invalid input(s): "6 MONTHS"   ASSESSMENT: Chaney V Luis is a 57 y.o. male presenting today for follow up of GERD.  GERD: well managed on lansoprazole 30mg  daily, decreased to 15 mg daily in the past with breakthrough symptoms so he went back to 30mg  daily dosing and is doing well with this. He has no GI symptoms today. Will continue with lansoprazole 30mg  daily and good reflux precautions.  He inquired about previous colonoscopy results which I discussed in depth with him. He is still taking frequent ibuprofen for chronic shoulder and now some hand pain. He also takes tramadol. I  encouraged him to limit NSAIDs especially in presence of concern for NSAID induced inflammation in his colon on previous exam. He can try tylenol and should discuss other pain management options with his PCP.  The patient was found to have elevated blood pressure when vital signs were checked in the office. The blood pressure was rechecked by the nursing staff and it was found be persistently elevated >140/90 mmHg. I personally advised to the patient to follow up closely with PCP for hypertension control. He has follow up with his PCP regarding this coming up.     PLAN:  Continue with prevacid 30mg  daily, reflux precautions   2. Repeat TCS December 2024 (confirm on recall list) 3. Try to limit NSAID use, discuss other pain options with PCP  4. Follow up with PCP regarding HTN.  All questions were answered, patient verbalized understanding and is in agreement with plan as outlined above.    Follow Up: 1 year   Kylin Genna L. Jeanmarie Hubert, MSN, APRN, AGNP-C Adult-Gerontology Nurse Practitioner [facility]  I have reviewed the note and agree with the APP's assessment as described in this progress note  Katrinka Blazing, MD Gastroenterology and Hepatology [facility]

## 2023-03-23 ENCOUNTER — Other Ambulatory Visit (INDEPENDENT_AMBULATORY_CARE_PROVIDER_SITE_OTHER): Payer: Medicare HMO

## 2023-03-23 ENCOUNTER — Encounter: Payer: Self-pay | Admitting: Orthopedic Surgery

## 2023-03-23 ENCOUNTER — Ambulatory Visit: Payer: Medicare HMO | Admitting: Orthopedic Surgery

## 2023-03-23 DIAGNOSIS — M79642 Pain in left hand: Secondary | ICD-10-CM

## 2023-03-23 DIAGNOSIS — M79641 Pain in right hand: Secondary | ICD-10-CM

## 2023-03-23 MED ORDER — MELOXICAM 15 MG PO TABS
ORAL_TABLET | ORAL | 0 refills | Status: DC
Start: 2023-03-23 — End: 2023-05-10

## 2023-03-23 NOTE — Progress Notes (Signed)
Office Visit Note   Patient: Ryan Fuller           Date of Birth: [birth year]           MRN: [id number] Visit Date: 03/23/2023 Requested by: Assunta Found, MD [address] [place],  [place] [address] PCP: Assunta Found, MD  Subjective: Chief Complaint  Patient presents with   Other     Bilateral hand pain    HPI: Ryan Fuller is a 57 y.o. male who presents to the office reporting bilateral hand pain and swelling left worse than right.  Generally unable to make a fist on the left-hand side.  Hand is very stiff.  Reports decreased strength and decreased grip and he is dropping things in that left hand.  Does have a history of gout.  Allopurinol dose recommended by rheumatology to be increased to 2 times a day.  There is a family history of rheumatoid arthritis.  Had a C-spine MRI from 1223.  Prior fusion in the neck has been done a long time ago.  There is some adjacent segment disease.  He is having some left neck pain and left arm pain consistent with radiculopathy.  He also has restless leg syndrome.  Takes Requip but that may be increasing his blood pressure..                ROS: All systems reviewed are negative as they relate to the chief complaint within the history of present illness.  Patient denies fevers or chills.  Assessment & Plan: Visit Diagnoses:  1. Bilateral hand pain     Plan: Impression is left hand swelling and possible radiculopathy affecting the left arm.  Has had injections in the past but does not really want to try those.  I think following through on the rheumatology recommendation to increase the allopurinol could make sense if this swelling was related to subclinical gout affecting the joints.  Mobic once every 3 days I think could be helpful.  Compression glove for the left hand may also be helpful to decrease some of the swelling in the hand.  Follow-up as needed.  Follow-Up Instructions: No follow-ups on file.   Orders:  Orders Placed This  Encounter  Procedures   XR Hand Complete Right   XR Hand Complete Left   Meds ordered this encounter  Medications   meloxicam (MOBIC) 15 MG tablet    Sig: Take one tablet by mouth every 3 days as needed for pain/swelling    Dispense:  30 tablet    Refill:  0      Procedures: No procedures performed   Clinical Data: No additional findings.  Objective: Vital Signs: There were no vitals taken for this visit.  Physical Exam:  Constitutional: Patient appears well-developed HEENT:  Head: Normocephalic Eyes:EOM are normal Neck: Normal range of motion Cardiovascular: Normal rate Pulmonary/chest: Effort normal Neurologic: Patient is alert Skin: Skin is warm Psychiatric: Patient has normal mood and affect  Ortho Exam: Ortho exam demonstrates decreased grip strength on the left compared to the right.  EPL FPL interosseous strength is intact.  Wrist range of motion is full.  There is some mild swelling in the digits of the left hand.  I am able to get each DIP phalangeal joint to the distal palmar flexion crease.  No real synovitis or warmth to any of the joints in either hand or wrist.  Neck range of motion is unchanged.  Motor or sensory strength otherwise intact in  both arms.  Specialty Comments:  03/17/2022 EMG/NCS Impression: The above electrodiagnostic study is ABNORMAL and reveals evidence of a moderate Bilateral median nerve entrapment at the wrist (carpal tunnel syndrome) affecting sensory and motor components.    There is no significant electrodiagnostic evidence of any other focal nerve entrapment, brachial plexopathy or cervical radiculopathy.    Recommendations: 1.  Follow-up with referring physician. 2.  Continue current management of symptoms. 3.  Continue use of resting splint at night-time and as needed during the day. 4.  Suggest surgical evaluation.  Elisabeth Most, MD ------- MRI CERVICAL SPINE WITHOUT CONTRAST   TECHNIQUE: Multiplanar, multisequence MR  imaging of the cervical spine was performed. No intravenous contrast was administered.   COMPARISON:  Previous MRI from 10/16/2021.   FINDINGS: Alignment: Straightening of the normal cervical lordosis. No significant listhesis.   Vertebrae: Postoperative changes from prior fusion at C4 through C7, with anterior plate screw fixation in place at C6-7. Vertebral body height maintained without acute or chronic fracture. Bone marrow signal intensity heterogeneous but overall within normal limits. No worrisome osseous lesions. No abnormal marrow edema.   Cord: Normal signal and morphology.   Posterior Fossa, vertebral arteries, paraspinal tissues: Visualized brain and posterior fossa within normal limits. Craniocervical junction normal. Mild soft tissue edema adjacent to the C7 spinous process, most commonly seen with bursitis (series 7, image 10). Paraspinous soft tissues demonstrate no other acute finding. Normal flow voids seen within the vertebral arteries bilaterally.   Disc levels:   C2-C3: Normal interspace. Mild bilateral facet hypertrophy. Small extraforaminal synovial cyst again noted on the left, stable (series 8, image 6). No spinal stenosis. Foramina remain patent.   C3-C4: Mild disc bulge with uncovertebral spurring. Mild bilateral facet and ligament flavum hypertrophy. No spinal stenosis. Mild bilateral foraminal narrowing, stable.   C4-C5: Prior fusion. No residual spinal stenosis. Foramina remain patent.   C5-C6: Prior fusion. No residual spinal stenosis. Right worse than left uncovertebral and facet hypertrophy with residual moderate right C6 foraminal narrowing. Left neural foramen remains patent.   C6-C7: Prior fusion. No residual spinal stenosis. Uncovertebral spurring with residual mild left C7 foraminal stenosis. Right neural foramen remains patent.   C7-T1: Small central to right paracentral disc protrusion with annular fissure indents the ventral  thecal sac (series 8, image 33). Mild facet hypertrophy. No significant spinal stenosis. Foramina remain patent.   Note made of mild noncompressive disc bulging at the T2-3 level without stenosis.   IMPRESSION: 1. Prior fusion at C4 through C7 without residual spinal stenosis. Residual moderate right C6 and mild left C7 foraminal narrowing related to uncovertebral and facet disease. Overall appearance is stable. 2. Small central to right paracentral disc protrusion at C7-T1 without significant stenosis. This is slightly decreased in size from prior. 3. Mild bilateral foraminal narrowing at C3-4 related to disc bulge and uncovertebral disease, stable. 4. Mild soft tissue edema about the C7 spinous process, most commonly seen in the setting of bursitis.     Electronically Signed   By: Rise Mu M.D.   On: 11/08/2022 02:24  Imaging: XR Hand Complete Right  Result Date: 03/23/2023 Multiple radiographic views right hand reviewed.  No acute fracture or dislocation.  Mild degenerative changes noted in the phalangeal joints particularly of the PIP of the index and middle finger.  No significant erosions in the radiocarpal or intercarpal joints.  XR Hand Complete Left  Result Date: 03/23/2023 Multiple radiographic views of the left hand are reviewed.  No  acute fracture.  Minimal degenerative joint disease in the phalangeal joints as well as the radiocarpal and intercarpal spaces.  No significant erosions noted.    PMFS History: Patient Active Problem List   Diagnosis Date Noted   DDD (degenerative disc disease), cervical 02/04/2022   DDD (degenerative disc disease), lumbar 02/04/2022   Primary osteoarthritis of both hands 02/04/2022   Arthritis of both acromioclavicular joints 02/04/2022   GERD (gastroesophageal reflux disease) 09/02/2021   Sigmoid diverticulitis 10/01/2019   Family hx of colon cancer 10/10/2018   Rectal bleeding 10/10/2018   History of colonic polyps  10/10/2018   Gout 08/10/2017   Unspecified constipation 02/21/2013   Past Medical History:  Diagnosis Date   Dysphagia 08/10/2017   Gout    Gout 08/10/2017   Iritis    dx by Dr. Charise Killian   Kidney stones     Family History  Problem Relation Age of Onset   Dementia Mother    Kidney cancer Father    Kidney failure Brother    Congestive Heart Failure Brother    Healthy Brother    Heart Problems Son    Healthy Son    Healthy Daughter     Past Surgical History:  Procedure Laterality Date   BACK SURGERY     BIOPSY  11/13/2018   Procedure: BIOPSY;  Surgeon: Malissa Hippo, MD;  [facility];  Service: Endoscopy;;  terminal ileum colon   COLONOSCOPY N/A 03/01/2013   Procedure: COLONOSCOPY;  Surgeon: Malissa Hippo, MD;  [facility];  Service: Endoscopy;  Laterality: N/A;  100   COLONOSCOPY N/A 11/13/2018   Procedure: COLONOSCOPY;  Surgeon: Malissa Hippo, MD;  [facility];  Service: Endoscopy;  Laterality: N/A;  1015am   EGD/ED     years ago   ESOPHAGEAL DILATION N/A 08/31/2017   Procedure: ESOPHAGEAL DILATION;  Surgeon: Malissa Hippo, MD;  [facility];  Service: Endoscopy;  Laterality: N/A;   ESOPHAGOGASTRODUODENOSCOPY N/A 08/31/2017   Procedure: ESOPHAGOGASTRODUODENOSCOPY (EGD);  Surgeon: Malissa Hippo, MD;  [facility];  Service: Endoscopy;  Laterality: N/A;  3:10   NECK SURGERY     x2   POLYPECTOMY  11/13/2018   Procedure: POLYPECTOMY;  Surgeon: Malissa Hippo, MD;  [facility];  Service: Endoscopy;;  colon   Rotator cuff surgery Right    Social History   Occupational History   Not on file  Tobacco Use   Smoking status: Never    Passive exposure: Past   Smokeless tobacco: Never  Vaping Use   Vaping Use: Never used  Substance and Sexual Activity   Alcohol use: No   Drug use: No   Sexual activity: Not on file

## 2023-03-28 ENCOUNTER — Ambulatory Visit (INDEPENDENT_AMBULATORY_CARE_PROVIDER_SITE_OTHER): Payer: Medicare HMO | Admitting: Gastroenterology

## 2023-03-28 DIAGNOSIS — H2513 Age-related nuclear cataract, bilateral: Secondary | ICD-10-CM | POA: Diagnosis not present

## 2023-03-28 DIAGNOSIS — H43811 Vitreous degeneration, right eye: Secondary | ICD-10-CM | POA: Diagnosis not present

## 2023-03-31 DIAGNOSIS — M5136 Other intervertebral disc degeneration, lumbar region: Secondary | ICD-10-CM | POA: Diagnosis not present

## 2023-03-31 DIAGNOSIS — E6609 Other obesity due to excess calories: Secondary | ICD-10-CM | POA: Diagnosis not present

## 2023-03-31 DIAGNOSIS — M159 Polyosteoarthritis, unspecified: Secondary | ICD-10-CM | POA: Diagnosis not present

## 2023-03-31 DIAGNOSIS — M5030 Other cervical disc degeneration, unspecified cervical region: Secondary | ICD-10-CM | POA: Diagnosis not present

## 2023-03-31 DIAGNOSIS — G894 Chronic pain syndrome: Secondary | ICD-10-CM | POA: Diagnosis not present

## 2023-03-31 DIAGNOSIS — T50905A Adverse effect of unspecified drugs, medicaments and biological substances, initial encounter: Secondary | ICD-10-CM | POA: Diagnosis not present

## 2023-03-31 DIAGNOSIS — G2581 Restless legs syndrome: Secondary | ICD-10-CM | POA: Diagnosis not present

## 2023-03-31 DIAGNOSIS — Z6832 Body mass index (BMI) 32.0-32.9, adult: Secondary | ICD-10-CM | POA: Diagnosis not present

## 2023-04-13 ENCOUNTER — Ambulatory Visit: Payer: Medicare HMO | Admitting: Orthopedic Surgery

## 2023-05-10 ENCOUNTER — Encounter (INDEPENDENT_AMBULATORY_CARE_PROVIDER_SITE_OTHER): Payer: Self-pay

## 2023-05-10 ENCOUNTER — Encounter (INDEPENDENT_AMBULATORY_CARE_PROVIDER_SITE_OTHER): Payer: Self-pay | Admitting: Gastroenterology

## 2023-05-10 ENCOUNTER — Ambulatory Visit (INDEPENDENT_AMBULATORY_CARE_PROVIDER_SITE_OTHER): Payer: Medicare HMO | Admitting: Gastroenterology

## 2023-05-10 VITALS — BP 147/98 | HR 79 | Temp 97.7°F | Ht 72.0 in | Wt 245.6 lb

## 2023-05-10 DIAGNOSIS — K219 Gastro-esophageal reflux disease without esophagitis: Secondary | ICD-10-CM | POA: Diagnosis not present

## 2023-05-10 DIAGNOSIS — R1310 Dysphagia, unspecified: Secondary | ICD-10-CM

## 2023-05-10 MED ORDER — SUCRALFATE 1 G PO TABS: 1.0000 g | ORAL_TABLET | Freq: Three times a day (TID) | ORAL | 0 refills | Status: DC

## 2023-05-10 MED ORDER — OMEPRAZOLE 40 MG PO CPDR: 40.0000 mg | DELAYED_RELEASE_CAPSULE | Freq: Every day | ORAL | 2 refills | Status: DC

## 2023-05-10 NOTE — Patient Instructions (Signed)
Please stop prevacid Start omeprazole 40mg  daily I have sent carafate 1g for you to take 4 times a day, before meals and at bedtime Please dissolve tablet in 1/2 oz of water, mix and drink, this works better to coat the GI tract and help with discomfort We will schedule you for upper endoscopy for further evaluation  Follow up 4 months  It was a pleasure to see you today. I want to create trusting relationships with patients and provide genuine, compassionate, and quality care. I truly value your feedback! please be on the lookout for a survey regarding your visit with me today. I appreciate your input about our visit and your time in completing this!    Theora Vankirk L. Jeanmarie Hubert, MSN, APRN, AGNP-C Adult-Gerontology Nurse Practitioner [facility] Gastroenterology at [facility]

## 2023-05-10 NOTE — H&P (View-Only) (Signed)
 Referring Provider: Golding, John, MD Primary Care Physician:  Golding, John, MD Primary GI Physician: Castaneda   Chief Complaint  Patient presents with   Dysphagia    Having some trouble swallowing. Has trouble with liquids and solids.    HPI:   Ryan Fuller is a 56 y.o. male with past medical history of GERD, kidney stones, multiple cervical surgeries   Patient presenting today for follow up of GERD/odynophagia/Dysphagia  Last seen may 2024, at that time recently started back on lansoprazole 30mg daily, doing well.   Recommended continue with prevacid 30mg daily, repeat TCS December 2023, limit NSAIDs.  Present:  Patient notes he has some pain/burning in the back of his throat/sternal notch. He denies actual heartburn. Notes he can bend over and feel food regurgitate back up. Took some mylanta a few nights ago which seemed to soothe his discomfort. Has some issues with foods/liquids sometimes not wanting to go down. He recently stopped requip and started on mirapex, no other med changes, antibiotics/oral steroids. He notes that he has a dry cough, sometimes a sore throat. He has been on prevacid for a number of years. Notes he had two neck surgeries and feels swallowing has been off since then.  Denies nausea, vomiting, weight loss, rectal bleeding or melena.    Barium esophagram 2020 -   prominent cricopharyngeus but barium pill but barium pill passed from oral cavity stomach without any delay Last EGD:  October 2018 when he was noted to have small sliding hiatal hernia otherwise normal exam and his esophagus was dilated by passing 54 and 56 [place] Maloney dilator.  Last Colonoscopy: December 2019 - he has history of colonic polyps.  He had 2 small tubular adenomas removed.  He had 2 also the transverse colon and biopsy revealed nonspecific inflammation most likely due to NSAIDs which were discontinued.  He also had scattered diverticula at splenic flexure and sigmoid colon.  Recommended to repeat in 2024.  Recommendations:    Past Medical History:  Diagnosis Date   Dysphagia 08/10/2017   Gout    Gout 08/10/2017   Iritis    dx by Dr. Cotter   Kidney stones     Past Surgical History:  Procedure Laterality Date   BACK SURGERY     BIOPSY  11/13/2018   Procedure: BIOPSY;  Surgeon: Rehman, Najeeb U, MD;  [facility];  Service: Endoscopy;;  terminal ileum colon   COLONOSCOPY N/A 03/01/2013   Procedure: COLONOSCOPY;  Surgeon: Najeeb U Rehman, MD;  [facility];  Service: Endoscopy;  Laterality: N/A;  100   COLONOSCOPY N/A 11/13/2018   Procedure: COLONOSCOPY;  Surgeon: Rehman, Najeeb U, MD;  [facility];  Service: Endoscopy;  Laterality: N/A;  1015am   EGD/ED     years ago   ESOPHAGEAL DILATION N/A 08/31/2017   Procedure: ESOPHAGEAL DILATION;  Surgeon: Rehman, Najeeb U, MD;  [facility];  Service: Endoscopy;  Laterality: N/A;   ESOPHAGOGASTRODUODENOSCOPY N/A 08/31/2017   Procedure: ESOPHAGOGASTRODUODENOSCOPY (EGD);  Surgeon: Rehman, Najeeb U, MD;  [facility];  Service: Endoscopy;  Laterality: N/A;  3:10   NECK SURGERY     x2   POLYPECTOMY  11/13/2018   Procedure: POLYPECTOMY;  Surgeon: Rehman, Najeeb U, MD;  [facility];  Service: Endoscopy;;  colon   Rotator cuff surgery Right     Current Outpatient Medications  Medication Sig Dispense Refill   allopurinol (ZYLOPRIM) 100 MG tablet Take   100 mg by mouth 2 (two) times daily.      ibuprofen (ADVIL) 200 MG tablet Take 400-600 mg by mouth every 8 (eight) hours as needed (pain.).     lansoprazole (PREVACID) 30 MG capsule Take 1 capsule (30 mg total) by mouth daily. TAKE 1 CAPSULE ONCE DAILY AT 12 NOON. 90 capsule 3   polyethylene glycol (MIRALAX / GLYCOLAX) packet Take 34 g by mouth daily. w/Coffee     pramipexole (MIRAPEX) 1 MG tablet Take 1 mg by mouth daily.     Psyllium (METAMUCIL FREE & NATURAL PO) Take 2 scoop by mouth at bedtime.      traMADol (ULTRAM) 50 MG tablet Take 100 mg by mouth in the morning, at noon, and at bedtime.      No current facility-administered medications for this visit.    Allergies as of 05/10/2023 - Review Complete 05/10/2023  Allergen Reaction Noted   Phenergan [promethazine hcl] Anaphylaxis and Other (See Comments) 02/20/2013   Colchicine Other (See Comments) 02/20/2013   Penicillins Other (See Comments) 09/25/2014   Vancomycin Swelling and Other (See Comments) 05/27/2016    Family History  Problem Relation Age of Onset   Dementia Mother    Kidney cancer Father    Kidney failure Brother    Congestive Heart Failure Brother    Healthy Brother    Heart Problems Son    Healthy Son    Healthy Daughter     Social History   Socioeconomic History   Marital status: Married    Spouse name: Not on file   Number of children: Not on file   Years of education: Not on file   Highest education level: Not on file  Occupational History   Not on file  Tobacco Use   Smoking status: Never    Passive exposure: Past   Smokeless tobacco: Never  Vaping Use   Vaping Use: Never used  Substance and Sexual Activity   Alcohol use: No   Drug use: No   Sexual activity: Not on file  Other Topics Concern   Not on file  Social History Narrative   Not on file   Social Determinants of Health   Financial Resource Strain: Not on file  Food Insecurity: Not on file  Transportation Needs: Not on file  Physical Activity: Not on file  Stress: Not on file  Social Connections: Not on file   Review of systems General: negative for malaise, night sweats, fever, chills, weight loss Neck: Negative for lumps, goiter, pain and significant neck swelling Resp: Negative for cough, wheezing, dyspnea at rest CV: Negative for chest pain, leg swelling, palpitations, orthopnea GI: denies melena, hematochezia, nausea, vomiting, diarrhea, constipation, early satiety or unintentional weight loss. +dysphagia  +odynophagia  MSK: Negative for joint pain or swelling, back pain, and muscle pain. Derm: Negative for itching or rash Psych: Denies depression, anxiety, memory loss, confusion. No homicidal or suicidal ideation.  Heme: Negative for prolonged bleeding, bruising easily, and swollen nodes. Endocrine: Negative for cold or heat intolerance, polyuria, polydipsia and goiter. Neuro: negative for tremor, gait imbalance, syncope and seizures. The remainder of the review of systems is noncontributory.  Physical Exam: BP (!) 147/98   Pulse 79   Temp 97.7 F (36.5 C) (Oral)   Ht 6' (1.829 m)   Wt 245 lb 9.6 oz (111.4 kg)   BMI 33.31 kg/m  General:   Alert and oriented. No distress noted. Pleasant and cooperative.  Head:  Normocephalic and atraumatic. Eyes:    Conjuctiva clear without scleral icterus. Mouth:  Oral mucosa pink and moist. Good dentition. No lesions. Heart: Normal rate and rhythm, s1 and s2 heart sounds present.  Lungs: Clear lung sounds in all lobes. Respirations equal and unlabored. Abdomen:  +BS, soft, non-tender and non-distended. No rebound or guarding. No HSM or masses noted. Derm: No palmar erythema or jaundice Msk:  Symmetrical without gross deformities. Normal posture. Extremities:  Without edema. Neurologic:  Alert and  oriented x4 Psych:  Alert and cooperative. Normal mood and affect.  Invalid input(s): "6 MONTHS"   ASSESSMENT: Ryan Fuller is a 56 y.o. male presenting today for dysphagia/odynophagia  Presenting today with burning and pain in his throat with swallowing as well as some dysphagia.  Having some food regurgitation at times when he bends over, however no notable heartburn.  No recent antibiotics or steroids.  He notes Mylanta helps to relieve his symptoms.  He has been on Prevacid for some time, will stop this and start omeprazole 40 mg once daily.  Will also send course of Carafate 1 g 4 times daily for symptom relief.  Differentials include uncontrolled  GERD, esophagitis, esophageal stricture, ring, web, stenosis.  Recommend proceeding with upper endoscopy +/- dilation for further evaluation of patient's symptoms. Indications, risks and benefits of procedure discussed in detail with patient. Patient verbalized understanding and is in agreement to proceed with EGD.   PLAN:  Schedule EGD +/- dilation ASA II 2. Stop prevacid, start Omeprazole 40mg daily  3. Good reflux precautions 4. Carafte 1g QID, dissolve tablet in 1/2 oz water, mix and drink  All questions were answered, patient verbalized understanding and is in agreement with plan as outlined above.   Follow Up: 4 months   Lurdes Haltiwanger L. Gracianna Vink, MSN, APRN, AGNP-C Adult-Gerontology Nurse Practitioner [facility]  I have reviewed the note and agree with the APP's assessment as described in this progress note  Daniel Castaneda, MD Gastroenterology and Hepatology [facility]  

## 2023-05-10 NOTE — Progress Notes (Addendum)
Referring Provider: Assunta Found, MD Primary Care Physician:  Assunta Found, MD Primary GI Physician: Levon Hedger   Chief Complaint  Patient presents with   Dysphagia    Having some trouble swallowing. Has trouble with liquids and solids.    HPI:   Ryan Fuller is a 57 y.o. male with past medical history of GERD, kidney stones, multiple cervical surgeries   Patient presenting today for follow up of GERD/odynophagia/Dysphagia  Last seen may 2024, at that time recently started back on lansoprazole 30mg  daily, doing well.   Recommended continue with prevacid 30mg  daily, repeat TCS December 2023, limit NSAIDs.  Present:  Patient notes he has some pain/burning in the back of his throat/sternal notch. He denies actual heartburn. Notes he can bend over and feel food regurgitate back up. Took some mylanta a few nights ago which seemed to soothe his discomfort. Has some issues with foods/liquids sometimes not wanting to go down. He recently stopped requip and started on mirapex, no other med changes, antibiotics/oral steroids. He notes that he has a dry cough, sometimes a sore throat. He has been on prevacid for a number of years. Notes he had two neck surgeries and feels swallowing has been off since then.  Denies nausea, vomiting, weight loss, rectal bleeding or melena.    Barium esophagram 2020 -   prominent cricopharyngeus but barium pill but barium pill passed from oral cavity stomach without any delay Last EGD:  October 2018 when he was noted to have small sliding hiatal hernia otherwise normal exam and his esophagus was dilated by passing 54 and 56 [place] Maloney dilator.  Last Colonoscopy: December 2019 - he has history of colonic polyps.  He had 2 small tubular adenomas removed.  He had 2 also the transverse colon and biopsy revealed nonspecific inflammation most likely due to NSAIDs which were discontinued.  He also had scattered diverticula at splenic flexure and sigmoid colon.  Recommended to repeat in 2024.  Recommendations:    Past Medical History:  Diagnosis Date   Dysphagia 08/10/2017   Gout    Gout 08/10/2017   Iritis    dx by Dr. Charise Killian   Kidney stones     Past Surgical History:  Procedure Laterality Date   BACK SURGERY     BIOPSY  11/13/2018   Procedure: BIOPSY;  Surgeon: Malissa Hippo, MD;  [facility];  Service: Endoscopy;;  terminal ileum colon   COLONOSCOPY N/A 03/01/2013   Procedure: COLONOSCOPY;  Surgeon: Malissa Hippo, MD;  [facility];  Service: Endoscopy;  Laterality: N/A;  100   COLONOSCOPY N/A 11/13/2018   Procedure: COLONOSCOPY;  Surgeon: Malissa Hippo, MD;  [facility];  Service: Endoscopy;  Laterality: N/A;  1015am   EGD/ED     years ago   ESOPHAGEAL DILATION N/A 08/31/2017   Procedure: ESOPHAGEAL DILATION;  Surgeon: Malissa Hippo, MD;  [facility];  Service: Endoscopy;  Laterality: N/A;   ESOPHAGOGASTRODUODENOSCOPY N/A 08/31/2017   Procedure: ESOPHAGOGASTRODUODENOSCOPY (EGD);  Surgeon: Malissa Hippo, MD;  [facility];  Service: Endoscopy;  Laterality: N/A;  3:10   NECK SURGERY     x2   POLYPECTOMY  11/13/2018   Procedure: POLYPECTOMY;  Surgeon: Malissa Hippo, MD;  [facility];  Service: Endoscopy;;  colon   Rotator cuff surgery Right     Current Outpatient Medications  Medication Sig Dispense Refill   allopurinol (ZYLOPRIM) 100 MG tablet Take  100 mg by mouth 2 (two) times daily.      ibuprofen (ADVIL) 200 MG tablet Take 400-600 mg by mouth every 8 (eight) hours as needed (pain.).     lansoprazole (PREVACID) 30 MG capsule Take 1 capsule (30 mg total) by mouth daily. TAKE 1 CAPSULE ONCE DAILY AT 12 NOON. 90 capsule 3   polyethylene glycol (MIRALAX / GLYCOLAX) packet Take 34 g by mouth daily. w/Coffee     pramipexole (MIRAPEX) 1 MG tablet Take 1 mg by mouth daily.     Psyllium (METAMUCIL FREE & NATURAL PO) Take 2 scoop by mouth at bedtime.      traMADol (ULTRAM) 50 MG tablet Take 100 mg by mouth in the morning, at noon, and at bedtime.      No current facility-administered medications for this visit.    Allergies as of 05/10/2023 - Review Complete 05/10/2023  Allergen Reaction Noted   Phenergan [promethazine hcl] Anaphylaxis and Other (See Comments) 02/20/2013   Colchicine Other (See Comments) 02/20/2013   Penicillins Other (See Comments) 09/25/2014   Vancomycin Swelling and Other (See Comments) 05/27/2016    Family History  Problem Relation Age of Onset   Dementia Mother    Kidney cancer Father    Kidney failure Brother    Congestive Heart Failure Brother    Healthy Brother    Heart Problems Son    Healthy Son    Healthy Daughter     Social History   Socioeconomic History   Marital status: Married    Spouse name: Not on file   Number of children: Not on file   Years of education: Not on file   Highest education level: Not on file  Occupational History   Not on file  Tobacco Use   Smoking status: Never    Passive exposure: Past   Smokeless tobacco: Never  Vaping Use   Vaping Use: Never used  Substance and Sexual Activity   Alcohol use: No   Drug use: No   Sexual activity: Not on file  Other Topics Concern   Not on file  Social History Narrative   Not on file   Social Determinants of Health   Financial Resource Strain: Not on file  Food Insecurity: Not on file  Transportation Needs: Not on file  Physical Activity: Not on file  Stress: Not on file  Social Connections: Not on file   Review of systems General: negative for malaise, night sweats, fever, chills, weight loss Neck: Negative for lumps, goiter, pain and significant neck swelling Resp: Negative for cough, wheezing, dyspnea at rest CV: Negative for chest pain, leg swelling, palpitations, orthopnea GI: denies melena, hematochezia, nausea, vomiting, diarrhea, constipation, early satiety or unintentional weight loss. +dysphagia  +odynophagia  MSK: Negative for joint pain or swelling, back pain, and muscle pain. Derm: Negative for itching or rash Psych: Denies depression, anxiety, memory loss, confusion. No homicidal or suicidal ideation.  Heme: Negative for prolonged bleeding, bruising easily, and swollen nodes. Endocrine: Negative for cold or heat intolerance, polyuria, polydipsia and goiter. Neuro: negative for tremor, gait imbalance, syncope and seizures. The remainder of the review of systems is noncontributory.  Physical Exam: BP (!) 147/98   Pulse 79   Temp 97.7 F (36.5 C) (Oral)   Ht 6' (1.829 m)   Wt 245 lb 9.6 oz (111.4 kg)   BMI 33.31 kg/m  General:   Alert and oriented. No distress noted. Pleasant and cooperative.  Head:  Normocephalic and atraumatic. Eyes:  Conjuctiva clear without scleral icterus. Mouth:  Oral mucosa pink and moist. Good dentition. No lesions. Heart: Normal rate and rhythm, s1 and s2 heart sounds present.  Lungs: Clear lung sounds in all lobes. Respirations equal and unlabored. Abdomen:  +BS, soft, non-tender and non-distended. No rebound or guarding. No HSM or masses noted. Derm: No palmar erythema or jaundice Msk:  Symmetrical without gross deformities. Normal posture. Extremities:  Without edema. Neurologic:  Alert and  oriented x4 Psych:  Alert and cooperative. Normal mood and affect.  Invalid input(s): "6 MONTHS"   ASSESSMENT: Ryan Fuller is a 57 y.o. male presenting today for dysphagia/odynophagia  Presenting today with burning and pain in his throat with swallowing as well as some dysphagia.  Having some food regurgitation at times when he bends over, however no notable heartburn.  No recent antibiotics or steroids.  He notes Mylanta helps to relieve his symptoms.  He has been on Prevacid for some time, will stop this and start omeprazole 40 mg once daily.  Will also send course of Carafate 1 g 4 times daily for symptom relief.  Differentials include uncontrolled  GERD, esophagitis, esophageal stricture, ring, web, stenosis.  Recommend proceeding with upper endoscopy +/- dilation for further evaluation of patient's symptoms. Indications, risks and benefits of procedure discussed in detail with patient. Patient verbalized understanding and is in agreement to proceed with EGD.   PLAN:  Schedule EGD +/- dilation ASA II 2. Stop prevacid, start Omeprazole 40mg  daily  3. Good reflux precautions 4. Carafte 1g QID, dissolve tablet in 1/2 oz water, mix and drink  All questions were answered, patient verbalized understanding and is in agreement with plan as outlined above.   Follow Up: 4 months   Cortne Amara L. Jeanmarie Hubert, MSN, APRN, AGNP-C Adult-Gerontology Nurse Practitioner [facility]  I have reviewed the note and agree with the APP's assessment as described in this progress note  Katrinka Blazing, MD Gastroenterology and Hepatology [facility]

## 2023-05-20 ENCOUNTER — Ambulatory Visit (HOSPITAL_COMMUNITY): Payer: Medicare HMO | Admitting: Certified Registered"

## 2023-05-20 ENCOUNTER — Encounter (HOSPITAL_COMMUNITY)
Admission: RE | Disposition: A | Discharge status: Home / Self Care | Payer: Self-pay | Source: Home / Self Care | Attending: Gastroenterology

## 2023-05-20 ENCOUNTER — Other Ambulatory Visit: Payer: Self-pay

## 2023-05-20 ENCOUNTER — Ambulatory Visit (HOSPITAL_COMMUNITY)
Admission: RE | Admit: 2023-05-20 | Discharge: 2023-05-20 | Disposition: A | Discharge status: Home / Self Care | Payer: Medicare HMO | Source: Home / Self Care | Attending: Gastroenterology | Admitting: Gastroenterology

## 2023-05-20 ENCOUNTER — Encounter (HOSPITAL_COMMUNITY): Payer: Self-pay | Admitting: Gastroenterology

## 2023-05-20 DIAGNOSIS — Z79899 Other long term (current) drug therapy: Secondary | ICD-10-CM | POA: Insufficient documentation

## 2023-05-20 DIAGNOSIS — K449 Diaphragmatic hernia without obstruction or gangrene: Secondary | ICD-10-CM | POA: Insufficient documentation

## 2023-05-20 DIAGNOSIS — K219 Gastro-esophageal reflux disease without esophagitis: Secondary | ICD-10-CM | POA: Diagnosis not present

## 2023-05-20 DIAGNOSIS — R1310 Dysphagia, unspecified: Secondary | ICD-10-CM | POA: Insufficient documentation

## 2023-05-20 DIAGNOSIS — R079 Chest pain, unspecified: Secondary | ICD-10-CM | POA: Diagnosis not present

## 2023-05-20 HISTORY — PX: BIOPSY: SHX5522

## 2023-05-20 HISTORY — PX: ESOPHAGEAL DILATION: SHX303

## 2023-05-20 HISTORY — PX: ESOPHAGOGASTRODUODENOSCOPY (EGD) WITH PROPOFOL: SHX5813

## 2023-05-20 SURGERY — ESOPHAGOGASTRODUODENOSCOPY (EGD) WITH PROPOFOL
Anesthesia: General

## 2023-05-20 MED ORDER — FAMOTIDINE 20 MG PO TABS: 20.0000 mg | ORAL_TABLET | Freq: Every day | ORAL | 1 refills | Status: DC

## 2023-05-20 MED ORDER — LACTATED RINGERS IV SOLN: INTRAVENOUS | Status: DC | PRN

## 2023-05-20 MED ORDER — LACTATED RINGERS IV SOLN: INTRAVENOUS | Status: DC

## 2023-05-20 MED ORDER — PROPOFOL 500 MG/50ML IV EMUL
INTRAVENOUS | Status: DC | PRN
  Administered 2023-05-20: 150 ug/kg/min via INTRAVENOUS

## 2023-05-20 MED ORDER — PROPOFOL 10 MG/ML IV BOLUS
INTRAVENOUS | Status: DC | PRN
  Administered 2023-05-20: 100 mg via INTRAVENOUS
  Administered 2023-05-20: 40 mg via INTRAVENOUS
  Administered 2023-05-20: 30 mg via INTRAVENOUS

## 2023-05-20 MED ORDER — PROPOFOL 500 MG/50ML IV EMUL
INTRAVENOUS | Status: AC
  Filled 2023-05-20: qty 50

## 2023-05-20 MED ORDER — LIDOCAINE HCL (PF) 2 % IJ SOLN
INTRAMUSCULAR | Status: AC
  Filled 2023-05-20: qty 5

## 2023-05-20 NOTE — Transfer of Care (Addendum)
Immediate Anesthesia Transfer of Care Note  Patient: Ryan Fuller  Procedure(s) Performed: ESOPHAGOGASTRODUODENOSCOPY (EGD) WITH PROPOFOL ESOPHAGEAL DILATION BIOPSY  Patient Location: PACU  Anesthesia Type:General  Level of Consciousness: drowsy and patient cooperative  Airway & Oxygen Therapy: Patient Spontanous Breathing  Post-op Assessment: Report given to RN and Post -op Vital signs reviewed and stable  Post vital signs: Reviewed and stable  Last Vitals:  Vitals Value Taken Time  BP 93/65 05/20/23   1035  Temp 36.6 05/20/23   1035  Pulse 67 05/20/23   1035  Resp 17 05/20/23   1035  SpO2 93% 05/20/23   1035    Last Pain:  Vitals:   05/20/23 1016  TempSrc:   PainSc: 0-No pain      Patients Stated Pain Goal: 5 (05/20/23 0743)  Complications: No notable events documented.

## 2023-05-20 NOTE — Interval H&P Note (Signed)
History and Physical Interval Note:  05/20/2023 9:32 AM  Ryan Fuller  has presented today for surgery, with the diagnosis of odynophagia, dysphagia.  The various methods of treatment have been discussed with the patient and family. After consideration of risks, benefits and other options for treatment, the patient has consented to  Procedure(s) with comments: ESOPHAGOGASTRODUODENOSCOPY (EGD) WITH PROPOFOL (N/A) - 10:15am;asa 2 ESOPHAGEAL DILATION (N/A) - 10:15am;asa 2 as a surgical intervention.  The patient's history has been reviewed, patient examined, no change in status, stable for surgery.  I have reviewed the patient's chart and labs.  Questions were answered to the patient's satisfaction.     [name]

## 2023-05-20 NOTE — Anesthesia Postprocedure Evaluation (Signed)
Anesthesia Post Note  Patient: Ryan Fuller  Procedure(s) Performed: ESOPHAGOGASTRODUODENOSCOPY (EGD) WITH PROPOFOL ESOPHAGEAL DILATION BIOPSY  Patient location during evaluation: Phase II Anesthesia Type: General Level of consciousness: awake Pain management: pain level controlled Vital Signs Assessment: post-procedure vital signs reviewed and stable Respiratory status: spontaneous breathing and respiratory function stable Cardiovascular status: blood pressure returned to baseline and stable Postop Assessment: no headache and no apparent nausea or vomiting Anesthetic complications: no Comments: Late entry   No notable events documented.   Last Vitals:  Vitals:   05/20/23 0743 05/20/23 1035  BP: 125/82 93/65  Pulse:  67  Resp: 13 17  Temp: 36.6 C 36.6 C  SpO2: 96% 93%    Last Pain:  Vitals:   05/20/23 1040  TempSrc:   PainSc: 0-No pain                 Windell Norfolk

## 2023-05-20 NOTE — Anesthesia Preprocedure Evaluation (Signed)
Anesthesia Evaluation  Patient identified by MRN, date of birth, ID band Patient awake    Reviewed: Allergy & Precautions, H&P , NPO status , Patient's Chart, lab work & pertinent test results, reviewed documented beta blocker date and time   Airway Mallampati: II  TM Distance: >3 FB Neck ROM: full    Dental no notable dental hx.    Pulmonary neg pulmonary ROS   Pulmonary exam normal breath sounds clear to auscultation       Cardiovascular Exercise Tolerance: Good negative cardio ROS  Rhythm:regular Rate:Normal     Neuro/Psych negative neurological ROS  negative psych ROS   GI/Hepatic negative GI ROS, Neg liver ROS,GERD  ,,  Endo/Other  negative endocrine ROS    Renal/GU Renal diseasenegative Renal ROS  negative genitourinary   Musculoskeletal   Abdominal   Peds  Hematology negative hematology ROS (+)   Anesthesia Other Findings   Reproductive/Obstetrics negative OB ROS                             Anesthesia Physical Anesthesia Plan  ASA: 2  Anesthesia Plan: General   Post-op Pain Management:    Induction:   PONV Risk Score and Plan: Propofol infusion  Airway Management Planned:   Additional Equipment:   Intra-op Plan:   Post-operative Plan:   Informed Consent: I have reviewed the patients History and Physical, chart, labs and discussed the procedure including the risks, benefits and alternatives for the proposed anesthesia with the patient or authorized representative who has indicated his/her understanding and acceptance.     Dental Advisory Given  Plan Discussed with: CRNA  Anesthesia Plan Comments:        Anesthesia Quick Evaluation  

## 2023-05-20 NOTE — Op Note (Signed)
[facility] Patient Name: Ryan Fuller Procedure Date: 05/20/2023 10:07 AM MRN: [id number] Date of Birth: [birth year] Attending MD: Katrinka Blazing , , [id number] CSN: [id number] Age: 57 Admit Type: Outpatient Procedure:                Upper GI endoscopy Indications:              Dysphagia, Unexplained chest pain Providers:                Katrinka Blazing, Edrick Kins, RN, Lennice Sites                            Technician, Technician Referring MD:              Medicines:                Monitored Anesthesia Care Complications:            No immediate complications. Estimated Blood Loss:     Estimated blood loss: none. Procedure:                Pre-Anesthesia Assessment:                           - Prior to the procedure, a History and Physical                            was performed, and patient medications, allergies                            and sensitivities were reviewed. The patient's                            tolerance of previous anesthesia was reviewed.                           - The risks and benefits of the procedure and the                            sedation options and risks were discussed with the                            patient. All questions were answered and informed                            consent was obtained.                           - ASA Grade Assessment: II - A patient with mild                            systemic disease.                           After obtaining informed consent, the endoscope was                            passed under direct vision. Throughout the  procedure, the patient's blood pressure, pulse, and                            oxygen saturations were monitored continuously. The                            [id number]-H190 ([id number]) scope was introduced through the                            mouth, and advanced to the second part of duodenum.                            The upper GI endoscopy was accomplished  without                            difficulty. The patient tolerated the procedure                            well. Scope In: 10:21:52 AM Scope Out: 10:29:47 AM Total Procedure Duration: 0 hours 7 minutes 55 seconds  Findings:      No endoscopic abnormality was evident in the esophagus to explain the       patient's complaint of dysphagia. It was decided, however, to proceed       with dilation of the entire esophagus. A guidewire was placed and the       scope was withdrawn. Dilation was performed with a Savary dilator with       no resistance at 18 mm. The dilation site was examined following       endoscope reinsertion and showed no change. Biopsies were taken with a       cold forceps for histology.      A 1 cm hiatal hernia was present.      The stomach was normal.      The examined duodenum was normal. Impression:               - No endoscopic esophageal abnormality to explain                            patient's dysphagia. Esophagus dilated. Dilated.                            Biopsied.                           - 1 cm hiatal hernia.                           - Normal stomach.                           - Normal examined duodenum. Moderate Sedation:      Per Anesthesia Care Recommendation:           - Discharge patient to home (ambulatory).                           - Resume previous diet.                           -  Await pathology results.                           - Continue present medications.                           - Take Pepcid 20 mg at night. Procedure Code(s):        --- Professional ---                           [phone], Esophagogastroduodenoscopy, flexible,                            transoral; with insertion of guide wire followed by                            passage of dilator(s) through esophagus over guide                            wire                           43239, 59, Esophagogastroduodenoscopy, flexible,                            transoral; with  biopsy, single or multiple Diagnosis Code(s):        --- Professional ---                           R13.10, Dysphagia, unspecified                           K44.9, Diaphragmatic hernia without obstruction or                            gangrene                           R07.9, Chest pain, unspecified CPT copyright 2022 American Medical Association. All rights reserved. The codes documented in this report are preliminary and upon coder review may  be revised to meet current compliance requirements. Katrinka Blazing, MD Katrinka Blazing,  05/20/2023 10:39:56 AM This report has been signed electronically. Number of Addenda: 0

## 2023-05-20 NOTE — Discharge Instructions (Signed)
You are being discharged to home.  Resume your previous diet.  We are waiting for your pathology results.  Continue your present medications.  Take Pepcid 20 mg at night.

## 2023-05-23 ENCOUNTER — Telehealth (INDEPENDENT_AMBULATORY_CARE_PROVIDER_SITE_OTHER): Payer: Self-pay

## 2023-05-23 NOTE — Telephone Encounter (Signed)
Patient made aware and states understanding.

## 2023-05-23 NOTE — Telephone Encounter (Signed)
He has to take both on the same day, omeprazole in the morning and famotidine at night, 30 minutes before breakfast and supper.

## 2023-05-23 NOTE — Telephone Encounter (Signed)
Patient says he was already taking omeprazole and at procedure last week we added on famotidine. He wants to know if he was to take these both per day/ omeprazole in am and famotidine qhs or stop omeprazole and start famotidine. Please advise.

## 2023-05-25 LAB — SURGICAL PATHOLOGY

## 2023-05-26 ENCOUNTER — Encounter (INDEPENDENT_AMBULATORY_CARE_PROVIDER_SITE_OTHER): Payer: Self-pay | Admitting: *Deleted

## 2023-05-26 ENCOUNTER — Encounter (HOSPITAL_COMMUNITY): Payer: Self-pay | Admitting: Gastroenterology

## 2023-05-27 DIAGNOSIS — G894 Chronic pain syndrome: Secondary | ICD-10-CM | POA: Diagnosis not present

## 2023-05-27 DIAGNOSIS — Z6833 Body mass index (BMI) 33.0-33.9, adult: Secondary | ICD-10-CM | POA: Diagnosis not present

## 2023-05-27 DIAGNOSIS — M5030 Other cervical disc degeneration, unspecified cervical region: Secondary | ICD-10-CM | POA: Diagnosis not present

## 2023-05-27 DIAGNOSIS — M5136 Other intervertebral disc degeneration, lumbar region: Secondary | ICD-10-CM | POA: Diagnosis not present

## 2023-05-27 DIAGNOSIS — M159 Polyosteoarthritis, unspecified: Secondary | ICD-10-CM | POA: Diagnosis not present

## 2023-05-27 DIAGNOSIS — E6609 Other obesity due to excess calories: Secondary | ICD-10-CM | POA: Diagnosis not present

## 2023-09-08 ENCOUNTER — Ambulatory Visit (INDEPENDENT_AMBULATORY_CARE_PROVIDER_SITE_OTHER): Payer: Medicare HMO | Admitting: Gastroenterology

## 2023-09-08 ENCOUNTER — Encounter (INDEPENDENT_AMBULATORY_CARE_PROVIDER_SITE_OTHER): Payer: Self-pay | Admitting: Gastroenterology

## 2023-09-08 VITALS — BP 131/89 | HR 90 | Temp 97.8°F | Ht 72.0 in | Wt 244.6 lb

## 2023-09-08 DIAGNOSIS — Z860101 Personal history of adenomatous and serrated colon polyps: Secondary | ICD-10-CM

## 2023-09-08 DIAGNOSIS — R1310 Dysphagia, unspecified: Secondary | ICD-10-CM

## 2023-09-08 DIAGNOSIS — Z860100 Personal history of colon polyps, unspecified: Secondary | ICD-10-CM

## 2023-09-08 DIAGNOSIS — K219 Gastro-esophageal reflux disease without esophagitis: Secondary | ICD-10-CM

## 2023-09-08 NOTE — Patient Instructions (Signed)
Please continue with omeprazole 40 mg once daily, 30 minutes prior to breakfast in the mornings and famotidine 20 mg every evening. Continue to avoid thicker, drier foods.  Make sure you are taking small bites, chewing thoroughly, taking sips of water between each bite.  If swallowing gets worse please let me know. You are due for colonoscopy in December, we will get you scheduled for this.  Follow-up 1 year  It was a pleasure to see you today. I want to create trusting relationships with patients and provide genuine, compassionate, and quality care. I truly value your feedback! please be on the lookout for a survey regarding your visit with me today. I appreciate your input about our visit and your time in completing this!    Blair Lundeen L. Jeanmarie Hubert, MSN, APRN, AGNP-C Adult-Gerontology Nurse Practitioner [facility]

## 2023-09-08 NOTE — Progress Notes (Addendum)
Referring Provider: Assunta Found, MD Primary Care Physician:  Assunta Found, MD Primary GI Physician: Dr. Levon Hedger   Chief Complaint  Patient presents with   Gastroesophageal Reflux    Follow up on GERD. States that reflux is better but still having some trouble swallowing but states its from the surgery he had and thinks it will not get any better.    HPI:   Ryan Fuller is a 57 y.o. male with past medical history of GERD, kidney stones, multiple cervical surgeries   Patient presenting today for follow up of GERD/dysphagia  Last seen June 2024, at that time patient noting some burning in the back of his throat and chest.  Notes he had some food regurgitation upon bending over.  Taking some Mylanta which seemed to help some.  Also notes a dry cough and sometimes a sore throat.  Maintained on Prevacid for a number of years at that time.  Recommended to schedule EGD, stop Prevacid, start omeprazole 40 mg daily, Carafate 1 g 4 times daily.  EGD as outlined below, advised to start Pepcid 20mg  at bedtime  Present: GERD doing well. Continues to have some dysphagia on occasion. No breakthrough heartburn or acid regurgitation. Denies cough or sore throat. He notes that dysphagia has been ongoing for the past few years since he had cervical neck surgery. does not occur very often, he tries to avoid thicker, rougher foods as these tend to cause him the most issues. Occurring maybe 1 time per week. Denies issues with liquids or pills. He can usually get food to pass with repeated swallowing or drinking water.   Patient denies melena, hematochezia, nausea, vomiting, diarrhea, constipation, odyonophagia, early satiety or weight loss.   Last EGD:  05/2023- No endoscopic esophageal abnormality to explain                            patient's dysphagia. Esophagus dilated. Dilated.                            Biopsied.                           - 1 cm hiatal hernia.                           -  Normal stomach.                           - Normal examined duodenum. Barium esophagram 2020 -  prominent cricopharyngeus but barium pill but barium pill passed from oral cavity stomach without any delay Last Colonoscopy: December 2019 - he has history of colonic polyps.  He had 2 small tubular adenomas removed.  He had 2 also the transverse colon and biopsy revealed nonspecific inflammation most likely due to NSAIDs which were discontinued.  He also had scattered diverticula at splenic flexure and sigmoid colon.   Recommended to repeat in 2024.   Past Medical History:  Diagnosis Date   Dysphagia 08/10/2017   Gout    Gout 08/10/2017   Iritis    dx by Dr. Charise Killian   Kidney stones     Past Surgical History:  Procedure Laterality Date   BACK SURGERY     BIOPSY  11/13/2018   Procedure: BIOPSY;  Surgeon: Karilyn Cota,  Joline Maxcy, MD;  [facility];  Service: Endoscopy;;  terminal ileum colon   BIOPSY  05/20/2023   Procedure: BIOPSY;  Surgeon: Dolores Frame, MD;  [facility];  Service: Gastroenterology;;   COLONOSCOPY N/A 03/01/2013   Procedure: COLONOSCOPY;  Surgeon: Malissa Hippo, MD;  [facility];  Service: Endoscopy;  Laterality: N/A;  100   COLONOSCOPY N/A 11/13/2018   Procedure: COLONOSCOPY;  Surgeon: Malissa Hippo, MD;  [facility];  Service: Endoscopy;  Laterality: N/A;  1015am   EGD/ED     years ago   ESOPHAGEAL DILATION N/A 08/31/2017   Procedure: ESOPHAGEAL DILATION;  Surgeon: Malissa Hippo, MD;  [facility];  Service: Endoscopy;  Laterality: N/A;   ESOPHAGEAL DILATION N/A 05/20/2023   Procedure: ESOPHAGEAL DILATION;  Surgeon: Dolores Frame, MD;  [facility];  Service: Gastroenterology;  Laterality: N/A;  10:15am;asa 2   ESOPHAGOGASTRODUODENOSCOPY N/A 08/31/2017   Procedure: ESOPHAGOGASTRODUODENOSCOPY (EGD);  Surgeon: Malissa Hippo, MD;  [facility];  Service: Endoscopy;   Laterality: N/A;  3:10   ESOPHAGOGASTRODUODENOSCOPY (EGD) WITH PROPOFOL N/A 05/20/2023   Procedure: ESOPHAGOGASTRODUODENOSCOPY (EGD) WITH PROPOFOL;  Surgeon: Dolores Frame, MD;  [facility];  Service: Gastroenterology;  Laterality: N/A;  10:15am;asa 2   NECK SURGERY     x2   POLYPECTOMY  11/13/2018   Procedure: POLYPECTOMY;  Surgeon: Malissa Hippo, MD;  [facility];  Service: Endoscopy;;  colon   Rotator cuff surgery Right     Current Outpatient Medications  Medication Sig Dispense Refill   allopurinol (ZYLOPRIM) 100 MG tablet Take 100 mg by mouth 2 (two) times daily.      famotidine (PEPCID) 20 MG tablet Take 1 tablet (20 mg total) by mouth daily. 90 tablet 1   ibuprofen (ADVIL) 200 MG tablet Take 400-600 mg by mouth every 8 (eight) hours as needed (pain.).     omeprazole (PRILOSEC) 40 MG capsule Take 1 capsule (40 mg total) by mouth daily. 60 capsule 2   polyethylene glycol (MIRALAX / GLYCOLAX) packet Take 34 g by mouth daily. w/Coffee     pramipexole (MIRAPEX) 1 MG tablet Take 1 mg by mouth at bedtime.     Psyllium (METAMUCIL FREE & NATURAL PO) Take 2 scoop by mouth at bedtime.     No current facility-administered medications for this visit.    Allergies as of 09/08/2023 - Review Complete 09/08/2023  Allergen Reaction Noted   Phenergan [promethazine hcl] Anaphylaxis and Other (See Comments) 02/20/2013   Colchicine Other (See Comments) 02/20/2013   Penicillins Other (See Comments) 09/25/2014   Vancomycin Swelling and Other (See Comments) 05/27/2016    Family History  Problem Relation Age of Onset   Dementia Mother    Kidney cancer Father    Kidney failure Brother    Congestive Heart Failure Brother    Healthy Brother    Heart Problems Son    Healthy Son    Healthy Daughter     Social History   Socioeconomic History   Marital status: Married    Spouse name: Not on file   Number of children: Not on file   Years of education: Not on  file   Highest education level: Not on file  Occupational History   Not on file  Tobacco Use   Smoking status: Never    Passive exposure: Past   Smokeless tobacco: Never  Vaping Use  Vaping status: Never Used  Substance and Sexual Activity   Alcohol use: No   Drug use: No   Sexual activity: Not on file  Other Topics Concern   Not on file  Social History Narrative   Not on file   Social Determinants of Health   Financial Resource Strain: Not on file  Food Insecurity: Not on file  Transportation Needs: Not on file  Physical Activity: Not on file  Stress: Not on file  Social Connections: Not on file    Review of systems General: negative for malaise, night sweats, fever, chills, weight loss Neck: Negative for lumps, goiter, pain and significant neck swelling Resp: Negative for cough, wheezing, dyspnea at rest CV: Negative for chest pain, leg swelling, palpitations, orthopnea GI: denies melena, hematochezia, nausea, vomiting, diarrhea, constipation, odyonophagia, early satiety or unintentional weight loss. +dysphagia  The remainder of the review of systems is noncontributory.  Physical Exam: BP 131/89 (BP Location: Left Arm, Patient Position: Sitting, Cuff Size: Large)   Pulse 90   Temp 97.8 F (36.6 C) (Oral)   Ht 6' (1.829 m)   Wt 244 lb 9.6 oz (110.9 kg)   BMI 33.17 kg/m  General:   Alert and oriented. No distress noted. Pleasant and cooperative.  Head:  Normocephalic and atraumatic. Eyes:  Conjuctiva clear without scleral icterus. Mouth:  Oral mucosa pink and moist. Good dentition. No lesions. Heart: Normal rate and rhythm, s1 and s2 heart sounds present.  Lungs: Clear lung sounds in all lobes. Respirations equal and unlabored. Abdomen:  +BS, soft, non-tender and non-distended. No rebound or guarding. No HSM or masses noted. Psych:  Alert and cooperative. Normal mood and affect.  Invalid input(s): "6 MONTHS"   ASSESSMENT: Ryan Fuller is a 57 y.o. male  presenting today for follow-up of GERD/dysphagia.  GERD well-managed on omeprazole 40 mg once daily, Pepcid 20 mg nightly.  He denies any breakthrough GERD symptoms, cough, sore throat, hoarseness.  He has some ongoing mild dysphagia since previous cervical neck surgery x 2 a few years back.  Esophagram in 2020 showed prominent cricopharyngeus.  Most recent EGD without cause for dysphagia, esophagus was empirically dilated.  At this time he does not wish to undergo any further investigations for his dysphagia.  I recommend he continue to avoid thicker, drier foods, make sure to take small bites, chewing thoroughly and taking sips of water between each bite.  If swallowing worsens or he wishes to pursue further investigation of his dysphagia he should let me know.  He does have history of colonic tubular adenomas.  Last colonoscopy December 2020, he will be due for repeat colonoscopy December 2024.  Will get him scheduled for this. Indications, risks and benefits of procedure discussed in detail with patient. Patient verbalized understanding and is in agreement to proceed with Colonoscopy    PLAN:  Chewing precautions 2.  Continue omeprazole 40mg  daily  3. Continue famotidine 20mg  at bedtime  4. Schedule Colonoscopy December 2024, ASA II  All questions were answered, patient verbalized understanding and is in agreement with plan as outlined above.   Follow Up: 1 year   Lindie Roberson L. Jeanmarie Hubert, MSN, APRN, AGNP-C Adult-Gerontology Nurse Practitioner [facility]  I have reviewed the note and agree with the APP's assessment as described in this progress note  Katrinka Blazing, MD Gastroenterology and Hepatology [facility]

## 2023-09-09 ENCOUNTER — Encounter (INDEPENDENT_AMBULATORY_CARE_PROVIDER_SITE_OTHER): Payer: Self-pay

## 2023-09-09 MED ORDER — PEG 3350-KCL-NA BICARB-NACL 420 G PO SOLR: 4000.0000 mL | Freq: Once | ORAL | 0 refills | Status: AC

## 2023-09-09 NOTE — Addendum Note (Signed)
Addended by: Marlowe Shores on: 09/09/2023 12:07 PM   Modules accepted: Orders

## 2023-10-17 ENCOUNTER — Telehealth (INDEPENDENT_AMBULATORY_CARE_PROVIDER_SITE_OTHER): Payer: Self-pay | Admitting: Gastroenterology

## 2023-10-17 NOTE — Telephone Encounter (Signed)
Wife left voicemail in regards to someone calling him stating NPO after 2 pm today. Wife states that pt is out hunting and unable to remember everything that was said.  Returned call to wife and went through instructions. Instructions were sent on my chart but from what I see on Epic pt has not read the message. Went through instructions with wife. Wife repeated instructions back to me.

## 2023-10-18 NOTE — Anesthesia Preprocedure Evaluation (Signed)
Anesthesia Evaluation  Patient identified by MRN, date of birth, ID band Patient awake    Reviewed: Allergy & Precautions, H&P , NPO status , Patient's Chart, lab work & pertinent test results, reviewed documented beta blocker date and time   Airway Mallampati: II  TM Distance: >3 FB Neck ROM: full    Dental no notable dental hx. (+) Dental Advisory Given, Teeth Intact   Pulmonary neg pulmonary ROS   Pulmonary exam normal breath sounds clear to auscultation       Cardiovascular Exercise Tolerance: Good negative cardio ROS Normal cardiovascular exam Rhythm:regular Rate:Normal     Neuro/Psych negative neurological ROS  negative psych ROS   GI/Hepatic Neg liver ROS,GERD  ,,dysphagia   Endo/Other  negative endocrine ROS    Renal/GU   negative genitourinary   Musculoskeletal  (+) Arthritis , Osteoarthritis,    Abdominal   Peds  Hematology negative hematology ROS (+)   Anesthesia Other Findings   Reproductive/Obstetrics negative OB ROS                             Anesthesia Physical Anesthesia Plan  ASA: 2  Anesthesia Plan: General   Post-op Pain Management: Minimal or no pain anticipated   Induction: Intravenous  PONV Risk Score and Plan: Propofol infusion  Airway Management Planned: Nasal Cannula and Natural Airway  Additional Equipment: None  Intra-op Plan:   Post-operative Plan:   Informed Consent: I have reviewed the patients History and Physical, chart, labs and discussed the procedure including the risks, benefits and alternatives for the proposed anesthesia with the patient or authorized representative who has indicated his/her understanding and acceptance.     Dental Advisory Given  Plan Discussed with: CRNA  Anesthesia Plan Comments:        Anesthesia Quick Evaluation

## 2023-10-19 ENCOUNTER — Other Ambulatory Visit: Payer: Self-pay

## 2023-10-19 ENCOUNTER — Ambulatory Visit (HOSPITAL_COMMUNITY): Payer: Self-pay | Admitting: Anesthesiology

## 2023-10-19 ENCOUNTER — Ambulatory Visit (HOSPITAL_BASED_OUTPATIENT_CLINIC_OR_DEPARTMENT_OTHER): Payer: Medicare HMO | Admitting: Anesthesiology

## 2023-10-19 ENCOUNTER — Ambulatory Visit (HOSPITAL_COMMUNITY)
Admission: RE | Admit: 2023-10-19 | Discharge: 2023-10-19 | Disposition: A | Discharge status: Home / Self Care | Payer: Medicare HMO | Attending: Gastroenterology | Admitting: Gastroenterology

## 2023-10-19 ENCOUNTER — Encounter (HOSPITAL_COMMUNITY)
Admission: RE | Disposition: A | Discharge status: Home / Self Care | Payer: Self-pay | Source: Home / Self Care | Attending: Gastroenterology

## 2023-10-19 ENCOUNTER — Encounter (HOSPITAL_COMMUNITY): Payer: Self-pay | Admitting: Gastroenterology

## 2023-10-19 DIAGNOSIS — K5730 Diverticulosis of large intestine without perforation or abscess without bleeding: Secondary | ICD-10-CM | POA: Diagnosis not present

## 2023-10-19 DIAGNOSIS — D122 Benign neoplasm of ascending colon: Secondary | ICD-10-CM

## 2023-10-19 DIAGNOSIS — Z860100 Personal history of colon polyps, unspecified: Secondary | ICD-10-CM

## 2023-10-19 DIAGNOSIS — D123 Benign neoplasm of transverse colon: Secondary | ICD-10-CM | POA: Diagnosis not present

## 2023-10-19 DIAGNOSIS — Z1211 Encounter for screening for malignant neoplasm of colon: Secondary | ICD-10-CM | POA: Diagnosis not present

## 2023-10-19 DIAGNOSIS — D125 Benign neoplasm of sigmoid colon: Secondary | ICD-10-CM | POA: Insufficient documentation

## 2023-10-19 DIAGNOSIS — K648 Other hemorrhoids: Secondary | ICD-10-CM | POA: Insufficient documentation

## 2023-10-19 DIAGNOSIS — K635 Polyp of colon: Secondary | ICD-10-CM

## 2023-10-19 DIAGNOSIS — Z800 Family history of malignant neoplasm of digestive organs: Secondary | ICD-10-CM | POA: Insufficient documentation

## 2023-10-19 DIAGNOSIS — Z860101 Personal history of adenomatous and serrated colon polyps: Secondary | ICD-10-CM | POA: Diagnosis not present

## 2023-10-19 DIAGNOSIS — K219 Gastro-esophageal reflux disease without esophagitis: Secondary | ICD-10-CM | POA: Insufficient documentation

## 2023-10-19 HISTORY — PX: POLYPECTOMY: SHX5525

## 2023-10-19 HISTORY — PX: BIOPSY: SHX5522

## 2023-10-19 HISTORY — PX: COLONOSCOPY WITH PROPOFOL: SHX5780

## 2023-10-19 LAB — HM COLONOSCOPY

## 2023-10-19 SURGERY — COLONOSCOPY WITH PROPOFOL
Anesthesia: General

## 2023-10-19 MED ORDER — PROPOFOL 500 MG/50ML IV EMUL
INTRAVENOUS | Status: DC | PRN
  Administered 2023-10-19: 150 ug/kg/min via INTRAVENOUS

## 2023-10-19 MED ORDER — LIDOCAINE HCL (PF) 2 % IJ SOLN
INTRAMUSCULAR | Status: DC | PRN
  Administered 2023-10-19: 50 mg via INTRADERMAL

## 2023-10-19 MED ORDER — PROPOFOL 10 MG/ML IV BOLUS
INTRAVENOUS | Status: DC | PRN
  Administered 2023-10-19: 100 mg via INTRAVENOUS

## 2023-10-19 MED ORDER — LACTATED RINGERS IV SOLN: INTRAVENOUS | Status: DC | PRN

## 2023-10-19 MED ORDER — EPHEDRINE SULFATE-NACL 50-0.9 MG/10ML-% IV SOSY
PREFILLED_SYRINGE | INTRAVENOUS | Status: DC | PRN
  Administered 2023-10-19: 5 mg via INTRAVENOUS
  Administered 2023-10-19: 10 mg via INTRAVENOUS

## 2023-10-19 MED ORDER — STERILE WATER FOR IRRIGATION IR SOLN
Status: DC | PRN
  Administered 2023-10-19: 240 mL

## 2023-10-19 MED ORDER — EPHEDRINE 5 MG/ML INJ
INTRAVENOUS | Status: AC
  Filled 2023-10-19: qty 5

## 2023-10-19 NOTE — H&P (Signed)
Ryan Fuller is an 57 y.o. male.   Chief Complaint: hitory of colon polyps HPI: Ryan Fuller is a 56 y.o. male with past medical history of GERD, kidney stones, multiple cervical surgeries, coming for history of colon polyps.  Last colonoscopy in 2019, had 2 tubular adenomas.  The patient denies having any complaints such as melena, hematochezia, abdominal pain or distention, change in her bowel movement consistency or frequency, no changes in weight recently.  Aunt had diagnosis of colon cancer  Past Medical History:  Diagnosis Date   Dysphagia 08/10/2017   Gout    Gout 08/10/2017   Iritis    dx by Dr. Charise Killian   Kidney stones     Past Surgical History:  Procedure Laterality Date   BACK SURGERY     BIOPSY  11/13/2018   Procedure: BIOPSY;  Surgeon: Malissa Hippo, MD;  [facility];  Service: Endoscopy;;  terminal ileum colon   BIOPSY  05/20/2023   Procedure: BIOPSY;  Surgeon: Dolores Frame, MD;  [facility];  Service: Gastroenterology;;   COLONOSCOPY N/A 03/01/2013   Procedure: COLONOSCOPY;  Surgeon: Malissa Hippo, MD;  [facility];  Service: Endoscopy;  Laterality: N/A;  100   COLONOSCOPY N/A 11/13/2018   Procedure: COLONOSCOPY;  Surgeon: Malissa Hippo, MD;  [facility];  Service: Endoscopy;  Laterality: N/A;  1015am   EGD/ED     years ago   ESOPHAGEAL DILATION N/A 08/31/2017   Procedure: ESOPHAGEAL DILATION;  Surgeon: Malissa Hippo, MD;  [facility];  Service: Endoscopy;  Laterality: N/A;   ESOPHAGEAL DILATION N/A 05/20/2023   Procedure: ESOPHAGEAL DILATION;  Surgeon: Dolores Frame, MD;  [facility];  Service: Gastroenterology;  Laterality: N/A;  10:15am;asa 2   ESOPHAGOGASTRODUODENOSCOPY N/A 08/31/2017   Procedure: ESOPHAGOGASTRODUODENOSCOPY (EGD);  Surgeon: Malissa Hippo, MD;  [facility];  Service: Endoscopy;  Laterality: N/A;  3:10    ESOPHAGOGASTRODUODENOSCOPY (EGD) WITH PROPOFOL N/A 05/20/2023   Procedure: ESOPHAGOGASTRODUODENOSCOPY (EGD) WITH PROPOFOL;  Surgeon: Dolores Frame, MD;  [facility];  Service: Gastroenterology;  Laterality: N/A;  10:15am;asa 2   NECK SURGERY     x2   POLYPECTOMY  11/13/2018   Procedure: POLYPECTOMY;  Surgeon: Malissa Hippo, MD;  [facility];  Service: Endoscopy;;  colon   ROTATOR CUFF REPAIR Left    Rotator cuff surgery Right     Family History  Problem Relation Age of Onset   Dementia Mother    Kidney cancer Father    Kidney failure Brother    Congestive Heart Failure Brother    Healthy Brother    Heart Problems Son    Healthy Son    Healthy Daughter    Social History:  reports that he has never smoked. He has been exposed to tobacco smoke. He has never used smokeless tobacco. He reports that he does not drink alcohol and does not use drugs.  Allergies:  Allergies  Allergen Reactions   Phenergan [Promethazine Hcl] Anaphylaxis and Other (See Comments)    severe hypotension   Colchicine Other (See Comments)    Depression     Penicillins Other (See Comments)    From childhood Has patient had a PCN reaction causing immediate rash, facial/tongue/throat swelling, SOB or lightheadedness with hypotension: Unknown Has patient had a PCN reaction causing severe rash involving mucus membranes or skin necrosis: Unknown Has patient had a PCN reaction  that required hospitalization Unknown Has patient had a PCN reaction occurring within the last 10 years: Unknown If all of the above answers are "NO", then may proceed with Cephalosporin [place]   Vancomycin Swelling and Other (See Comments)    Lips swelling    Medications Prior to Admission  Medication Sig Dispense Refill   allopurinol (ZYLOPRIM) 100 MG tablet Take 100 mg by mouth 2 (two) times daily.      famotidine (PEPCID) 20 MG tablet Take 1 tablet (20 mg total) by mouth daily. 90 tablet 1    pramipexole (MIRAPEX) 1 MG tablet Take 1 mg by mouth at bedtime.     Psyllium (METAMUCIL FREE & NATURAL PO) Take 2 scoop by mouth at bedtime.     ibuprofen (ADVIL) 200 MG tablet Take 400-600 mg by mouth every 8 (eight) hours as needed (pain.).     omeprazole (PRILOSEC) 40 MG capsule Take 1 capsule (40 mg total) by mouth daily. 60 capsule 2   polyethylene glycol (MIRALAX / GLYCOLAX) packet Take 34 g by mouth daily. w/Coffee      No results found for this or any previous visit (from the past 48 hour(s)). No results found.  Review of Systems  All other systems reviewed and are negative.   Blood pressure 127/78, pulse 62, temperature 97.8 F (36.6 C), temperature source Oral, resp. rate 15, height 6' (1.829 m), weight 106.6 kg, SpO2 97%. Physical Exam  GENERAL: The patient is AO x3, in no acute distress. HEENT: Head is normocephalic and atraumatic. EOMI are intact. Mouth is well hydrated and without lesions. NECK: Supple. No masses LUNGS: Clear to auscultation. No presence of rhonchi/wheezing/rales. Adequate chest expansion HEART: RRR, normal s1 and s2. ABDOMEN: Soft, nontender, no guarding, no peritoneal signs, and nondistended. BS +. No masses. EXTREMITIES: Without any cyanosis, clubbing, rash, lesions or edema. NEUROLOGIC: AOx3, no focal motor deficit. SKIN: no jaundice, no rashes  Assessment/Plan Ryan Fuller is a 57 y.o. male with past medical history of GERD, kidney stones, multiple cervical surgeries, coming for history of colon polyps.  Will proceed with colonoscopy.  Dolores Frame, MD 10/19/2023, 8:34 AM

## 2023-10-19 NOTE — Anesthesia Postprocedure Evaluation (Signed)
Anesthesia Post Note  Patient: Ryan Fuller  Procedure(s) Performed: COLONOSCOPY WITH PROPOFOL POLYPECTOMY BIOPSY  Patient location during evaluation: PACU Anesthesia Type: General Level of consciousness: awake and alert Pain management: pain level controlled Vital Signs Assessment: post-procedure vital signs reviewed and stable Respiratory status: spontaneous breathing, nonlabored ventilation, respiratory function stable and patient connected to nasal cannula oxygen Cardiovascular status: blood pressure returned to baseline and stable Postop Assessment: no apparent nausea or vomiting Anesthetic complications: no   There were no known notable events for this encounter.   Last Vitals:  Vitals:   10/19/23 0902 10/19/23 0907  BP: (!) 97/45 (!) 92/53  Pulse: 73 70  Resp: 19 19  Temp: 36.4 C   SpO2: 95%     Last Pain:  Vitals:   10/19/23 0907  TempSrc:   PainSc: 3                  Ryan Fuller

## 2023-10-19 NOTE — Anesthesia Procedure Notes (Signed)
Date/Time: 10/19/2023 8:45 AM  Performed by: Julian Reil, CRNAPre-anesthesia Checklist: Patient identified, Emergency Drugs available, Suction available and Patient being monitored Patient Re-evaluated:Patient Re-evaluated prior to induction Oxygen Delivery Method: Nasal cannula Induction Type: IV induction Placement Confirmation: positive ETCO2

## 2023-10-19 NOTE — Discharge Instructions (Signed)
You are being discharged to home.  Resume your previous diet.  We are waiting for your pathology results.  Your physician has recommended a repeat colonoscopy for surveillance based on pathology results.  

## 2023-10-19 NOTE — Op Note (Signed)
[employer] Patient Name: Ryan Fuller Procedure Date: 10/19/2023 8:27 AM MRN: [id number] Date of Birth: Jul 14, 1966 Attending MD: Katrinka Blazing , , [id number] CSN: [id number] Age: 57 Admit Type: Outpatient Procedure:                Colonoscopy Indications:              Surveillance: Personal history of adenomatous                            polyps on last colonoscopy 5 years ago Providers:                Katrinka Blazing, Edrick Kins, RN, Zena Amos Referring MD:              Medicines:                Monitored Anesthesia Care Complications:            No immediate complications. Estimated Blood Loss:     Estimated blood loss: none. Procedure:                Pre-Anesthesia Assessment:                           - Prior to the procedure, a History and Physical                            was performed, and patient medications, allergies                            and sensitivities were reviewed. The patient's                            tolerance of previous anesthesia was reviewed.                           - The risks and benefits of the procedure and the                            sedation options and risks were discussed with the                            patient. All questions were answered and informed                            consent was obtained.                           - ASA Grade Assessment: II - A patient with mild                            systemic disease.                           After obtaining informed consent, the colonoscope                            was passed under direct  vision. Throughout the                            procedure, the patient's blood pressure, pulse, and                            oxygen saturations were monitored continuously. The                            [id number]-HQ190L ([id number]) scope was introduced through                            the anus and advanced to the the cecum, identified                            by appendiceal  orifice and ileocecal valve. The                            colonoscopy was performed without difficulty. The                            patient tolerated the procedure well. The quality                            of the bowel preparation was good. Scope In: 8:38:02 AM Scope Out: 8:59:20 AM Scope Withdrawal Time: 0 hours 15 minutes 58 seconds  Total Procedure Duration: 0 hours 21 minutes 18 seconds  Findings:      The perianal and digital rectal examinations were normal.      Two sessile polyps were found in the transverse colon and ascending       colon. The polyps were 3 to 8 mm in size. These polyps were removed with       a cold snare. Resection and retrieval were complete.      A 2 mm polyp was found in the transverse colon. The polyp was sessile.       The polyp was removed with a cold biopsy forceps. Resection and       retrieval were complete.      A 3 mm polyp was found in the sigmoid colon. The polyp was sessile. The       polyp was removed with a cold snare. Resection and retrieval were       complete.      Scattered small-mouthed diverticula were found in the sigmoid colon and       descending colon.      Non-bleeding internal hemorrhoids were found during retroflexion. The       hemorrhoids were small. Impression:               - Two 3 to 8 mm polyps in the transverse colon and                            in the ascending colon, removed with a cold snare.                            Resected and retrieved.                           -  One 2 mm polyp in the transverse colon, removed                            with a cold biopsy forceps. Resected and retrieved.                           - One 3 mm polyp in the sigmoid colon, removed with                            a cold snare. Resected and retrieved.                           - Diverticulosis in the sigmoid colon and in the                            descending colon.                           - Non-bleeding internal  hemorrhoids. Moderate Sedation:      Per Anesthesia Care Recommendation:           - Discharge patient to home (ambulatory).                           - Resume previous diet.                           - Await pathology results.                           - Repeat colonoscopy for surveillance based on                            pathology results. Procedure Code(s):        --- Professional ---                           [phone], Colonoscopy, flexible; with removal of                            tumor(s), polyp(s), or other lesion(s) by snare                            technique                           45380, 59, Colonoscopy, flexible; with biopsy,                            single or multiple Diagnosis Code(s):        --- Professional ---                           Z86.010, Personal history of colonic polyps                           D12.3, Benign neoplasm of  transverse colon (hepatic                            flexure or splenic flexure)                           D12.2, Benign neoplasm of ascending colon                           D12.5, Benign neoplasm of sigmoid colon                           K64.8, Other hemorrhoids                           K57.30, Diverticulosis of large intestine without                            perforation or abscess without bleeding CPT copyright 2022 American Medical Association. All rights reserved. The codes documented in this report are preliminary and upon coder review may  be revised to meet current compliance requirements. Katrinka Blazing, MD Katrinka Blazing,  10/19/2023 9:05:39 AM This report has been signed electronically. Number of Addenda: 0

## 2023-10-19 NOTE — Transfer of Care (Signed)
Immediate Anesthesia Transfer of Care Note  Patient: Ryan Fuller  Procedure(s) Performed: COLONOSCOPY WITH PROPOFOL POLYPECTOMY BIOPSY  Patient Location: Endoscopy Unit  Anesthesia Type:General  Level of Consciousness: drowsy  Airway & Oxygen Therapy: Patient Spontanous Breathing  Post-op Assessment: Report given to RN and Post -op Vital signs reviewed and stable  Post vital signs: Reviewed and stable  Last Vitals:  Vitals Value Taken Time  BP    Temp    Pulse    Resp    SpO2      Last Pain:  Vitals:   10/19/23 0834  TempSrc:   PainSc: 0-No pain      Patients Stated Pain Goal: 6 (10/19/23 0706)  Complications: No notable events documented.

## 2023-10-20 ENCOUNTER — Encounter (INDEPENDENT_AMBULATORY_CARE_PROVIDER_SITE_OTHER): Payer: Self-pay | Admitting: *Deleted

## 2023-10-20 LAB — SURGICAL PATHOLOGY

## 2023-10-22 ENCOUNTER — Other Ambulatory Visit (INDEPENDENT_AMBULATORY_CARE_PROVIDER_SITE_OTHER): Payer: Self-pay | Admitting: Gastroenterology

## 2023-10-26 DIAGNOSIS — L821 Other seborrheic keratosis: Secondary | ICD-10-CM | POA: Diagnosis not present

## 2023-10-26 DIAGNOSIS — L814 Other melanin hyperpigmentation: Secondary | ICD-10-CM | POA: Diagnosis not present

## 2023-10-27 ENCOUNTER — Encounter (HOSPITAL_COMMUNITY): Payer: Self-pay | Admitting: Gastroenterology

## 2023-10-27 ENCOUNTER — Encounter (INDEPENDENT_AMBULATORY_CARE_PROVIDER_SITE_OTHER): Payer: Self-pay | Admitting: *Deleted

## 2023-10-28 DIAGNOSIS — T50905A Adverse effect of unspecified drugs, medicaments and biological substances, initial encounter: Secondary | ICD-10-CM | POA: Diagnosis not present

## 2023-10-28 DIAGNOSIS — Z6832 Body mass index (BMI) 32.0-32.9, adult: Secondary | ICD-10-CM | POA: Diagnosis not present

## 2023-10-28 DIAGNOSIS — N522 Drug-induced erectile dysfunction: Secondary | ICD-10-CM | POA: Diagnosis not present

## 2023-10-28 DIAGNOSIS — G4730 Sleep apnea, unspecified: Secondary | ICD-10-CM | POA: Diagnosis not present

## 2023-10-28 DIAGNOSIS — G2581 Restless legs syndrome: Secondary | ICD-10-CM | POA: Diagnosis not present

## 2023-10-28 DIAGNOSIS — E6609 Other obesity due to excess calories: Secondary | ICD-10-CM | POA: Diagnosis not present

## 2023-10-28 DIAGNOSIS — M5134 Other intervertebral disc degeneration, thoracic region: Secondary | ICD-10-CM | POA: Diagnosis not present

## 2023-10-28 DIAGNOSIS — G894 Chronic pain syndrome: Secondary | ICD-10-CM | POA: Diagnosis not present

## 2023-10-31 ENCOUNTER — Encounter: Payer: Self-pay | Admitting: Neurology

## 2023-10-31 DIAGNOSIS — Z6832 Body mass index (BMI) 32.0-32.9, adult: Secondary | ICD-10-CM | POA: Diagnosis not present

## 2023-10-31 DIAGNOSIS — G894 Chronic pain syndrome: Secondary | ICD-10-CM | POA: Diagnosis not present

## 2023-10-31 DIAGNOSIS — E6609 Other obesity due to excess calories: Secondary | ICD-10-CM | POA: Diagnosis not present

## 2023-10-31 DIAGNOSIS — G2581 Restless legs syndrome: Secondary | ICD-10-CM | POA: Diagnosis not present

## 2023-10-31 DIAGNOSIS — N529 Male erectile dysfunction, unspecified: Secondary | ICD-10-CM | POA: Diagnosis not present

## 2023-10-31 DIAGNOSIS — R5383 Other fatigue: Secondary | ICD-10-CM | POA: Diagnosis not present

## 2023-10-31 DIAGNOSIS — G4700 Insomnia, unspecified: Secondary | ICD-10-CM | POA: Diagnosis not present

## 2023-11-03 ENCOUNTER — Other Ambulatory Visit (INDEPENDENT_AMBULATORY_CARE_PROVIDER_SITE_OTHER): Payer: Self-pay | Admitting: Gastroenterology

## 2023-12-19 ENCOUNTER — Encounter: Payer: Self-pay | Admitting: Neurology

## 2023-12-19 ENCOUNTER — Ambulatory Visit: Payer: Medicare HMO | Admitting: Neurology

## 2023-12-19 ENCOUNTER — Other Ambulatory Visit: Payer: Medicare HMO

## 2023-12-19 VITALS — BP 149/83 | HR 68 | Ht 72.0 in | Wt 253.0 lb

## 2023-12-19 DIAGNOSIS — G2581 Restless legs syndrome: Secondary | ICD-10-CM | POA: Diagnosis not present

## 2023-12-19 DIAGNOSIS — E569 Vitamin deficiency, unspecified: Secondary | ICD-10-CM | POA: Diagnosis not present

## 2023-12-19 DIAGNOSIS — Z5181 Encounter for therapeutic drug level monitoring: Secondary | ICD-10-CM | POA: Diagnosis not present

## 2023-12-19 DIAGNOSIS — G4761 Periodic limb movement disorder: Secondary | ICD-10-CM

## 2023-12-19 MED ORDER — CARBIDOPA-LEVODOPA 25-100 MG PO TABS: ORAL_TABLET | ORAL | 2 refills | Status: DC

## 2023-12-19 MED ORDER — PRAMIPEXOLE DIHYDROCHLORIDE 0.5 MG PO TABS: ORAL_TABLET | ORAL | 5 refills | Status: DC

## 2023-12-19 NOTE — Patient Instructions (Addendum)
Check ferritin Adjust mirapex to 0.25mg  at 4pm and 0.5mg  at bedtime.  If you symptoms get worse on the lower dose, go back to taking mirapex 0.5mg  at 4pm and bedtime For severe symptoms, take carbidopa-levadopa

## 2023-12-19 NOTE — Progress Notes (Signed)
[facility] Neurology Division Clinic Note - Initial Visit   Date: 12/19/2023   Ryan Fuller MRN: [id number] DOB: 09/25/1966   Dear Dr. Sherwood Gambler:  Thank you for your kind referral of Ryan Fuller for consultation of restless leg syndrome. Although his history is well known to you, please allow [place] to reiterate it for the purpose of our medical record. The patient was accompanied to the clinic by wife who also provides collateral information.     Ryan Fuller is a 58 y.o. right-handed male with GERD, s/p lumbar surgery, s/p cervical surgery and gout presenting for evaluation of restless leg syndrome.   IMPRESSION/PLAN: Restless leg syndrome.  Previously tried:  ropinirole, rotigotine, gabapentin Periodic limb movement disorder  Check ferritin Adjust mirapex to 0.25mg  at 4pm and 0.5mg  at bedtime to minimize side effects. For severe symptoms, ok to use carbidopa-levadopa prn  Return to clinic in 4 months  ------------------------------------------------------------- History of present illness: For the past 2-3 years, he reports having restless sensation predominately in the legs.  Symptoms at worse in the evening and at bedtime.  Wife also reports that he kicks his legs at lot at night.  She had a video recording of this.  He previously tried gabapentin, ropinirole, and neurpro.  He takes mirapex 0.5mg  at 5p and 0.5mg  at bedtime for the past several months, but reports that it makes him very sleepy and has erectile dysfunction.  His symptoms are well-controlled and he is not waking up or kicking his legs as much as night.    He is retired and lives with wife.  He used to work for [employer].    Past Medical History:  Diagnosis Date   Dysphagia 08/10/2017   Gout    Gout 08/10/2017   Iritis    dx by Dr. Charise Killian   Kidney stones     Past Surgical History:  Procedure Laterality Date   BACK SURGERY     BIOPSY  11/13/2018   Procedure: BIOPSY;  Surgeon: Malissa Hippo, MD;  Location: [facility];  Service: Endoscopy;;  terminal ileum colon   BIOPSY  05/20/2023   Procedure: BIOPSY;  Surgeon: Dolores Frame, MD;  Location: [facility];  Service: Gastroenterology;;   BIOPSY  10/19/2023   Procedure: BIOPSY;  Surgeon: Dolores Frame, MD;  Location: [facility];  Service: Gastroenterology;;   COLONOSCOPY N/A 03/01/2013   Procedure: COLONOSCOPY;  Surgeon: Malissa Hippo, MD;  Location: [facility];  Service: Endoscopy;  Laterality: N/A;  100   COLONOSCOPY N/A 11/13/2018   Procedure: COLONOSCOPY;  Surgeon: Malissa Hippo, MD;  Location: [facility];  Service: Endoscopy;  Laterality: N/A;  1015am   COLONOSCOPY WITH PROPOFOL N/A 10/19/2023   Procedure: COLONOSCOPY WITH PROPOFOL;  Surgeon: Dolores Frame, MD;  [facility]: [facility];  Service: Gastroenterology;  Laterality: N/A;  8:15am;asa 2   EGD/ED     years ago   ESOPHAGEAL DILATION N/A 08/31/2017   Procedure: ESOPHAGEAL DILATION;  Surgeon: Malissa Hippo, MD;  [facility]: [facility];  Service: Endoscopy;  Laterality: N/A;   ESOPHAGEAL DILATION N/A 05/20/2023   Procedure: ESOPHAGEAL DILATION;  Surgeon: Dolores Frame, MD;  [facility]: [facility];  Service: Gastroenterology;  Laterality: N/A;  10:15am;asa 2   ESOPHAGOGASTRODUODENOSCOPY N/A 08/31/2017   Procedure: ESOPHAGOGASTRODUODENOSCOPY (EGD);  Surgeon: Malissa Hippo, MD;  [facility]: [facility];  Service: Endoscopy;  Laterality: N/A;  3:10   ESOPHAGOGASTRODUODENOSCOPY (EGD) WITH PROPOFOL  N/A 05/20/2023   Procedure: ESOPHAGOGASTRODUODENOSCOPY (EGD) WITH PROPOFOL;  Surgeon: Dolores Frame, MD;  [facility];  Service: Gastroenterology;  Laterality: N/A;  10:15am;asa 2   NECK SURGERY     x2   POLYPECTOMY  11/13/2018   Procedure: POLYPECTOMY;  Surgeon: Malissa Hippo, MD;  [facility];  Service: Endoscopy;;  colon   POLYPECTOMY  10/19/2023   Procedure:  POLYPECTOMY;  Surgeon: Dolores Frame, MD;  [facility];  Service: Gastroenterology;;   ROTATOR CUFF REPAIR Left    Rotator cuff surgery Right      Medications:  Outpatient Encounter Medications as of 12/19/2023  Medication Sig   allopurinol (ZYLOPRIM) 100 MG tablet Take 100 mg by mouth 2 (two) times daily.    famotidine (PEPCID) 20 MG tablet TAKE ONE TABLET BY MOUTH ONCE DAILY.   ibuprofen (ADVIL) 200 MG tablet Take 400-600 mg by mouth every 8 (eight) hours as needed (pain.).   omeprazole (PRILOSEC) 40 MG capsule TAKE ONE CAPSULE BY MOUTH ONCE DAILY.   polyethylene glycol (MIRALAX / GLYCOLAX) packet Take 34 g by mouth daily. w/Coffee   pramipexole (MIRAPEX) 1 MG tablet Take 1 mg by mouth at bedtime.   Psyllium (METAMUCIL FREE & NATURAL PO) Take 2 scoop by mouth at bedtime.   No facility-administered encounter medications on file as of 12/19/2023.    Allergies:  Allergies  Allergen Reactions   Phenergan [Promethazine Hcl] Anaphylaxis and Other (See Comments)    severe hypotension   Colchicine Other (See Comments)    Depression     Penicillins Other (See Comments)    From childhood Has patient had a PCN reaction causing immediate rash, facial/tongue/throat swelling, SOB or lightheadedness with hypotension: Unknown Has patient had a PCN reaction causing severe rash involving mucus membranes or skin necrosis: Unknown Has patient had a PCN reaction that required hospitalization Unknown Has patient had a PCN reaction occurring within the last 10 years: Unknown If all of the above answers are "NO", then may proceed with Cephalosporin [place]   Vancomycin Swelling and Other (See Comments)    Lips swelling    Family History: Family History  Problem Relation Age of Onset   Dementia Mother    Kidney cancer Father    Kidney failure Brother    Congestive Heart Failure Brother    Healthy Brother    Heart Problems Son    Healthy Son    Healthy Daughter     Social  History: Social History   Tobacco Use   Smoking status: Never    Passive exposure: Past   Smokeless tobacco: Never  Vaping Use   Vaping status: Never Used  Substance Use Topics   Alcohol use: No   Drug use: No   Social History   Social History Narrative   Are you right handed or left handed? Right handed   Are you currently employed ? No   What is your current occupation?   Do you live at home alone? No   Who lives with you? With wife    What type of home do you live in: 1 story or 2 story? Lives in a one story home        Vital Signs:  BP (!) 149/83   Pulse 68   Ht 6' (1.829 m)   Wt 253 lb (114.8 kg)   SpO2 97%   BMI 34.31 kg/m   Neurological Exam: MENTAL STATUS including orientation to time, place, person, recent  and remote memory, attention span and concentration, language, and fund of knowledge is normal.  Speech is not dysarthric.  CRANIAL NERVES: II:  No visual field defects.     III-IV-VI: Pupils equal round and reactive to light.  Normal conjugate, extra-ocular eye movements in all directions of gaze.  No nystagmus.  No ptosis.   V:  Normal facial sensation.    VII:  Normal facial symmetry and movements.   VIII:  Normal hearing and vestibular function.   IX-X:  Normal palatal movement.   XI:  Normal shoulder shrug and head rotation.   XII:  Normal tongue strength and range of motion, no deviation or fasciculation.  MOTOR:  Motor strength is 5/5 throughout. No atrophy, fasciculations or abnormal movements.  No pronator drift.   MSRs:                                           Right        Left brachioradialis 2+  2+  biceps 2+  2+  triceps 2+  2+  patellar 2+  2+  ankle jerk 2+  2+  Hoffman no  no  plantar response down  down   SENSORY:  Normal and symmetric perception of light touch and vibration.   COORDINATION/GAIT: Normal finger-to- nose-finger.  Intact rapid alternating movements bilaterally.  Able to rise from a chair without using arms.  Gait  narrow based and stable.   Thank you for allowing me to participate in patient's care.  If I can answer any additional questions, I would be pleased to do so.    Sincerely,    Aiyah Scarpelli K. Allena Katz, DO

## 2023-12-20 LAB — FERRITIN: Ferritin: 268 ng/mL (ref 38–380)

## 2023-12-26 ENCOUNTER — Other Ambulatory Visit (INDEPENDENT_AMBULATORY_CARE_PROVIDER_SITE_OTHER): Payer: Self-pay

## 2023-12-26 ENCOUNTER — Ambulatory Visit: Payer: Medicare HMO | Admitting: Orthopedic Surgery

## 2023-12-26 ENCOUNTER — Encounter: Payer: Self-pay | Admitting: Orthopedic Surgery

## 2023-12-26 DIAGNOSIS — M170 Bilateral primary osteoarthritis of knee: Secondary | ICD-10-CM

## 2023-12-26 DIAGNOSIS — M25532 Pain in left wrist: Secondary | ICD-10-CM | POA: Diagnosis not present

## 2023-12-26 DIAGNOSIS — M545 Low back pain, unspecified: Secondary | ICD-10-CM

## 2023-12-26 DIAGNOSIS — M25561 Pain in right knee: Secondary | ICD-10-CM

## 2023-12-26 DIAGNOSIS — M25562 Pain in left knee: Secondary | ICD-10-CM

## 2023-12-26 NOTE — Progress Notes (Signed)
Office Visit Note   Patient: Ryan Fuller           Date of Birth: 1966/02/14           MRN: [id number] Visit Date: 12/26/2023 Requested by: Assunta Found, MD [address] [place],  [place] [address] PCP: Assunta Found, MD  Subjective: Chief Complaint  Patient presents with   Left Knee - Pain    HPI: Ryan Fuller is a 58 y.o. male who presents to the office reporting bilateral knee pain for several years.  Patient states that he "messed up my knees when I was younger".  No prior surgery.  Denies any locking but he does report some popping in the knees.  Painful to weight-bear.  He also describes low back pain with buttock pain as well as some right leg radicular symptoms.  Has a history of lumbar decompressive surgery which was complicated by MRSA.  That was treated by Dr. Channing Mutters.  He does not report any recent fevers or chills.  Hard for him to get up from a seated position.  Just reports pain in the back more than the knees.  His back is more symptomatic than the knees at this time.  He did have an MRI scan of his lumbar spine in 2020.  He states that both of his feet are numb but that has been going on for a long time.  He has tried exercises from his prior surgeries as well as activity modification as well as over-the-counter medication without relief..                ROS: All systems reviewed are negative as they relate to the chief complaint within the history of present illness.  Patient denies fevers or chills.  Assessment & Plan: Visit Diagnoses:  1. Left knee pain, unspecified chronicity   2. Right knee pain, unspecified chronicity   3. Lumbar pain     Plan: Impression is bilateral knee arthritis which is worse on the right side than the left.  Does not look like it is knee replacement territory yet.  Does have some varus alignment.  Bilateral knee injections performed today.  Regarding the back that is a more complicated issue.  He has some appropriate postsurgical  degenerative changes in the lumbar spine but no evidence of recurrent infection.  However with his significant pain that would always be a consideration.  Needs more recent imaging of his lumbar spine to evaluate for treatable pathology from [facility] versus occult recurrent infection.  MRI lumbar spine requested.  We will follow-up after those studies.  Follow-Up Instructions: No follow-ups on file.   Orders:  Orders Placed This Encounter  Procedures   XR KNEE 3 VIEW LEFT   XR Lumbar Spine 2-3 Views   XR KNEE 3 VIEW RIGHT   MR Lumbar Spine w/o contrast   No orders of the defined types were placed in this encounter.     Procedures: Large Joint Inj: bilateral knee on 12/26/2023 7:53 PM Indications: diagnostic evaluation, joint swelling and pain Details: 18 G 1.5 in needle, superolateral approach  Arthrogram: No  Medications (Right): 5 mL lidocaine 1 %; 4 mL bupivacaine 0.25 %; 40 mg methylPREDNISolone acetate 40 MG/ML Medications (Left): 5 mL lidocaine 1 %; 4 mL bupivacaine 0.25 %; 40 mg methylPREDNISolone acetate 40 MG/ML Outcome: tolerated well, no immediate complications Procedure, treatment alternatives, risks and benefits explained, specific risks discussed. Consent was given by the patient. Immediately prior to procedure a time  out was called to verify the correct patient, procedure, equipment, support staff and site/side marked as required. Patient was prepped and draped in the usual sterile fashion.       Clinical Data: No additional findings.  Objective: Vital Signs: There were no vitals taken for this visit.  Physical Exam:  Constitutional: Patient appears well-developed HEENT:  Head: Normocephalic Eyes:EOM are normal Neck: Normal range of motion Cardiovascular: Normal rate Pulmonary/chest: Effort normal Neurologic: Patient is alert Skin: Skin is warm Psychiatric: Patient has normal mood and affect  Ortho Exam: Ortho exam demonstrates 5 degree flexion contracture  in both knees.  Extensor mechanism intact.  Slight varus alignment present.  Pedal pulses palpable.  Range of motion otherwise intact.  Has medial greater than lateral joint line tenderness more on the right knee than the left knee.  McMurray compression testing equivocal in both knees.  No groin pain with internal/external rotation of the leg on either side but is internal rotation is slightly restricted bilaterally to about 20 to 25 degrees.  No pain with circumduction of the hip.  No paresthesias L1 S1 bilaterally.  Well-healed surgical incision on his back with no trochanteric tenderness noted.  Pain with forward lateral bending present.  He is got mildly positive nerve root tension signs on the right negative on the left.  Specialty Comments:  03/17/2022 EMG/NCS Impression: The above electrodiagnostic study is ABNORMAL and reveals evidence of a moderate Bilateral median nerve entrapment at the wrist (carpal tunnel syndrome) affecting sensory and motor components.    There is no significant electrodiagnostic evidence of any other focal nerve entrapment, brachial plexopathy or cervical radiculopathy.    Recommendations: 1.  Follow-up with referring physician. 2.  Continue current management of symptoms. 3.  Continue use of resting splint at night-time and as needed during the day. 4.  Suggest surgical evaluation.  Elisabeth Most, MD ------- MRI CERVICAL SPINE WITHOUT CONTRAST   TECHNIQUE: Multiplanar, multisequence MR imaging of the cervical spine was performed. No intravenous contrast was administered.   COMPARISON:  Previous MRI from 10/16/2021.   FINDINGS: Alignment: Straightening of the normal cervical lordosis. No significant listhesis.   Vertebrae: Postoperative changes from prior fusion at C4 through C7, with anterior plate screw fixation in place at C6-7. Vertebral body height maintained without acute or chronic fracture. Bone marrow signal intensity heterogeneous but overall  within normal limits. No worrisome osseous lesions. No abnormal marrow edema.   Cord: Normal signal and morphology.   Posterior Fossa, vertebral arteries, paraspinal tissues: Visualized brain and posterior fossa within normal limits. Craniocervical junction normal. Mild soft tissue edema adjacent to the C7 spinous process, most commonly seen with bursitis (series 7, image 10). Paraspinous soft tissues demonstrate no other acute finding. Normal flow voids seen within the vertebral arteries bilaterally.   Disc levels:   C2-C3: Normal interspace. Mild bilateral facet hypertrophy. Small extraforaminal synovial cyst again noted on the left, stable (series 8, image 6). No spinal stenosis. Foramina remain patent.   C3-C4: Mild disc bulge with uncovertebral spurring. Mild bilateral facet and ligament flavum hypertrophy. No spinal stenosis. Mild bilateral foraminal narrowing, stable.   C4-C5: Prior fusion. No residual spinal stenosis. Foramina remain patent.   C5-C6: Prior fusion. No residual spinal stenosis. Right worse than left uncovertebral and facet hypertrophy with residual moderate right C6 foraminal narrowing. Left neural foramen remains patent.   C6-C7: Prior fusion. No residual spinal stenosis. Uncovertebral spurring with residual mild left C7 foraminal stenosis. Right neural foramen remains patent.  C7-T1: Small central to right paracentral disc protrusion with annular fissure indents the ventral thecal sac (series 8, image 33). Mild facet hypertrophy. No significant spinal stenosis. Foramina remain patent.   Note made of mild noncompressive disc bulging at the T2-3 level without stenosis.   IMPRESSION: 1. Prior fusion at C4 through C7 without residual spinal stenosis. Residual moderate right C6 and mild left C7 foraminal narrowing related to uncovertebral and facet disease. Overall appearance is stable. 2. Small central to right paracentral disc protrusion at  C7-T1 without significant stenosis. This is slightly decreased in size from prior. 3. Mild bilateral foraminal narrowing at C3-4 related to disc bulge and uncovertebral disease, stable. 4. Mild soft tissue edema about the C7 spinous process, most commonly seen in the setting of bursitis.     Electronically Signed   By: Rise Mu M.D.   On: 11/08/2022 02:24  Imaging: No results found.   PMFS History: Patient Active Problem List   Diagnosis Date Noted   History of colonic polyps 10/19/2023   Chest pain 05/20/2023   DDD (degenerative disc disease), cervical 02/04/2022   DDD (degenerative disc disease), lumbar 02/04/2022   Primary osteoarthritis of both hands 02/04/2022   Arthritis of both acromioclavicular joints 02/04/2022   GERD (gastroesophageal reflux disease) 09/02/2021   Sigmoid diverticulitis 10/01/2019   Family hx of colon cancer 10/10/2018   Rectal bleeding 10/10/2018   History of colon polyps 10/10/2018   Dysphagia 08/10/2017   Gout 08/10/2017   Constipation 02/21/2013   Past Medical History:  Diagnosis Date   Dysphagia 08/10/2017   Gout    Gout 08/10/2017   Iritis    dx by Dr. Charise Killian   Kidney stones     Family History  Problem Relation Age of Onset   Dementia Mother    Kidney cancer Father    Kidney failure Brother    Congestive Heart Failure Brother    Healthy Brother    Heart Problems Son    Healthy Son    Healthy Daughter     Past Surgical History:  Procedure Laterality Date   BACK SURGERY     BIOPSY  11/13/2018   Procedure: BIOPSY;  Surgeon: Malissa Hippo, MD;  [facility];  Service: Endoscopy;;  terminal ileum colon   BIOPSY  05/20/2023   Procedure: BIOPSY;  Surgeon: Dolores Frame, MD;  [facility];  Service: Gastroenterology;;   BIOPSY  10/19/2023   Procedure: BIOPSY;  Surgeon: Dolores Frame, MD;  [facility];  Service: Gastroenterology;;   COLONOSCOPY N/A 03/01/2013    Procedure: COLONOSCOPY;  Surgeon: Malissa Hippo, MD;  [facility];  Service: Endoscopy;  Laterality: N/A;  100   COLONOSCOPY N/A 11/13/2018   Procedure: COLONOSCOPY;  Surgeon: Malissa Hippo, MD;  [facility];  Service: Endoscopy;  Laterality: N/A;  1015am   COLONOSCOPY WITH PROPOFOL N/A 10/19/2023   Procedure: COLONOSCOPY WITH PROPOFOL;  Surgeon: Dolores Frame, MD;  [facility];  Service: Gastroenterology;  Laterality: N/A;  8:15am;asa 2   EGD/ED     years ago   ESOPHAGEAL DILATION N/A 08/31/2017   Procedure: ESOPHAGEAL DILATION;  Surgeon: Malissa Hippo, MD;  [facility];  Service: Endoscopy;  Laterality: N/A;   ESOPHAGEAL DILATION N/A 05/20/2023   Procedure: ESOPHAGEAL DILATION;  Surgeon: Dolores Frame, MD;  [facility];  Service: Gastroenterology;  Laterality: N/A;  10:15am;asa 2   ESOPHAGOGASTRODUODENOSCOPY N/A  08/31/2017   Procedure: ESOPHAGOGASTRODUODENOSCOPY (EGD);  Surgeon: Malissa Hippo, MD;  [facility];  Service: Endoscopy;  Laterality: N/A;  3:10   ESOPHAGOGASTRODUODENOSCOPY (EGD) WITH PROPOFOL N/A 05/20/2023   Procedure: ESOPHAGOGASTRODUODENOSCOPY (EGD) WITH PROPOFOL;  Surgeon: Dolores Frame, MD;  [facility];  Service: Gastroenterology;  Laterality: N/A;  10:15am;asa 2   NECK SURGERY     x2   POLYPECTOMY  11/13/2018   Procedure: POLYPECTOMY;  Surgeon: Malissa Hippo, MD;  [facility];  Service: Endoscopy;;  colon   POLYPECTOMY  10/19/2023   Procedure: POLYPECTOMY;  Surgeon: Dolores Frame, MD;  [facility];  Service: Gastroenterology;;   ROTATOR CUFF REPAIR Left    Rotator cuff surgery Right    Social History   Occupational History   Not on file  Tobacco Use   Smoking status: Never    Passive exposure: Past   Smokeless tobacco: Never  Vaping Use   Vaping status: Never Used  Substance and Sexual Activity    Alcohol use: No   Drug use: No   Sexual activity: Not on file

## 2023-12-27 ENCOUNTER — Encounter: Payer: Self-pay | Admitting: Orthopedic Surgery

## 2023-12-28 MED ORDER — BUPIVACAINE HCL 0.25 % IJ SOLN
4.0000 mL | INTRAMUSCULAR | Status: AC | PRN
  Administered 2023-12-26: 4 mL via INTRA_ARTICULAR

## 2023-12-28 MED ORDER — LIDOCAINE HCL 1 % IJ SOLN
5.0000 mL | INTRAMUSCULAR | Status: AC | PRN
  Administered 2023-12-26: 5 mL

## 2023-12-28 MED ORDER — METHYLPREDNISOLONE ACETATE 40 MG/ML IJ SUSP
40.0000 mg | INTRAMUSCULAR | Status: AC | PRN
  Administered 2023-12-26: 40 mg via INTRA_ARTICULAR

## 2023-12-29 DIAGNOSIS — T50905A Adverse effect of unspecified drugs, medicaments and biological substances, initial encounter: Secondary | ICD-10-CM | POA: Diagnosis not present

## 2023-12-29 DIAGNOSIS — G2581 Restless legs syndrome: Secondary | ICD-10-CM | POA: Diagnosis not present

## 2023-12-29 DIAGNOSIS — N529 Male erectile dysfunction, unspecified: Secondary | ICD-10-CM | POA: Diagnosis not present

## 2023-12-29 DIAGNOSIS — G4700 Insomnia, unspecified: Secondary | ICD-10-CM | POA: Diagnosis not present

## 2023-12-29 DIAGNOSIS — M5134 Other intervertebral disc degeneration, thoracic region: Secondary | ICD-10-CM | POA: Diagnosis not present

## 2023-12-29 DIAGNOSIS — Z6833 Body mass index (BMI) 33.0-33.9, adult: Secondary | ICD-10-CM | POA: Diagnosis not present

## 2023-12-29 DIAGNOSIS — N522 Drug-induced erectile dysfunction: Secondary | ICD-10-CM | POA: Diagnosis not present

## 2023-12-29 DIAGNOSIS — M67441 Ganglion, right hand: Secondary | ICD-10-CM | POA: Diagnosis not present

## 2023-12-29 DIAGNOSIS — M159 Polyosteoarthritis, unspecified: Secondary | ICD-10-CM | POA: Diagnosis not present

## 2023-12-31 ENCOUNTER — Other Ambulatory Visit: Payer: Self-pay | Admitting: Orthopedic Surgery

## 2023-12-31 ENCOUNTER — Ambulatory Visit
Admission: RE | Admit: 2023-12-31 | Discharge: 2023-12-31 | Disposition: A | Discharge status: Home / Self Care | Payer: Medicare HMO | Source: Ambulatory Visit | Attending: Orthopedic Surgery | Admitting: Orthopedic Surgery

## 2023-12-31 DIAGNOSIS — M545 Low back pain, unspecified: Secondary | ICD-10-CM

## 2024-01-02 ENCOUNTER — Ambulatory Visit
Admission: RE | Admit: 2024-01-02 | Discharge: 2024-01-02 | Disposition: A | Discharge status: Home / Self Care | Payer: Medicare HMO | Source: Ambulatory Visit | Attending: Orthopedic Surgery | Admitting: Orthopedic Surgery

## 2024-01-02 DIAGNOSIS — M545 Low back pain, unspecified: Secondary | ICD-10-CM

## 2024-01-02 DIAGNOSIS — M5126 Other intervertebral disc displacement, lumbar region: Secondary | ICD-10-CM | POA: Diagnosis not present

## 2024-01-02 DIAGNOSIS — M4316 Spondylolisthesis, lumbar region: Secondary | ICD-10-CM | POA: Diagnosis not present

## 2024-01-02 DIAGNOSIS — M47816 Spondylosis without myelopathy or radiculopathy, lumbar region: Secondary | ICD-10-CM | POA: Diagnosis not present

## 2024-01-02 DIAGNOSIS — Z0189 Encounter for other specified special examinations: Secondary | ICD-10-CM | POA: Diagnosis not present

## 2024-01-11 ENCOUNTER — Ambulatory Visit (INDEPENDENT_AMBULATORY_CARE_PROVIDER_SITE_OTHER): Payer: Medicare HMO | Admitting: Orthopedic Surgery

## 2024-01-11 DIAGNOSIS — M545 Low back pain, unspecified: Secondary | ICD-10-CM

## 2024-01-12 ENCOUNTER — Encounter: Payer: Self-pay | Admitting: Orthopedic Surgery

## 2024-01-12 NOTE — Progress Notes (Signed)
 Office Visit Note   Patient: Ryan Fuller           Date of Birth: 1966-06-11           MRN: [id number] Visit Date: 01/11/2024 Requested by: Assunta Found, MD [address] [place],  [place] [address] PCP: Assunta Found, MD  Subjective: Chief Complaint  Patient presents with   Lower Back - Pain    HPI: Ryan Fuller is a 58 y.o. male who presents to the office reporting bilateral foot numbness as well as left radicular symptoms and dragging of the right foot.  Since he was last seen has had an MRI scan of his lumbar spine.  Patient has a history of no instrumentation in his back but did have back surgery and there is a questionable history of MRSA infection treated by Dr. Channing Mutters.  No records available from that.  MRI scan does show degenerative pathology no hardware and possible new left-sided disc pathology which may be getting him his symptoms..                ROS: All systems reviewed are negative as they relate to the chief complaint within the history of present illness.  Patient denies fevers or chills.  Assessment & Plan: Visit Diagnoses:  1. Lumbar pain     Plan: Impression is bilateral lower extremity pain which is becoming severe in a patient with a history of surgery and infection in his lumbar spine.  Plan at this time is referral to Dr. Christell Constant.  Really the question to ask is whether or not epidural steroid injections are indicated in this case with prior infection in the back and whether or not they would be effective.  The patient is slightly reluctant to try injections for this problem but is not sure if this is a surgical problem yet.  I think Dr. Christell Constant can get a better opinion on the chances of surgical improvement for this problem in the face of prior surgery.  Follow-Up Instructions: No follow-ups on file.   Orders:  Orders Placed This Encounter  Procedures   Ambulatory referral to Orthopedic Surgery   No orders of the defined types were placed in this  encounter.     Procedures: No procedures performed   Clinical Data: No additional findings.  Objective: Vital Signs: There were no vitals taken for this visit.  Physical Exam:  Constitutional: Patient appears well-developed HEENT:  Head: Normocephalic Eyes:EOM are normal Neck: Normal range of motion Cardiovascular: Normal rate Pulmonary/chest: Effort normal Neurologic: Patient is alert Skin: Skin is warm Psychiatric: Patient has normal mood and affect  Ortho Exam: Ortho exam demonstrates pretty reasonable ankle dorsiflexion plantarflexion strength.  No nerve root tension signs.  Has 5 out of 5 hip flexion strength as well as abduction and adduction quad and hamstring as well.  Mild pain with forward lateral bending.  Back incision unchanged from prior visit.  Specialty Comments:  03/17/2022 EMG/NCS Impression: The above electrodiagnostic study is ABNORMAL and reveals evidence of a moderate Bilateral median nerve entrapment at the wrist (carpal tunnel syndrome) affecting sensory and motor components.    There is no significant electrodiagnostic evidence of any other focal nerve entrapment, brachial plexopathy or cervical radiculopathy.    Recommendations: 1.  Follow-up with referring physician. 2.  Continue current management of symptoms. 3.  Continue use of resting splint at night-time and as needed during the day. 4.  Suggest surgical evaluation.  Elisabeth Most, MD ------- MRI CERVICAL  SPINE WITHOUT CONTRAST   TECHNIQUE: Multiplanar, multisequence MR imaging of the cervical spine was performed. No intravenous contrast was administered.   COMPARISON:  Previous MRI from 10/16/2021.   FINDINGS: Alignment: Straightening of the normal cervical lordosis. No significant listhesis.   Vertebrae: Postoperative changes from prior fusion at C4 through C7, with anterior plate screw fixation in place at C6-7. Vertebral body height maintained without acute or chronic  fracture. Bone marrow signal intensity heterogeneous but overall within normal limits. No worrisome osseous lesions. No abnormal marrow edema.   Cord: Normal signal and morphology.   Posterior Fossa, vertebral arteries, paraspinal tissues: Visualized brain and posterior fossa within normal limits. Craniocervical junction normal. Mild soft tissue edema adjacent to the C7 spinous process, most commonly seen with bursitis (series 7, image 10). Paraspinous soft tissues demonstrate no other acute finding. Normal flow voids seen within the vertebral arteries bilaterally.   Disc levels:   C2-C3: Normal interspace. Mild bilateral facet hypertrophy. Small extraforaminal synovial cyst again noted on the left, stable (series 8, image 6). No spinal stenosis. Foramina remain patent.   C3-C4: Mild disc bulge with uncovertebral spurring. Mild bilateral facet and ligament flavum hypertrophy. No spinal stenosis. Mild bilateral foraminal narrowing, stable.   C4-C5: Prior fusion. No residual spinal stenosis. Foramina remain patent.   C5-C6: Prior fusion. No residual spinal stenosis. Right worse than left uncovertebral and facet hypertrophy with residual moderate right C6 foraminal narrowing. Left neural foramen remains patent.   C6-C7: Prior fusion. No residual spinal stenosis. Uncovertebral spurring with residual mild left C7 foraminal stenosis. Right neural foramen remains patent.   C7-T1: Small central to right paracentral disc protrusion with annular fissure indents the ventral thecal sac (series 8, image 33). Mild facet hypertrophy. No significant spinal stenosis. Foramina remain patent.   Note made of mild noncompressive disc bulging at the T2-3 level without stenosis.   IMPRESSION: 1. Prior fusion at C4 through C7 without residual spinal stenosis. Residual moderate right C6 and mild left C7 foraminal narrowing related to uncovertebral and facet disease. Overall appearance  is stable. 2. Small central to right paracentral disc protrusion at C7-T1 without significant stenosis. This is slightly decreased in size from prior. 3. Mild bilateral foraminal narrowing at C3-4 related to disc bulge and uncovertebral disease, stable. 4. Mild soft tissue edema about the C7 spinous process, most commonly seen in the setting of bursitis.     Electronically Signed   By: Rise Mu M.D.   On: 11/08/2022 02:24  Imaging: No results found.   PMFS History: Patient Active Problem List   Diagnosis Date Noted   History of colonic polyps 10/19/2023   Chest pain 05/20/2023   DDD (degenerative disc disease), cervical 02/04/2022   DDD (degenerative disc disease), lumbar 02/04/2022   Primary osteoarthritis of both hands 02/04/2022   Arthritis of both acromioclavicular joints 02/04/2022   GERD (gastroesophageal reflux disease) 09/02/2021   Sigmoid diverticulitis 10/01/2019   Family hx of colon cancer 10/10/2018   Rectal bleeding 10/10/2018   History of colon polyps 10/10/2018   Dysphagia 08/10/2017   Gout 08/10/2017   Constipation 02/21/2013   Past Medical History:  Diagnosis Date   Dysphagia 08/10/2017   Gout    Gout 08/10/2017   Iritis    dx by Dr. Charise Killian   Kidney stones     Family History  Problem Relation Age of Onset   Dementia Mother    Kidney cancer Father    Kidney failure Brother    Congestive  Heart Failure Brother    Healthy Brother    Heart Problems Son    Healthy Son    Healthy Daughter     Past Surgical History:  Procedure Laterality Date   BACK SURGERY     BIOPSY  11/13/2018   Procedure: BIOPSY;  Surgeon: Malissa Hippo, MD;  [facility];  Service: Endoscopy;;  terminal ileum colon   BIOPSY  05/20/2023   Procedure: BIOPSY;  Surgeon: Dolores Frame, MD;  [facility];  Service: Gastroenterology;;   BIOPSY  10/19/2023   Procedure: BIOPSY;  Surgeon: Dolores Frame, MD;  [facility];  Service: Gastroenterology;;   COLONOSCOPY N/A 03/01/2013   Procedure: COLONOSCOPY;  Surgeon: Malissa Hippo, MD;  [facility];  Service: Endoscopy;  Laterality: N/A;  100   COLONOSCOPY N/A 11/13/2018   Procedure: COLONOSCOPY;  Surgeon: Malissa Hippo, MD;  [facility];  Service: Endoscopy;  Laterality: N/A;  1015am   COLONOSCOPY WITH PROPOFOL N/A 10/19/2023   Procedure: COLONOSCOPY WITH PROPOFOL;  Surgeon: Dolores Frame, MD;  [facility];  Service: Gastroenterology;  Laterality: N/A;  8:15am;asa 2   EGD/ED     years ago   ESOPHAGEAL DILATION N/A 08/31/2017   Procedure: ESOPHAGEAL DILATION;  Surgeon: Malissa Hippo, MD;  [facility];  Service: Endoscopy;  Laterality: N/A;   ESOPHAGEAL DILATION N/A 05/20/2023   Procedure: ESOPHAGEAL DILATION;  Surgeon: Dolores Frame, MD;  [facility];  Service: Gastroenterology;  Laterality: N/A;  10:15am;asa 2   ESOPHAGOGASTRODUODENOSCOPY N/A 08/31/2017   Procedure: ESOPHAGOGASTRODUODENOSCOPY (EGD);  Surgeon: Malissa Hippo, MD;  [facility];  Service: Endoscopy;  Laterality: N/A;  3:10   ESOPHAGOGASTRODUODENOSCOPY (EGD) WITH PROPOFOL N/A 05/20/2023   Procedure: ESOPHAGOGASTRODUODENOSCOPY (EGD) WITH PROPOFOL;  Surgeon: Dolores Frame, MD;  [facility];  Service: Gastroenterology;  Laterality: N/A;  10:15am;asa 2   NECK SURGERY     x2   POLYPECTOMY  11/13/2018   Procedure: POLYPECTOMY;  Surgeon: Malissa Hippo, MD;  [facility];  Service: Endoscopy;;  colon   POLYPECTOMY  10/19/2023   Procedure: POLYPECTOMY;  Surgeon: Dolores Frame, MD;  [facility];  Service: Gastroenterology;;   ROTATOR CUFF REPAIR Left    Rotator cuff surgery Right    Social History   Occupational History   Not on file  Tobacco Use   Smoking status: Never    Passive exposure: Past   Smokeless tobacco: Never   Vaping Use   Vaping status: Never Used  Substance and Sexual Activity   Alcohol use: No   Drug use: No   Sexual activity: Not on file

## 2024-01-12 NOTE — Progress Notes (Signed)
 I called.

## 2024-01-18 ENCOUNTER — Ambulatory Visit: Payer: Medicare HMO | Admitting: Urology

## 2024-01-18 ENCOUNTER — Encounter: Payer: Self-pay | Admitting: Urology

## 2024-01-18 VITALS — BP 135/77 | HR 84

## 2024-01-18 DIAGNOSIS — N529 Male erectile dysfunction, unspecified: Secondary | ICD-10-CM

## 2024-01-18 DIAGNOSIS — N4889 Other specified disorders of penis: Secondary | ICD-10-CM | POA: Diagnosis not present

## 2024-01-18 DIAGNOSIS — R339 Retention of urine, unspecified: Secondary | ICD-10-CM | POA: Diagnosis not present

## 2024-01-18 LAB — URINALYSIS, ROUTINE W REFLEX MICROSCOPIC
Bilirubin, UA: NEGATIVE
Glucose, UA: NEGATIVE
Ketones, UA: NEGATIVE
Leukocytes,UA: NEGATIVE
Nitrite, UA: NEGATIVE
Protein,UA: NEGATIVE
RBC, UA: NEGATIVE
Specific Gravity, UA: 1.015 (ref 1.005–1.030)
Urobilinogen, Ur: 0.2 mg/dL (ref 0.2–1.0)
pH, UA: 6 (ref 5.0–7.5)

## 2024-01-18 LAB — BLADDER SCAN AMB NON-IMAGING: Scan Result: 201

## 2024-01-18 MED ORDER — TADALAFIL 5 MG PO TABS: 5.0000 mg | ORAL_TABLET | Freq: Every day | ORAL | 11 refills | Status: DC

## 2024-01-18 NOTE — Patient Instructions (Signed)
 Erectile Dysfunction Erectile dysfunction (ED) is the inability to get or keep an erection in order to have sexual intercourse. ED is considered a symptom of an underlying disorder and is not considered a disease. ED may include: Inability to get an erection. Lack of enough hardness of the erection to allow penetration. Loss of erection before sex is finished. What are the causes? This condition may be caused by: Physical causes, such as: Artery problems. This may include heart disease, high blood pressure, atherosclerosis, and diabetes. Hormonal problems, such as low testosterone. Obesity. Nerve problems. This may include back or pelvic injuries, multiple sclerosis, Parkinson's disease, spinal cord injury, and stroke. Certain medicines, such as: Pain relievers. Antidepressants. Blood pressure medicines and water pills (diuretics). Cancer medicines. Antihistamines. Muscle relaxants. Lifestyle factors, such as: Use of drugs such as marijuana, cocaine, or opioids. Excessive use of alcohol. Smoking. Lack of physical activity or exercise. Psychological causes, such as: Anxiety or stress. Sadness or depression. Exhaustion. Fear about sexual performance. Guilt. What are the signs or symptoms? Symptoms of this condition include: Inability to get an erection. Lack of enough hardness of the erection to allow penetration. Loss of the erection before sex is finished. Sometimes having normal erections, but with frequent unsatisfactory episodes. Low sexual satisfaction in either partner due to erection problems. A curved penis occurring with erection. The curve may cause pain, or the penis may be too curved to allow for intercourse. Never having nighttime or morning erections. How is this diagnosed? This condition is often diagnosed by: Performing a physical exam to find other diseases or specific problems with the penis. Asking you detailed questions about the problem. Doing tests,  such as: Blood tests to check for diabetes mellitus or high cholesterol, or to measure hormone levels. Other tests to check for underlying health conditions. An ultrasound exam to check for scarring. A test to check blood flow to the penis. Doing a sleep study at home to measure nighttime erections. How is this treated? This condition may be treated by: Medicines, such as: Medicine taken by mouth to help you achieve an erection (oral medicine). Hormone replacement therapy to replace low testosterone levels. Medicine that is injected into the penis. Your health care provider may instruct you how to give yourself these injections at home. Medicine that is delivered with a short applicator tube. The tube is inserted into the opening at the tip of the penis, which is the opening of the urethra. A tiny pellet of medicine is put in the urethra. The pellet dissolves and enhances erectile function. This is also called MUSE (medicated urethral system for erections) therapy. Vacuum pump. This is a pump with a ring on it. The pump and ring are placed on the penis and used to create pressure that helps the penis become erect. Penile implant surgery. In this procedure, you may receive: An inflatable implant. This consists of cylinders, a pump, and a reservoir. The cylinders can be inflated with a fluid that helps to create an erection, and they can be deflated after intercourse. A semi-rigid implant. This consists of two silicone rubber rods. The rods provide some rigidity. They are also flexible, so the penis can both curve downward in its normal position and become straight for sexual intercourse. Blood vessel surgery to improve blood flow to the penis. During this procedure, a blood vessel from a different part of the body is placed into the penis to allow blood to flow around (bypass) damaged or blocked blood vessels. Lifestyle changes,  such as exercising more, losing weight, and quitting smoking. Follow  these instructions at home: Medicines  Take over-the-counter and prescription medicines only as told by your health care provider. Do not increase the dosage without first discussing it with your health care provider. If you are using self-injections, do injections as directed by your health care provider. Make sure you avoid any veins that are on the surface of the penis. After giving an injection, apply pressure to the injection site for 5 minutes. Talk to your health care provider about how to prevent headaches while taking ED medicines. These medicines may cause a sudden headache due to the increase in blood flow in your body. General instructions Exercise regularly, as directed by your health care provider. Work with your health care provider to lose weight, if needed. Do not use any products that contain nicotine or tobacco. These products include cigarettes, chewing tobacco, and vaping devices, such as e-cigarettes. If you need help quitting, ask your health care provider. Before using a vacuum pump, read the instructions that come with the pump and discuss any questions with your health care provider. Keep all follow-up visits. This is important. Contact a health care provider if: You feel nauseous. You are vomiting. You get sudden headaches while taking ED medicines. You have any concerns about your sexual health. Get help right away if: You are taking oral or injectable medicines and you have an erection that lasts longer than 4 hours. If your health care provider is unavailable, go to the nearest emergency room for evaluation. An erection that lasts much longer than 4 hours can result in permanent damage to your penis. You have severe pain in your groin or abdomen. You develop redness or severe swelling of your penis. You have redness spreading at your groin or lower abdomen. You are unable to urinate. You experience chest pain or a rapid heartbeat (palpitations) after taking oral  medicines. These symptoms may represent a serious problem that is an emergency. Do not wait to see if the symptoms will go away. Get medical help right away. Call your local emergency services (911 in the [place] Do not drive yourself to the hospital. Summary Erectile dysfunction (ED) is the inability to get or keep an erection during sexual intercourse. This condition is diagnosed based on a physical exam, your symptoms, and tests to determine the cause. Treatment varies depending on the cause and may include medicines, hormone therapy, surgery, or a vacuum pump. You may need follow-up visits to make sure that you are using your medicines or devices correctly. Get help right away if you are taking or injecting medicines and you have an erection that lasts longer than 4 hours. This information is not intended to replace advice given to you by your health care provider. Make sure you discuss any questions you have with your health care provider. Document Revised: 01/28/2021 Document Reviewed: 01/28/2021 Elsevier Patient Education  2024 [employer]Benign Prostatic Hyperplasia  Benign prostatic hyperplasia (BPH) is an enlarged prostate gland that is caused by the normal aging process. The prostate may get bigger as a man gets older. The condition is not caused by cancer. The prostate is a walnut-sized gland that is involved in the production of semen. It is located in front of the rectum and below the bladder. The bladder stores urine. The urethra carries stored urine out of the body. An enlarged prostate can press on the urethra. This can make it harder to pass urine. The buildup of urine  in the bladder can cause infection. Back pressure and infection may progress to bladder damage and kidney (renal) failure. What are the causes? This condition is part of the normal aging process. However, not all men develop problems from this condition. If the prostate enlarges away from the urethra, urine flow will  not be blocked. If it enlarges toward the urethra and compresses it, there will be problems passing urine. What increases the risk? This condition is more likely to develop in men older than 50 years. What are the signs or symptoms? Symptoms of this condition include: Getting up often during the night to urinate. Needing to urinate frequently during the day. Difficulty starting urine flow. Decrease in size and strength of your urine stream. Leaking (dribbling) after urinating. Inability to pass urine. This needs immediate treatment. Inability to completely empty your bladder. Pain when you pass urine. This is more common if there is also an infection. Urinary tract infection (UTI). How is this diagnosed? This condition is diagnosed based on your medical history, a physical exam, and your symptoms. Tests will also be done, such as: A post-void bladder scan. This measures any amount of urine that may remain in your bladder after you finish urinating. A digital rectal exam. In a rectal exam, your health care provider checks your prostate by putting a lubricated, gloved finger into your rectum to feel the back of your prostate gland. This exam detects the size of your gland and any abnormal lumps or growths. An exam of your urine (urinalysis). A prostate specific antigen (PSA) screening. This is a blood test used to screen for prostate cancer. An ultrasound. This test uses sound waves to electronically produce a picture of your prostate gland. Your health care provider may refer you to a specialist in kidney and prostate diseases (urologist). How is this treated? Once symptoms begin, your health care provider will monitor your condition (active surveillance or watchful waiting). Treatment for this condition will depend on the severity of your condition. Treatment may include: Observation and yearly exams. This may be the only treatment needed if your condition and symptoms are mild. Medicines to  relieve your symptoms, including: Medicines to shrink the prostate. Medicines to relax the muscle of the prostate. Surgery in severe cases. Surgery may include: Prostatectomy. In this procedure, the prostate tissue is removed completely through an open incision or with a laparoscope or robotics. Transurethral resection of the prostate (TURP). In this procedure, a tool is inserted through the opening at the tip of the penis (urethra). It is used to cut away tissue of the inner core of the prostate. The pieces are removed through the same opening of the penis. This removes the blockage. Transurethral incision (TUIP). In this procedure, small cuts are made in the prostate. This lessens the prostate's pressure on the urethra. Transurethral microwave thermotherapy (TUMT). This procedure uses microwaves to create heat. The heat destroys and removes a small amount of prostate tissue. Transurethral needle ablation (TUNA). This procedure uses radio frequencies to destroy and remove a small amount of prostate tissue. Interstitial laser coagulation (ILC). This procedure uses a laser to destroy and remove a small amount of prostate tissue. Transurethral electrovaporization (TUVP). This procedure uses electrodes to destroy and remove a small amount of prostate tissue. Prostatic urethral lift. This procedure inserts an implant to push the lobes of the prostate away from the urethra. Follow these instructions at home: Take over-the-counter and prescription medicines only as told by your health care provider. Monitor your  symptoms for any changes. Contact your health care provider with any changes. Avoid drinking large amounts of liquid before going to bed or out in public. Avoid or reduce how much caffeine or alcohol you drink. Give yourself time when you urinate. Keep all follow-up visits. This is important. Contact a health care provider if: You have unexplained back pain. Your symptoms do not get better  with treatment. You develop side effects from the medicine you are taking. Your urine becomes very dark or has a bad smell. Your lower abdomen becomes distended and you have trouble passing urine. Get help right away if: You have a fever or chills. You suddenly cannot urinate. You feel light-headed or very dizzy, or you faint. There are large amounts of blood or clots in your urine. Your urinary problems become hard to manage. You develop moderate to severe low back or flank pain. The flank is the side of your body between the ribs and the hip. These symptoms may be an emergency. Get help right away. Call 911. Do not wait to see if the symptoms will go away. Do not drive yourself to the hospital. Summary Benign prostatic hyperplasia (BPH) is an enlarged prostate that is caused by the normal aging process. It is not caused by cancer. An enlarged prostate can press on the urethra. This can make it hard to pass urine. This condition is more likely to develop in men older than 50 years. Get help right away if you suddenly cannot urinate. This information is not intended to replace advice given to you by your health care provider. Make sure you discuss any questions you have with your health care provider. Document Revised: 05/20/2021 Document Reviewed: 05/20/2021 Elsevier Patient Education  2024 [employer].

## 2024-01-18 NOTE — Progress Notes (Signed)
 Bladder Scan completed today.  Patient can void prior to the bladder scan. Bladder scan result: 201  Performed By: [facility] LPN

## 2024-01-18 NOTE — Progress Notes (Signed)
 01/18/2024 8:46 AM   Ryan Fuller 10-27-1966 [id number]  Referring provider: Elfredia Nevins, MD [address] [place],  [place] [address]  Penile pain   HPI: Ryan Fuller is a 57yo here for evaluation for penile pain and erectile dysfunction. For the past several months he has noted intermittent penile pain and burning with urination. He has issues getting a firm erection and maintaining an erection. His issues with erections started after beginning mirapex. He has good exercise tolerance. Testosterone 426.  IPSS 3 QOL 2 on no BPH therapy. PVR 201cc.    PMH: Past Medical History:  Diagnosis Date   Dysphagia 08/10/2017   Gout    Gout 08/10/2017   Iritis    dx by Dr. Charise Killian   Kidney stones     Surgical History: Past Surgical History:  Procedure Laterality Date   BACK SURGERY     BIOPSY  11/13/2018   Procedure: BIOPSY;  Surgeon: Malissa Hippo, MD;  [facility];  Service: Endoscopy;;  terminal ileum colon   BIOPSY  05/20/2023   Procedure: BIOPSY;  Surgeon: Dolores Frame, MD;  [facility];  Service: Gastroenterology;;   BIOPSY  10/19/2023   Procedure: BIOPSY;  Surgeon: Dolores Frame, MD;  [facility];  Service: Gastroenterology;;   COLONOSCOPY N/A 03/01/2013   Procedure: COLONOSCOPY;  Surgeon: Malissa Hippo, MD;  [facility];  Service: Endoscopy;  Laterality: N/A;  100   COLONOSCOPY N/A 11/13/2018   Procedure: COLONOSCOPY;  Surgeon: Malissa Hippo, MD;  [facility];  Service: Endoscopy;  Laterality: N/A;  1015am   COLONOSCOPY WITH PROPOFOL N/A 10/19/2023   Procedure: COLONOSCOPY WITH PROPOFOL;  Surgeon: Dolores Frame, MD;  [facility];  Service: Gastroenterology;  Laterality: N/A;  8:15am;asa 2   EGD/ED     years ago   ESOPHAGEAL DILATION N/A 08/31/2017   Procedure: ESOPHAGEAL DILATION;  Surgeon: Malissa Hippo, MD;  [facility];  Service:  Endoscopy;  Laterality: N/A;   ESOPHAGEAL DILATION N/A 05/20/2023   Procedure: ESOPHAGEAL DILATION;  Surgeon: Dolores Frame, MD;  [facility];  Service: Gastroenterology;  Laterality: N/A;  10:15am;asa 2   ESOPHAGOGASTRODUODENOSCOPY N/A 08/31/2017   Procedure: ESOPHAGOGASTRODUODENOSCOPY (EGD);  Surgeon: Malissa Hippo, MD;  [facility];  Service: Endoscopy;  Laterality: N/A;  3:10   ESOPHAGOGASTRODUODENOSCOPY (EGD) WITH PROPOFOL N/A 05/20/2023   Procedure: ESOPHAGOGASTRODUODENOSCOPY (EGD) WITH PROPOFOL;  Surgeon: Dolores Frame, MD;  [facility];  Service: Gastroenterology;  Laterality: N/A;  10:15am;asa 2   NECK SURGERY     x2   POLYPECTOMY  11/13/2018   Procedure: POLYPECTOMY;  Surgeon: Malissa Hippo, MD;  [facility];  Service: Endoscopy;;  colon   POLYPECTOMY  10/19/2023   Procedure: POLYPECTOMY;  Surgeon: Dolores Frame, MD;  [facility];  Service: Gastroenterology;;   ROTATOR CUFF REPAIR Left    Rotator cuff surgery Right     Home Medications:  Allergies as of 01/18/2024       Reactions   Phenergan [promethazine Hcl] Anaphylaxis, Other (See Comments)   severe hypotension   Colchicine Other (See Comments)   Depression   Penicillins Other (See Comments)   From childhood Has patient had a PCN reaction causing immediate rash, facial/tongue/throat swelling, SOB or lightheadedness with hypotension: Unknown Has patient had a PCN reaction causing severe rash involving mucus membranes or skin necrosis: Unknown Has  patient had a PCN reaction that required hospitalization Unknown Has patient had a PCN reaction occurring within the last 10 years: Unknown If all of the above answers are "NO", then may proceed with Cephalosporin [place]   Vancomycin Swelling, Other (See Comments)   Lips swelling        Medication List        Accurate as of January 18, 2024  8:46 AM. If you have any questions, ask  your nurse or doctor.          allopurinol 100 MG tablet Commonly known as: ZYLOPRIM Take 100 mg by mouth 2 (two) times daily.   carbidopa-levodopa 25-100 MG tablet Commonly known as: SINEMET IR Take 1 tablet as needed for severe restless leg syndrome   famotidine 20 MG tablet Commonly known as: PEPCID TAKE ONE TABLET BY MOUTH ONCE DAILY.   ibuprofen 200 MG tablet Commonly known as: ADVIL Take 400-600 mg by mouth every 8 (eight) hours as needed (pain.).   METAMUCIL FREE & NATURAL PO Take 2 scoop by mouth at bedtime.   omeprazole 40 MG capsule Commonly known as: PRILOSEC TAKE ONE CAPSULE BY MOUTH ONCE DAILY.   polyethylene glycol 17 g packet Commonly known as: MIRALAX / GLYCOLAX Take 34 g by mouth daily. w/Coffee   pramipexole 1 MG tablet Commonly known as: MIRAPEX Take 1 mg by mouth at bedtime.   pramipexole 0.5 MG tablet Commonly known as: Mirapex Take half tablet at 4pm and 1 tablet at bedtime.        Allergies:  Allergies  Allergen Reactions   Phenergan [Promethazine Hcl] Anaphylaxis and Other (See Comments)    severe hypotension   Colchicine Other (See Comments)    Depression     Penicillins Other (See Comments)    From childhood Has patient had a PCN reaction causing immediate rash, facial/tongue/throat swelling, SOB or lightheadedness with hypotension: Unknown Has patient had a PCN reaction causing severe rash involving mucus membranes or skin necrosis: Unknown Has patient had a PCN reaction that required hospitalization Unknown Has patient had a PCN reaction occurring within the last 10 years: Unknown If all of the above answers are "NO", then may proceed with Cephalosporin [place]   Vancomycin Swelling and Other (See Comments)    Lips swelling    Family History: Family History  Problem Relation Age of Onset   Dementia Mother    Kidney cancer Father    Kidney failure Brother    Congestive Heart Failure Brother    Healthy Brother    Heart  Problems Son    Healthy Son    Healthy Daughter     Social History:  reports that he has never smoked. He has been exposed to tobacco smoke. He has never used smokeless tobacco. He reports that he does not drink alcohol and does not use drugs.  ROS: All other review of systems were reviewed and are negative except what is noted above in HPI  Physical Exam: BP 135/77   Pulse 84   Constitutional:  Alert and oriented, No acute distress. HEENT:  AT, moist mucus membranes.  Trachea midline, no masses. Cardiovascular: No clubbing, cyanosis, or edema. Respiratory: Normal respiratory effort, no increased work of breathing. GI: Abdomen is soft, nontender, nondistended, no abdominal masses GU: No CVA tenderness. Circumcised phallus. No masses/lesions on penis, testis, scrotum.  Lymph: No cervical or inguinal lymphadenopathy. Skin: No rashes, bruises or suspicious lesions. Neurologic: Grossly intact, no focal deficits, moving all 4 extremities. Psychiatric: Normal mood  and affect.  Laboratory Data: Lab Results  Component Value Date   WBC 4.9 01/14/2022   HGB 15.0 01/14/2022   HCT 44.6 01/14/2022   MCV 87.1 01/14/2022   PLT 251 01/14/2022    Lab Results  Component Value Date   CREATININE 1.07 01/14/2022    No results found for: "PSA"  No results found for: "TESTOSTERONE"  No results found for: "HGBA1C"  Urinalysis    Component Value Date/Time   COLORURINE YELLOW 12/17/2015 0902   APPEARANCEUR CLEAR 12/17/2015 0902   LABSPEC 1.025 12/17/2015 0902   PHURINE 5.5 12/17/2015 0902   GLUCOSEU NEGATIVE 12/17/2015 0902   HGBUR NEGATIVE 12/17/2015 0902   BILIRUBINUR NEGATIVE 12/17/2015 0902   KETONESUR NEGATIVE 12/17/2015 0902   PROTEINUR 30 (A) 12/17/2015 0902   UROBILINOGEN 0.2 02/23/2010 0311   NITRITE NEGATIVE 12/17/2015 0902   LEUKOCYTESUR NEGATIVE 12/17/2015 0902    Lab Results  Component Value Date   BACTERIA RARE (A) 12/17/2015    Pertinent Imaging: *** No  results found for this or any previous visit.  No results found for this or any previous visit.  No results found for this or any previous visit.  No results found for this or any previous visit.  No results found for this or any previous visit.  No results found for this or any previous visit.  No results found for this or any previous visit.  No results found for this or any previous visit.   Assessment & Plan:    1. Penile pain (Primary) Related to incomplete emptying.   2. Incomplete emptying -we will trial tadalafil 5mg  daily  3. Erectile dysfunction -tadalafil 5mg  daily  No follow-ups on file.  Wilkie Aye, MD  [facility] [place]

## 2024-01-23 ENCOUNTER — Telehealth: Payer: Self-pay | Admitting: Orthopedic Surgery

## 2024-01-23 ENCOUNTER — Other Ambulatory Visit: Payer: Self-pay | Admitting: Orthopedic Surgery

## 2024-01-23 MED ORDER — ACETAMINOPHEN-CODEINE 300-30 MG PO TABS: 1.0000 | ORAL_TABLET | Freq: Four times a day (QID) | ORAL | 0 refills | Status: DC | PRN

## 2024-01-23 NOTE — Telephone Encounter (Signed)
 Pt's wife Jasmine December called requesting pain medication to get pt thru until seeing Dr Christell Constant on 3/20. She states pt is in severe pains and he is unable to sleep. Please send meds to [facility] in [place]. Pt phone number is [phone]. Please call pt when sent

## 2024-01-23 NOTE — Telephone Encounter (Signed)
T 3 sent

## 2024-01-31 ENCOUNTER — Telehealth: Payer: Self-pay

## 2024-01-31 NOTE — Telephone Encounter (Signed)
 Patient states that while taking the Cialis he started to "feel funny." He states that his heart began to flutter and he felt like he was going to pass out.  Patient advised to stop medication and that he would be notified to MD recommendations.

## 2024-02-02 ENCOUNTER — Ambulatory Visit: Payer: Medicare HMO | Admitting: Orthopedic Surgery

## 2024-02-06 NOTE — Telephone Encounter (Signed)
 Return call to patient whom state's he spoke to nurse over a week ago on alternative medication because the Cialis was making him "feel funny". Patient is aware a message will be sent to provider on alternative. Patient voiced understanding.

## 2024-02-08 DIAGNOSIS — M5416 Radiculopathy, lumbar region: Secondary | ICD-10-CM | POA: Diagnosis not present

## 2024-02-08 DIAGNOSIS — Z6833 Body mass index (BMI) 33.0-33.9, adult: Secondary | ICD-10-CM | POA: Diagnosis not present

## 2024-02-11 ENCOUNTER — Ambulatory Visit
Admission: EM | Admit: 2024-02-11 | Discharge: 2024-02-11 | Disposition: A | Discharge status: Home / Self Care | Attending: Nurse Practitioner | Admitting: Nurse Practitioner

## 2024-02-11 ENCOUNTER — Encounter: Payer: Self-pay | Admitting: Emergency Medicine

## 2024-02-11 DIAGNOSIS — J069 Acute upper respiratory infection, unspecified: Secondary | ICD-10-CM | POA: Diagnosis not present

## 2024-02-11 LAB — POC COVID19/FLU A&B COMBO
Covid Antigen, POC: NEGATIVE
Influenza A Antigen, POC: NEGATIVE
Influenza B Antigen, POC: NEGATIVE

## 2024-02-11 MED ORDER — GUAIFENESIN 100 MG/5ML PO LIQD: 15.0000 mL | Freq: Three times a day (TID) | ORAL | 0 refills | Status: AC | PRN

## 2024-02-11 NOTE — ED Provider Notes (Signed)
 [facility]    CSN: [id number] Arrival date & time: 02/11/24  0801      History   Chief Complaint No chief complaint on file.   HPI Ryan Fuller is a 58 y.o. male.   The history is provided by the patient.   Patient presents for complaints of bodyaches and cough that started over the past 24 hours.  Patient denies fever, chills, headache, ear pain, nasal congestion, runny nose, sore throat, wheezing, difficulty breathing, chest pain, abdominal pain, nausea, vomiting, diarrhea, or rash.  Patient reports recent exposure to influenza from his wife.  States he has been taking Alka-Seltzer for symptoms.  Past Medical History:  Diagnosis Date   Dysphagia 08/10/2017   Gout    Gout 08/10/2017   Iritis    dx by Dr. Charise Killian   Kidney stones     Patient Active Problem List   Diagnosis Date Noted   History of colonic polyps 10/19/2023   Chest pain 05/20/2023   DDD (degenerative disc disease), cervical 02/04/2022   DDD (degenerative disc disease), lumbar 02/04/2022   Primary osteoarthritis of both hands 02/04/2022   Arthritis of both acromioclavicular joints 02/04/2022   GERD (gastroesophageal reflux disease) 09/02/2021   Sigmoid diverticulitis 10/01/2019   Family hx of colon cancer 10/10/2018   Rectal bleeding 10/10/2018   History of colon polyps 10/10/2018   Dysphagia 08/10/2017   Gout 08/10/2017   Constipation 02/21/2013    Past Surgical History:  Procedure Laterality Date   BACK SURGERY     BIOPSY  11/13/2018   Procedure: BIOPSY;  Surgeon: Malissa Hippo, MD;  [facility];  Service: Endoscopy;;  terminal ileum colon   BIOPSY  05/20/2023   Procedure: BIOPSY;  Surgeon: Dolores Frame, MD;  [facility];  Service: Gastroenterology;;   BIOPSY  10/19/2023   Procedure: BIOPSY;  Surgeon: Dolores Frame, MD;  [facility];  Service: Gastroenterology;;   COLONOSCOPY N/A 03/01/2013   Procedure: COLONOSCOPY;   Surgeon: Malissa Hippo, MD;  [facility];  Service: Endoscopy;  Laterality: N/A;  100   COLONOSCOPY N/A 11/13/2018   Procedure: COLONOSCOPY;  Surgeon: Malissa Hippo, MD;  [facility];  Service: Endoscopy;  Laterality: N/A;  1015am   COLONOSCOPY WITH PROPOFOL N/A 10/19/2023   Procedure: COLONOSCOPY WITH PROPOFOL;  Surgeon: Dolores Frame, MD;  [facility];  Service: Gastroenterology;  Laterality: N/A;  8:15am;asa 2   EGD/ED     years ago   ESOPHAGEAL DILATION N/A 08/31/2017   Procedure: ESOPHAGEAL DILATION;  Surgeon: Malissa Hippo, MD;  [facility];  Service: Endoscopy;  Laterality: N/A;   ESOPHAGEAL DILATION N/A 05/20/2023   Procedure: ESOPHAGEAL DILATION;  Surgeon: Dolores Frame, MD;  [facility];  Service: Gastroenterology;  Laterality: N/A;  10:15am;asa 2   ESOPHAGOGASTRODUODENOSCOPY N/A 08/31/2017   Procedure: ESOPHAGOGASTRODUODENOSCOPY (EGD);  Surgeon: Malissa Hippo, MD;  [facility];  Service: Endoscopy;  Laterality: N/A;  3:10   ESOPHAGOGASTRODUODENOSCOPY (EGD) WITH PROPOFOL N/A 05/20/2023   Procedure: ESOPHAGOGASTRODUODENOSCOPY (EGD) WITH PROPOFOL;  Surgeon: Dolores Frame, MD;  [facility];  Service: Gastroenterology;  Laterality: N/A;  10:15am;asa 2   NECK SURGERY     x2   POLYPECTOMY  11/13/2018   Procedure: POLYPECTOMY;  Surgeon: Malissa Hippo, MD;  [facility];  Service: Endoscopy;;  colon   POLYPECTOMY  10/19/2023   Procedure: POLYPECTOMY;  Surgeon: Marguerita Merles, Reuel Boom, MD;  [facility];  Service: Gastroenterology;;   ROTATOR CUFF REPAIR Left    Rotator cuff surgery Right        Home Medications    Prior to Admission medications   Medication Sig Start Date End Date Taking? Authorizing Provider  acetaminophen-codeine (TYLENOL #3) 300-30 MG tablet Take 1 tablet by mouth every 6 (six) hours as needed for moderate  pain (pain score 4-6). 01/23/24   Cammy Copa, MD  allopurinol (ZYLOPRIM) 100 MG tablet Take 100 mg by mouth 2 (two) times daily.     [provider]  carbidopa-levodopa (SINEMET IR) 25-100 MG tablet Take 1 tablet as needed for severe restless leg syndrome 12/19/23   Nita Sickle K, DO  famotidine (PEPCID) 20 MG tablet TAKE ONE TABLET BY MOUTH ONCE DAILY. 11/03/23   Dolores Frame, MD  ibuprofen (ADVIL) 200 MG tablet Take 400-600 mg by mouth every 8 (eight) hours as needed (pain.).    [provider]  omeprazole (PRILOSEC) 40 MG capsule TAKE ONE CAPSULE BY MOUTH ONCE DAILY. 10/24/23   Carlan, Chelsea L, NP  polyethylene glycol (MIRALAX / GLYCOLAX) packet Take 34 g by mouth daily. w/Coffee    [provider]  pramipexole (MIRAPEX) 0.5 MG tablet Take half tablet at 4pm and 1 tablet at bedtime. 12/19/23   Nita Sickle K, DO  pramipexole (MIRAPEX) 1 MG tablet Take 1 mg by mouth at bedtime. 04/25/23   [provider]  Psyllium (METAMUCIL FREE & NATURAL PO) Take 2 scoop by mouth at bedtime.    [provider]  tadalafil (CIALIS) 5 MG tablet Take 1 tablet (5 mg total) by mouth daily. 01/18/24   McKenzie, Mardene Celeste, MD    Family History Family History  Problem Relation Age of Onset   Dementia Mother    Kidney cancer Father    Kidney failure Brother    Congestive Heart Failure Brother    Healthy Brother    Heart Problems Son    Healthy Son    Healthy Daughter     Social History Social History   Tobacco Use   Smoking status: Never    Passive exposure: Past   Smokeless tobacco: Never  Vaping Use   Vaping status: Never Used  Substance Use Topics   Alcohol use: No   Drug use: No     Allergies   Phenergan [promethazine hcl], Colchicine, Penicillins, and Vancomycin   Review of Systems Review of Systems Per HPI  Physical Exam Triage Vital Signs ED Triage Vitals  Encounter Vitals Group     BP 02/11/24 0816 (!) 155/78      Systolic BP Percentile --      Diastolic BP Percentile --      Pulse Rate 02/11/24 0816 88     Resp 02/11/24 0816 18     Temp 02/11/24 0816 98.9 F (37.2 C)     Temp Source 02/11/24 0816 Oral     SpO2 02/11/24 0816 93 %     Weight --      Height --      Head Circumference --      Peak Flow --      Pain Score 02/11/24 0817 5     Pain Loc --      Pain Education --      Exclude from Growth Chart --    No data found.  Updated Vital Signs BP (!) 155/78 (BP Location: Right Arm)  Pulse 88   Temp 98.9 F (37.2 C) (Oral)   Resp 18   SpO2 93%   Visual Acuity Right Eye Distance:   Left Eye Distance:   Bilateral Distance:    Right Eye Near:   Left Eye Near:    Bilateral Near:     Physical Exam Vitals and nursing note reviewed.  Constitutional:      General: He is not in acute distress.    Appearance: Normal appearance.  HENT:     Head: Normocephalic.     Right Ear: Tympanic membrane, ear canal and external ear normal.     Left Ear: Tympanic membrane, ear canal and external ear normal.     Nose: Congestion present.     Right Turbinates: Enlarged and swollen.     Left Turbinates: Enlarged and swollen.     Right Sinus: No maxillary sinus tenderness or frontal sinus tenderness.     Left Sinus: No maxillary sinus tenderness or frontal sinus tenderness.     Mouth/Throat:     Lips: Pink.     Mouth: Mucous membranes are moist.     Pharynx: Uvula midline. Postnasal drip present. No pharyngeal swelling, oropharyngeal exudate, posterior oropharyngeal erythema or uvula swelling.  Eyes:     Extraocular Movements: Extraocular movements intact.     Pupils: Pupils are equal, round, and reactive to light.  Cardiovascular:     Rate and Rhythm: Normal rate and regular rhythm.     Pulses: Normal pulses.     Heart sounds: Normal heart sounds.  Pulmonary:     Effort: Pulmonary effort is normal.     Breath sounds: Normal breath sounds.  Abdominal:     General: Bowel sounds are normal.      Palpations: Abdomen is soft.  Musculoskeletal:     Cervical back: Normal range of motion.  Skin:    General: Skin is warm and dry.  Neurological:     General: No focal deficit present.     Mental Status: He is alert and oriented to person, place, and time.  Psychiatric:        Mood and Affect: Mood normal.        Behavior: Behavior normal.      UC Treatments / Results  Labs (all labs ordered are listed, but only abnormal results are displayed) Labs Reviewed  POC COVID19/FLU A&B COMBO    EKG   Radiology No results found.  Procedures Procedures (including critical care time)  Medications Ordered in UC Medications - No data to display  Initial Impression / Assessment and Plan / UC Course  I have reviewed the triage vital signs and the nursing notes.  Pertinent labs & imaging results that were available during my care of the patient were reviewed by me and considered in my medical decision making (see chart for details).  COVID/flu test was negative.  Symptoms consistent with a viral URI with cough.  Guaifenesin 100 mg prescribed for cough.  Supportive care recommendations were provided and discussed with the patient to include fluids, rest, over-the-counter analgesics, and use of a humidifier during sleep.  Discussed indications regarding follow-up.  Patient was in agreement with this plan of care and verbalizes understanding.  All questions were answered.  Patient stable for discharge.  Final Clinical Impressions(s) / UC Diagnoses   Final diagnoses:  None   Discharge Instructions   None    ED Prescriptions   None    PDMP not reviewed this encounter.   Leath-Warren,  Sadie Haber, NP 02/11/24 [phone]

## 2024-02-11 NOTE — Discharge Instructions (Signed)
 The COVID/flu test was negative. Take medication as prescribed.  As discussed, you may also take Delsym or Coricidin HBP for your cough. May take over-the-counter Tylenol as needed for pain, fever, or general discomfort. Increase fluids and allow for plenty of rest. Recommend use of a humidifier in your bedroom at nighttime during sleep and sleeping elevated on pillows while cough symptoms persist. If symptoms do not improve over the next 5 to 7 days, or appear to be worsening, you may follow-up in this clinic or with your primary care physician for further evaluation. Follow-up as needed.

## 2024-02-11 NOTE — ED Triage Notes (Signed)
 Body aches, cough since yesterday.  States exposed to flu from wife.

## 2024-03-01 DIAGNOSIS — M5116 Intervertebral disc disorders with radiculopathy, lumbar region: Secondary | ICD-10-CM | POA: Diagnosis not present

## 2024-03-01 DIAGNOSIS — M48061 Spinal stenosis, lumbar region without neurogenic claudication: Secondary | ICD-10-CM | POA: Diagnosis not present

## 2024-03-01 DIAGNOSIS — M5416 Radiculopathy, lumbar region: Secondary | ICD-10-CM | POA: Diagnosis not present

## 2024-03-14 ENCOUNTER — Ambulatory Visit: Admitting: Urology

## 2024-03-14 VITALS — BP 134/86 | HR 78

## 2024-03-14 DIAGNOSIS — N138 Other obstructive and reflux uropathy: Secondary | ICD-10-CM | POA: Diagnosis not present

## 2024-03-14 DIAGNOSIS — N4889 Other specified disorders of penis: Secondary | ICD-10-CM | POA: Diagnosis not present

## 2024-03-14 DIAGNOSIS — N401 Enlarged prostate with lower urinary tract symptoms: Secondary | ICD-10-CM | POA: Diagnosis not present

## 2024-03-14 DIAGNOSIS — R339 Retention of urine, unspecified: Secondary | ICD-10-CM

## 2024-03-14 DIAGNOSIS — Z87438 Personal history of other diseases of male genital organs: Secondary | ICD-10-CM | POA: Diagnosis not present

## 2024-03-14 LAB — URINALYSIS, ROUTINE W REFLEX MICROSCOPIC
Bilirubin, UA: NEGATIVE
Glucose, UA: NEGATIVE
Ketones, UA: NEGATIVE
Leukocytes,UA: NEGATIVE
Nitrite, UA: NEGATIVE
Protein,UA: NEGATIVE
RBC, UA: NEGATIVE
Specific Gravity, UA: 1.025 (ref 1.005–1.030)
Urobilinogen, Ur: 1 mg/dL (ref 0.2–1.0)
pH, UA: 6 (ref 5.0–7.5)

## 2024-03-14 LAB — BLADDER SCAN AMB NON-IMAGING: Scan Result: 19

## 2024-03-14 NOTE — Progress Notes (Signed)
post void residual=19

## 2024-03-14 NOTE — Progress Notes (Signed)
 03/14/2024 11:02 AM   Ryan Fuller 1966/01/09 [id number]  Referring provider: Minus Amel, MD [address] [place],  [place] [address]  Followup BPh and erectile dysfunction   HPI: Mr Ryan Fuller is a 57yo here for followup for erectile dysfunction and BPH with incomplete emptying. IPSS 5 QOL 2 on no therapy. Nocturia 1x. Tadalafil  caused him to have fluttering in his chest. PVR 19cc.    PMH: Past Medical History:  Diagnosis Date   Dysphagia 08/10/2017   Gout    Gout 08/10/2017   Iritis    dx by Dr. Barbra Fuller   Kidney stones     Surgical History: Past Surgical History:  Procedure Laterality Date   BACK SURGERY     BIOPSY  11/13/2018   Procedure: BIOPSY;  Surgeon: Ryan Corporal, MD;  [facility];  Service: Endoscopy;;  terminal ileum colon   BIOPSY  05/20/2023   Procedure: BIOPSY;  Surgeon: Ryan Garden, MD;  [facility];  Service: Gastroenterology;;   BIOPSY  10/19/2023   Procedure: BIOPSY;  Surgeon: Ryan Garden, MD;  [facility];  Service: Gastroenterology;;   COLONOSCOPY N/A 03/01/2013   Procedure: COLONOSCOPY;  Surgeon: Ryan Corporal, MD;  [facility];  Service: Endoscopy;  Laterality: N/A;  100   COLONOSCOPY N/A 11/13/2018   Procedure: COLONOSCOPY;  Surgeon: Ryan Corporal, MD;  [facility];  Service: Endoscopy;  Laterality: N/A;  1015am   COLONOSCOPY WITH PROPOFOL  N/A 10/19/2023   Procedure: COLONOSCOPY WITH PROPOFOL ;  Surgeon: Ryan Garden, MD;  [facility];  Service: Gastroenterology;  Laterality: N/A;  8:15am;asa 2   EGD/ED     years ago   ESOPHAGEAL DILATION N/A 08/31/2017   Procedure: ESOPHAGEAL DILATION;  Surgeon: Ryan Corporal, MD;  [facility];  Service: Endoscopy;  Laterality: N/A;   ESOPHAGEAL DILATION N/A 05/20/2023   Procedure: ESOPHAGEAL DILATION;  Surgeon: Ryan Garden, MD;  [facility];   Service: Gastroenterology;  Laterality: N/A;  10:15am;asa 2   ESOPHAGOGASTRODUODENOSCOPY N/A 08/31/2017   Procedure: ESOPHAGOGASTRODUODENOSCOPY (EGD);  Surgeon: Ryan Corporal, MD;  [facility];  Service: Endoscopy;  Laterality: N/A;  3:10   ESOPHAGOGASTRODUODENOSCOPY (EGD) WITH PROPOFOL  N/A 05/20/2023   Procedure: ESOPHAGOGASTRODUODENOSCOPY (EGD) WITH PROPOFOL ;  Surgeon: Ryan Garden, MD;  [facility];  Service: Gastroenterology;  Laterality: N/A;  10:15am;asa 2   NECK SURGERY     x2   POLYPECTOMY  11/13/2018   Procedure: POLYPECTOMY;  Surgeon: Ryan Corporal, MD;  [facility];  Service: Endoscopy;;  colon   POLYPECTOMY  10/19/2023   Procedure: POLYPECTOMY;  Surgeon: Ryan Garden, MD;  [facility];  Service: Gastroenterology;;   ROTATOR CUFF REPAIR Left    Rotator cuff surgery Right     Home Medications:  Allergies as of 03/14/2024       Reactions   Phenergan [promethazine Hcl] Anaphylaxis, Other (See Comments)   severe hypotension   Colchicine Other (See Comments)   Depression   Penicillins Other (See Comments)   From childhood Has patient had a PCN reaction causing immediate rash, facial/tongue/throat swelling, SOB or lightheadedness with hypotension: Unknown Has patient had a PCN reaction causing severe rash involving mucus membranes or skin necrosis: Unknown Has patient had a PCN reaction that required hospitalization Unknown Has patient had a PCN reaction occurring within the last 10 years: Unknown If all of the above  answers are "NO", then may proceed with Cephalosporin us    Vancomycin Swelling, Other (See Comments)   Lips swelling        Medication List        Accurate as of March 14, 2024 11:02 AM. If you have any questions, ask your nurse or doctor.          acetaminophen -codeine  300-30 MG tablet Commonly known as: TYLENOL  #3 Take 1 tablet by mouth every 6 (six) hours as needed for  moderate pain (pain score 4-6).   allopurinol 100 MG tablet Commonly known as: ZYLOPRIM Take 100 mg by mouth 2 (two) times daily.   carbidopa -levodopa  25-100 MG tablet Commonly known as: SINEMET  IR Take 1 tablet as needed for severe restless leg syndrome   famotidine  20 MG tablet Commonly known as: PEPCID  TAKE ONE TABLET BY MOUTH ONCE DAILY.   ibuprofen 200 MG tablet Commonly known as: ADVIL Take 400-600 mg by mouth every 8 (eight) hours as needed (pain.).   METAMUCIL FREE & NATURAL PO Take 2 scoop by mouth at bedtime.   omeprazole  40 MG capsule Commonly known as: PRILOSEC TAKE ONE CAPSULE BY MOUTH ONCE DAILY.   polyethylene glycol 17 g packet Commonly known as: MIRALAX / GLYCOLAX Take 34 g by mouth daily. w/Coffee   pramipexole  1 MG tablet Commonly known as: MIRAPEX  Take 1 mg by mouth at bedtime.   pramipexole  0.5 MG tablet Commonly known as: Mirapex  Take half tablet at 4pm and 1 tablet at bedtime.   tadalafil  5 MG tablet Commonly known as: CIALIS  Take 1 tablet (5 mg total) by mouth daily.        Allergies:  Allergies  Allergen Reactions   Phenergan [Promethazine Hcl] Anaphylaxis and Other (See Comments)    severe hypotension   Colchicine Other (See Comments)    Depression     Penicillins Other (See Comments)    From childhood Has patient had a PCN reaction causing immediate rash, facial/tongue/throat swelling, SOB or lightheadedness with hypotension: Unknown Has patient had a PCN reaction causing severe rash involving mucus membranes or skin necrosis: Unknown Has patient had a PCN reaction that required hospitalization Unknown Has patient had a PCN reaction occurring within the last 10 years: Unknown If all of the above answers are "NO", then may proceed with Cephalosporin us    Vancomycin Swelling and Other (See Comments)    Lips swelling    Family History: Family History  Problem Relation Age of Onset   Dementia Mother    Kidney cancer Father     Kidney failure Brother    Congestive Heart Failure Brother    Healthy Brother    Heart Problems Son    Healthy Son    Healthy Daughter     Social History:  reports that he has never smoked. He has been exposed to tobacco smoke. He has never used smokeless tobacco. He reports that he does not drink alcohol and does not use drugs.  ROS: All other review of systems were reviewed and are negative except what is noted above in HPI  Physical Exam: BP 134/86   Pulse 78   Constitutional:  Alert and oriented, No acute distress. HEENT: [place] AT, moist mucus membranes.  Trachea midline, no masses. Cardiovascular: No clubbing, cyanosis, or edema. Respiratory: Normal respiratory effort, no increased work of breathing. GI: Abdomen is soft, nontender, nondistended, no abdominal masses GU: No CVA tenderness. Prostate 20g smooth Lymph: No cervical or inguinal lymphadenopathy. Skin: No rashes, bruises or suspicious lesions.  Neurologic: Grossly intact, no focal deficits, moving all 4 extremities. Psychiatric: Normal mood and affect.  Laboratory Data: Lab Results  Component Value Date   WBC 4.9 01/14/2022   HGB 15.0 01/14/2022   HCT 44.6 01/14/2022   MCV 87.1 01/14/2022   PLT 251 01/14/2022    Lab Results  Component Value Date   CREATININE 1.07 01/14/2022    No results found for: "PSA"  No results found for: "TESTOSTERONE "  No results found for: "HGBA1C"  Urinalysis    Component Value Date/Time   COLORURINE YELLOW 12/17/2015 0902   APPEARANCEUR Clear 01/18/2024 0854   LABSPEC 1.025 12/17/2015 0902   PHURINE 5.5 12/17/2015 0902   GLUCOSEU Negative 01/18/2024 0854   HGBUR NEGATIVE 12/17/2015 0902   BILIRUBINUR Negative 01/18/2024 0854   KETONESUR NEGATIVE 12/17/2015 0902   PROTEINUR Negative 01/18/2024 0854   PROTEINUR 30 (A) 12/17/2015 0902   UROBILINOGEN 0.2 02/23/2010 0311   NITRITE Negative 01/18/2024 0854   NITRITE NEGATIVE 12/17/2015 0902   LEUKOCYTESUR Negative  01/18/2024 0854    Lab Results  Component Value Date   LABMICR Comment 01/18/2024   BACTERIA RARE (A) 12/17/2015    Pertinent Imaging: *** No results found for this or any previous visit.  No results found for this or any previous visit.  No results found for this or any previous visit.  No results found for this or any previous visit.  No results found for this or any previous visit.  No results found for this or any previous visit.  No results found for this or any previous visit.  No results found for this or any previous visit.   Assessment & Plan:    1. Penile pain resolved - Urinalysis, Routine w reflex microscopic - BLADDER SCAN AMB NON-IMAGING  2. Incomplete bladder emptying (Primary) resolved  3. Benign prostatic hyperplasia with urinary obstruction Patient defers therapy at this time. PSA today  4. Erectile dysfunction -patient defers therapy at this time.    No follow-ups on file.  Johnie Nailer, MD  [facility] [place]

## 2024-03-15 LAB — PSA: Prostate Specific Ag, Serum: 1.4 ng/mL (ref 0.0–4.0)

## 2024-03-19 ENCOUNTER — Encounter (INDEPENDENT_AMBULATORY_CARE_PROVIDER_SITE_OTHER): Payer: Self-pay | Admitting: Gastroenterology

## 2024-03-19 ENCOUNTER — Ambulatory Visit (INDEPENDENT_AMBULATORY_CARE_PROVIDER_SITE_OTHER): Payer: Medicare HMO | Admitting: Gastroenterology

## 2024-03-19 VITALS — BP 142/79 | HR 91 | Temp 97.5°F | Ht 72.0 in | Wt 245.0 lb

## 2024-03-19 DIAGNOSIS — K5900 Constipation, unspecified: Secondary | ICD-10-CM | POA: Diagnosis not present

## 2024-03-19 DIAGNOSIS — K219 Gastro-esophageal reflux disease without esophagitis: Secondary | ICD-10-CM | POA: Diagnosis not present

## 2024-03-19 DIAGNOSIS — K5903 Drug induced constipation: Secondary | ICD-10-CM

## 2024-03-19 MED ORDER — OMEPRAZOLE 40 MG PO CPDR: 40.0000 mg | DELAYED_RELEASE_CAPSULE | Freq: Every day | ORAL | 3 refills | Status: AC

## 2024-03-19 NOTE — Progress Notes (Addendum)
 Referring Provider: Minus Amel, MD Primary Care Physician:  Minus Amel, MD Primary GI Physician: Dr. Sammi Crick   Chief Complaint  Patient presents with   Follow-up    Pt arrives for follow up. Pt states no issues. Pt had back surgery 3 weeks ago and has been taking pain med very little. Some constipation.    HPI:   Ryan Fuller is a 58 y.o. male with past medical history of GERD, Kidney stones, multiple cervical surgeries   Patient presenting today for:  Follow up of GERD and dysphagia New onset constipation   Last seen October 2024, at that time, GERD well controlled, having some dysphagia for the past few years since cervical neck surgery, about once per week. Due for screening colonoscopy  Recommended chewing precautions, continue omeprazole  40mg  daily, continue famotidine  20mg  nightly, schedule colonoscopy.  Present:  Doing well. GERD controlled on omeprazole  once daily an famotidine  20mg  nightly. No dysphagia.   Had recent back surgery 3 weeks ago and taking some pain medications, noting more constipation since. Taking 2 capfuls of miralax daily due to constipation and having maybe a small BM daily on this. Trying to drink a decent amount of water . Denies abdominal pain. Trying to limit pain medication intake. He is also taking metamucil 2 scoops daily, he has been on this for a while.   No red flag symptoms. Patient denies melena, hematochezia, nausea, vomiting, diarrhea, dysphagia, odyonophagia, early satiety or weight loss.    Last Colonoscopy:10/2023 - Two 3 to 8 mm polyps in the transverse colon and                            in the ascending colon, removed with a cold snare.                            Resected and retrieved.                           - One 2 mm polyp in the transverse colon, removed                            with a cold biopsy forceps. Resected and retrieved.                           - One 3 mm polyp in the sigmoid colon, removed with                             a cold snare. Resected and retrieved.                           - Diverticulosis in the sigmoid colon and in the                            descending colon.                           - Non-bleeding internal hemorrhoids.  A. COLON, ASCENDING, TRANSVERSE, POLYPECTOMY:  - Tubular adenoma(s) without high-grade dysplasia or malignancy  - Sessile serrated polyp(s) without cytologic dysplasia   B. COLON, SIGMOID, POLYPECTOMY:  -  Tubular adenoma without high-grade dysplasia or malignancy   Last EGD:  05/2023- No endoscopic esophageal abnormality to explain                            patient's dysphagia. Esophagus dilated. Dilated.                            Biopsied.                           - 1 cm hiatal hernia.                           - Normal stomach.                           - Normal examined duodenum. Barium esophagram 2020 -  prominent cricopharyngeus but barium pill but barium pill passed from oral cavity stomach without any delay   Repeat Colonoscopy due in 2029  Past Medical History:  Diagnosis Date   Dysphagia 08/10/2017   Gout    Gout 08/10/2017   Iritis    dx by Dr. Barbra Boone   Kidney stones     Past Surgical History:  Procedure Laterality Date   BACK SURGERY     BIOPSY  11/13/2018   Procedure: BIOPSY;  Surgeon: Ruby Corporal, MD;  [facility]: [facility];  Service: Endoscopy;;  terminal ileum colon   BIOPSY  05/20/2023   Procedure: BIOPSY;  Surgeon: Urban Garden, MD;  [facility]: [facility];  Service: Gastroenterology;;   BIOPSY  10/19/2023   Procedure: BIOPSY;  Surgeon: Urban Garden, MD;  [facility]: [facility];  Service: Gastroenterology;;   COLONOSCOPY N/A 03/01/2013   Procedure: COLONOSCOPY;  Surgeon: Ruby Corporal, MD;  Location: [facility];  Service: Endoscopy;  Laterality: N/A;  100   COLONOSCOPY N/A 11/13/2018   Procedure: COLONOSCOPY;  Surgeon: Ruby Corporal, MD;  [facility]: [facility];  Service:  Endoscopy;  Laterality: N/A;  1015am   COLONOSCOPY WITH PROPOFOL  N/A 10/19/2023   Procedure: COLONOSCOPY WITH PROPOFOL ;  Surgeon: Urban Garden, MD;  [facility]: [facility];  Service: Gastroenterology;  Laterality: N/A;  8:15am;asa 2   EGD/ED     years ago   ESOPHAGEAL DILATION N/A 08/31/2017   Procedure: ESOPHAGEAL DILATION;  Surgeon: Ruby Corporal, MD;  [facility]: [facility];  Service: Endoscopy;  Laterality: N/A;   ESOPHAGEAL DILATION N/A 05/20/2023   Procedure: ESOPHAGEAL DILATION;  Surgeon: Urban Garden, MD;  [facility]: [facility];  Service: Gastroenterology;  Laterality: N/A;  10:15am;asa 2   ESOPHAGOGASTRODUODENOSCOPY N/A 08/31/2017   Procedure: ESOPHAGOGASTRODUODENOSCOPY (EGD);  Surgeon: Ruby Corporal, MD;  [facility]: [facility];  Service: Endoscopy;  Laterality: N/A;  3:10   ESOPHAGOGASTRODUODENOSCOPY (EGD) WITH PROPOFOL  N/A 05/20/2023   Procedure: ESOPHAGOGASTRODUODENOSCOPY (EGD) WITH PROPOFOL ;  Surgeon: Urban Garden, MD;  [facility]: [facility];  Service: Gastroenterology;  Laterality: N/A;  10:15am;asa 2   NECK SURGERY     x2   POLYPECTOMY  11/13/2018   Procedure: POLYPECTOMY;  Surgeon: Ruby Corporal, MD;  [facility];  Service: Endoscopy;;  colon   POLYPECTOMY  10/19/2023   Procedure: POLYPECTOMY;  Surgeon: Urban Garden, MD;  [facility]: [facility];  Service: Gastroenterology;;  ROTATOR CUFF REPAIR Left    Rotator cuff surgery Right     Current Outpatient Medications  Medication Sig Dispense Refill   acetaminophen -codeine  (TYLENOL  #3) 300-30 MG tablet Take 1 tablet by mouth every 6 (six) hours as needed for moderate pain (pain score 4-6). (Patient not taking: Reported on 03/19/2024) 30 tablet 0   allopurinol (ZYLOPRIM) 100 MG tablet Take 100 mg by mouth 2 (two) times daily.      famotidine  (PEPCID ) 20 MG tablet TAKE ONE TABLET BY MOUTH ONCE DAILY. 90 tablet 1   ibuprofen (ADVIL) 200 MG tablet Take  400-600 mg by mouth every 8 (eight) hours as needed (pain.).     omeprazole  (PRILOSEC) 40 MG capsule TAKE ONE CAPSULE BY MOUTH ONCE DAILY. 60 capsule 2   polyethylene glycol (MIRALAX / GLYCOLAX) packet Take 34 g by mouth daily. w/Coffee     pramipexole  (MIRAPEX ) 0.5 MG tablet Take half tablet at 4pm and 1 tablet at bedtime. 45 tablet 5   Psyllium (METAMUCIL FREE & NATURAL PO) Take 2 scoop by mouth at bedtime.     carbidopa -levodopa  (SINEMET  IR) 25-100 MG tablet Take 1 tablet as needed for severe restless leg syndrome (Patient not taking: Reported on 03/19/2024) 30 tablet 2   pramipexole  (MIRAPEX ) 1 MG tablet Take 1 mg by mouth at bedtime. (Patient not taking: Reported on 03/19/2024)     No current facility-administered medications for this visit.    Allergies as of 03/19/2024 - Review Complete 03/19/2024  Allergen Reaction Noted   Phenergan [promethazine hcl] Anaphylaxis and Other (See Comments) 02/20/2013   Colchicine Other (See Comments) 02/20/2013   Penicillins Other (See Comments) 09/25/2014   Vancomycin Swelling and Other (See Comments) 05/27/2016    Social History   Socioeconomic History   Marital status: Married    Spouse name: Not on file   Number of children: Not on file   Years of education: Not on file   Highest education level: Not on file  Occupational History   Not on file  Tobacco Use   Smoking status: Never    Passive exposure: Past   Smokeless tobacco: Never  Vaping Use   Vaping status: Never Used  Substance and Sexual Activity   Alcohol use: No   Drug use: No   Sexual activity: Not on file  Other Topics Concern   Not on file  Social History Narrative   Are you right handed or left handed? Right handed   Are you currently employed ? No   What is your current occupation?   Do you live at home alone? No   Who lives with you? With wife    What type of home do you live in: 1 story or 2 story? Lives in a one story home       Social Drivers of Health    Financial Resource Strain: Not on file  Food Insecurity: Not on file  Transportation Needs: Not on file  Physical Activity: Not on file  Stress: Not on file  Social Connections: Not on file   Review of systems General: negative for malaise, night sweats, fever, chills, weight loss Neck: Negative for lumps, goiter, pain and significant neck swelling Resp: Negative for cough, wheezing, dyspnea at rest CV: Negative for chest pain, leg swelling, palpitations, orthopnea GI: denies melena, hematochezia, nausea, vomiting, diarrhea, dysphagia, odyonophagia, early satiety or unintentional weight loss. +constipation  The remainder of the review of systems is noncontributory.  Physical Exam: BP (!) 142/79   Pulse  91   Temp (!) 97.5 F (36.4 C)   Ht 6' (1.829 m)   Wt 245 lb (111.1 kg)   BMI 33.23 kg/m  General:   Alert and oriented. No distress noted. Pleasant and cooperative.  Head:  Normocephalic and atraumatic. Eyes:  Conjuctiva clear without scleral icterus. Mouth:  Oral mucosa pink and moist. Good dentition. No lesions. Heart: Normal rate and rhythm, s1 and s2 heart sounds present.  Lungs: Clear lung sounds in all lobes. Respirations equal and unlabored. Abdomen:  +BS, soft, non-tender and non-distended. No rebound or guarding. No HSM or masses noted. Neurologic:  Alert and  oriented x4 Psych:  Alert and cooperative. Normal mood and affect.  Invalid input(s): "6 MONTHS"   ASSESSMENT: Hendryx V Fogg is a 58 y.o. male presenting today for follow up of GERD/dysphagia and constipation   GERD/dysphagia: well controlled on omeprazole  40mg  daily, famotidine  20mg  at bedtime. No breakthrough. No issues with dysphagia currently. Will continue with current PPI/H2B regimen and good reflux precautions   Constipation: since recent back surgery 3 weeks ago and being on some pain medication which he is trying to limit, takes 2 scoops metamucil at baseline, added miralax 2 capfuls daily  recently, still some constipation on this regimen. Constipation likely secondary to recent anesthesia and pain meds, Advised to decrease metamucil to 1 scoop per day, increase miralax to TID, increase water  to atleast 64 oz per day. Patient to make me aware if constipation does not improve.    PLAN:  -increase water  intake, goal 64 oz /day  -decrease metamucil to 1 scoop nightly  -continue miralax, increase to TID -continue omeprazole  40mg  daily and pepcid  20mg  nightly.   All questions were answered, patient verbalized understanding and is in agreement with plan as outlined above.   Follow Up: 1 year   Jesi Jurgens L. Adrien Alberta, MSN, APRN, AGNP-C Adult-Gerontology Nurse Practitioner [facility]  I have reviewed the note and agree with the APP's assessment as described in this progress note  Samantha Cress, MD Gastroenterology and Hepatology [facility] Gastroenterology

## 2024-03-19 NOTE — Patient Instructions (Signed)
-  increase water  intake, aim for about 64 oz per day  -decrease metamucil to 1 scoop nightly  -continue miralax, increase to three times per day  -continue omeprazole  40mg  daily and pepcid  20mg  nightly.  -let me know if constipation is not improving  Follow up 1 year, sooner if new or worsening GI issues arise  It was a pleasure to see you today. I want to create trusting relationships with patients and provide genuine, compassionate, and quality care. I truly value your feedback! please be on the lookout for a survey regarding your visit with me today. I appreciate your input about our visit and your time in completing this!    Saori Umholtz L. Trei Schoch, MSN, APRN, AGNP-C Adult-Gerontology Nurse Practitioner [facility] Gastroenterology at [facility]

## 2024-03-20 ENCOUNTER — Encounter: Payer: Self-pay | Admitting: Urology

## 2024-03-20 NOTE — Patient Instructions (Signed)

## 2024-04-05 DIAGNOSIS — H2513 Age-related nuclear cataract, bilateral: Secondary | ICD-10-CM | POA: Diagnosis not present

## 2024-04-05 DIAGNOSIS — H43811 Vitreous degeneration, right eye: Secondary | ICD-10-CM | POA: Diagnosis not present

## 2024-04-06 IMAGING — XA DG FLUORO GUIDE NDL PLC/BX
3 series · 3 of 3 positions shown · non-contrast
Comparison: none

CLINICAL DATA: Chronic right shoulder pain.

[Series 1: ortho standard · 1 of 1 slices shown (1 of 3)]
[im 1/1]
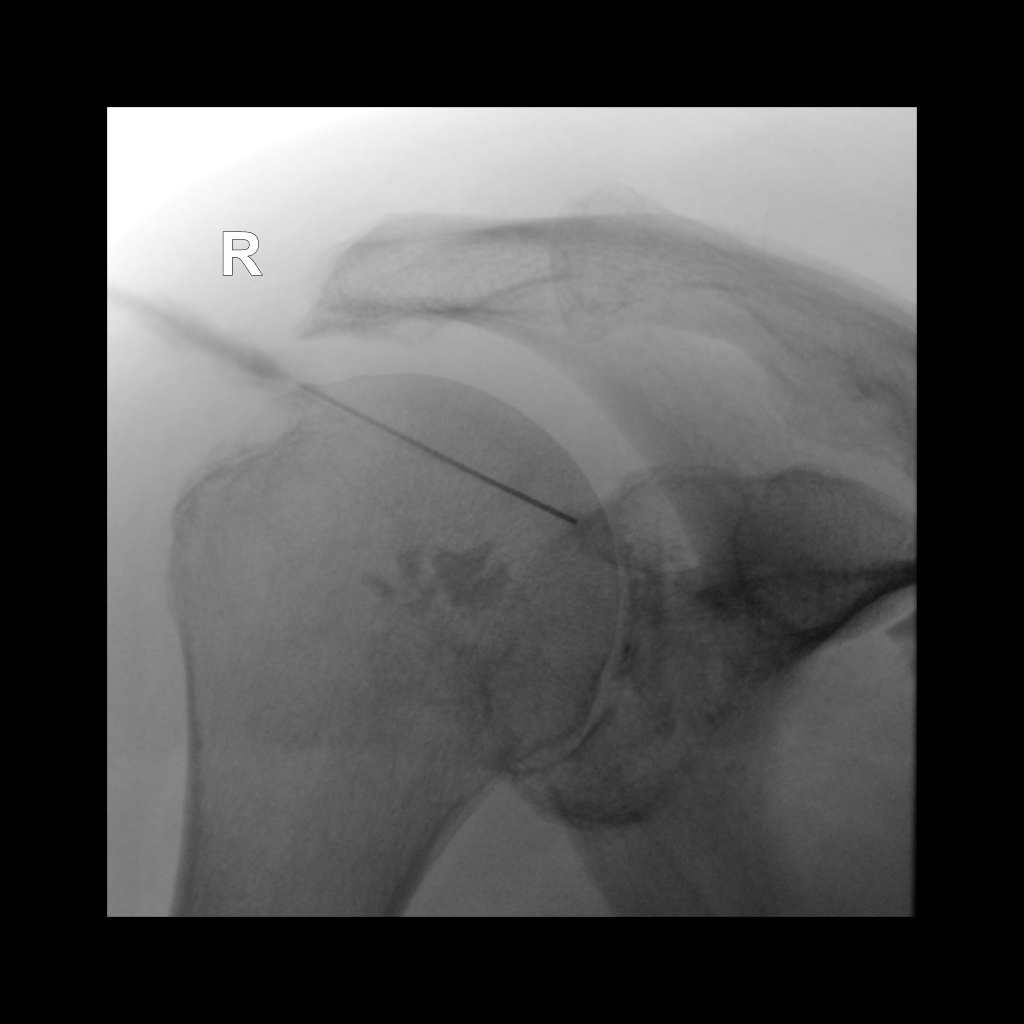

[Series 2: ortho standard · 1 of 1 slices shown (2 of 3)]
[im 1/1]
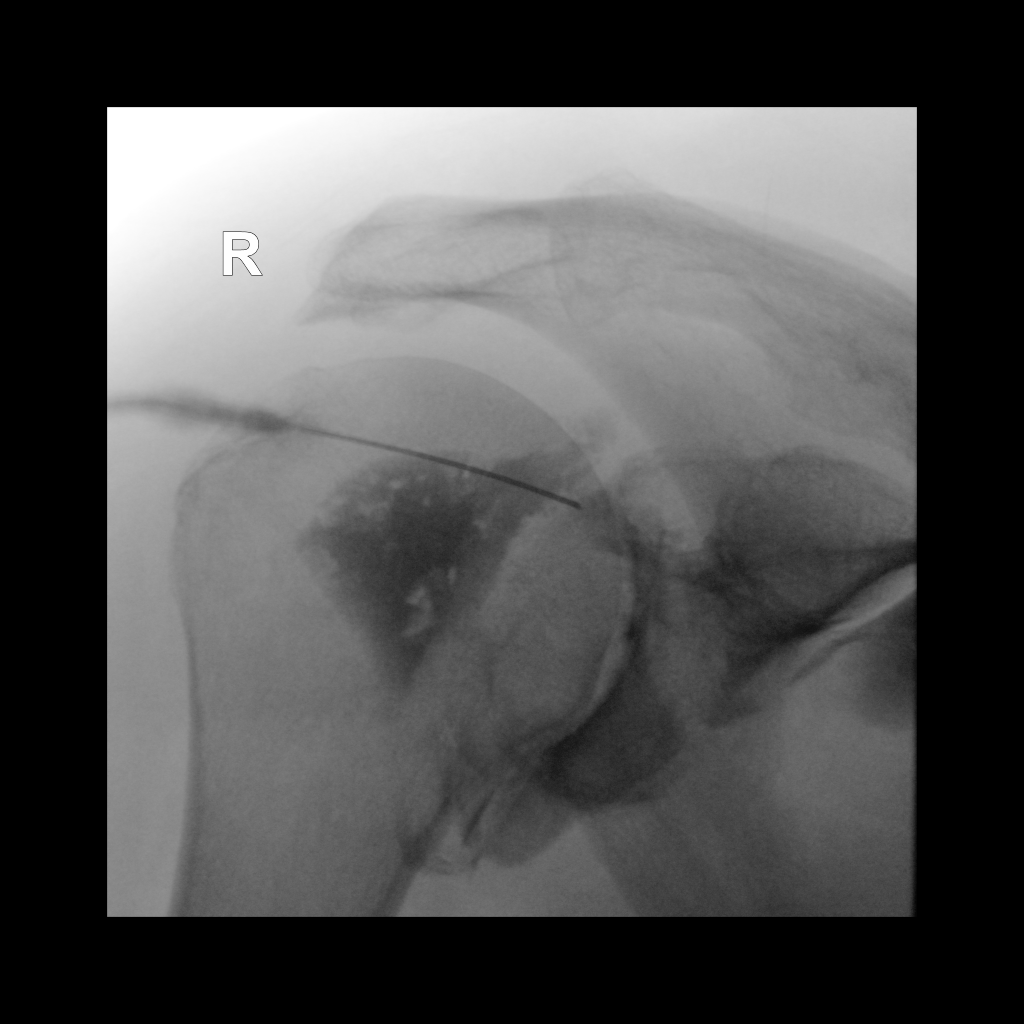

[Series 3: ortho standard · 1 of 1 slices shown (3 of 3)]
[im 1/1]
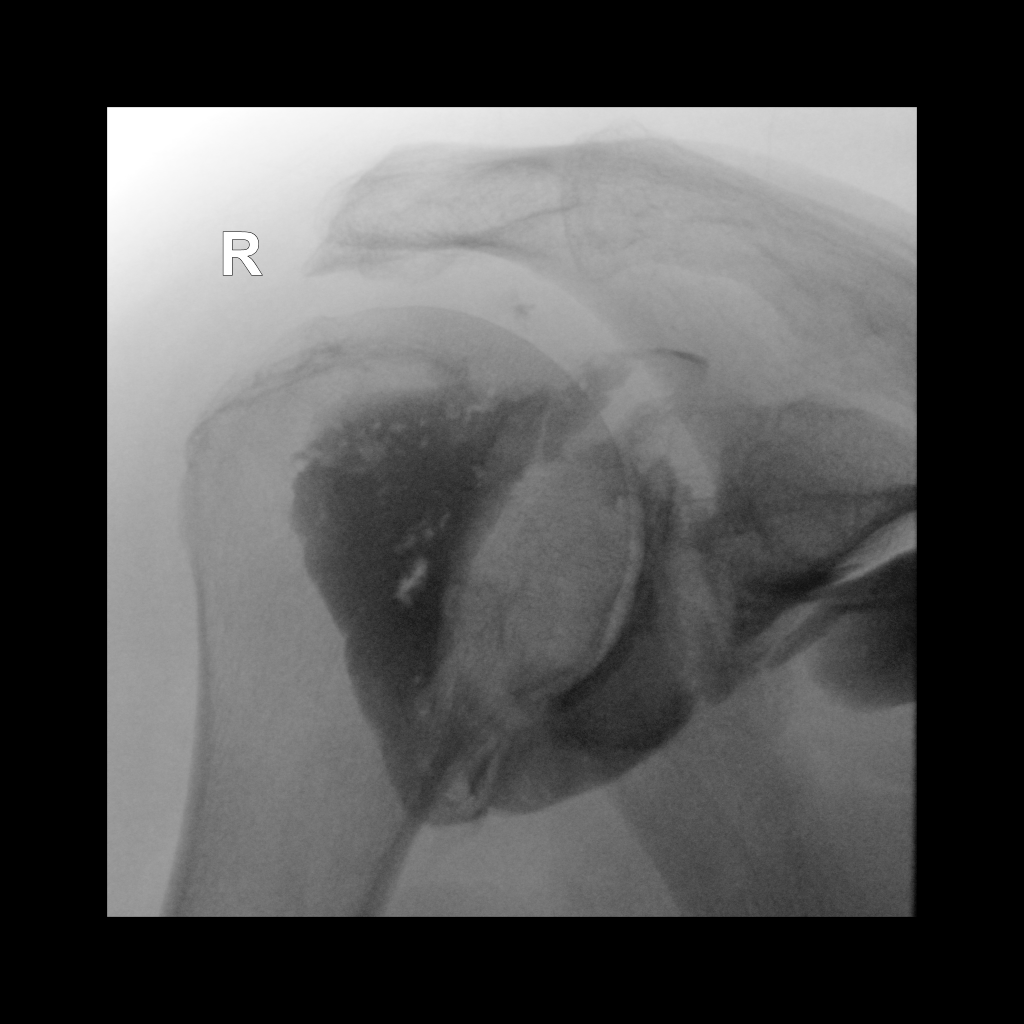

[3 of 3 positions shown; findings below may reference images not displayed]

EXAM:
SHOULDER INJECTION FOR MRI

FLUOROSCOPY:
Radiation exposure index: 3.2 mGy reference air kerma

PROCEDURE:
After a thorough discussion of risks and benefits of the procedure,
written and oral informed consent was obtained. The consent
discussion included the risk of bleeding, infection and injury to
nerves and adjacent blood vessels. Extra-articular injection was
also a possible risk discussed.

Preliminary localization was performed over the right shoulder. The
area was marked over superior medial anterior humeral head.

After prep and drape in the usual sterile fashion, the skin and
deeper subcutaneous tissues were anesthetized with 1% Lidocaine
without Epinephrine. Under fluoroscopic guidance, a 22 gauge
inch spinal needle was advanced into the joint over the superior
medial anterior humeral head using an anterior approach.
Intra-articular injection of Lidocaine was performed which flowed
freely and subsequently the joint was distended with 15 ml of a
[DATE] dilution of Multihance contrast. The MR arthrogram solution
was as follows: 10 mL 5sovue-GYY contrast agent, 0.1 mL Multihance,
10 mL saline. An end point was felt as well as the patient
experiencing pressure and the injection was discontinued, the needle
removed, and a sterile dressing applied. The patient was taken to
MRI for subsequent imaging.

The patient tolerated the procedure well and there were no
complications.
IMPRESSION: Successful right shoulder fluoroscopically guided injection.

## 2024-04-06 IMAGING — MR MR SHOULDER*R* W/CM
5 series · 38 of 40 positions shown · IV contrast (agent unspecified)
Comparison: Right humerus radiographs 04/09/2022

CLINICAL DATA: Evaluate for rotator cuff tear especially of the
infraspinatus. Right shoulder pain radiating to the arm with
decreased range of motion. Weakness and numbness for 2 months. Prior
rotator cuff repair 25 years ago.

EXAM:
MRI OF THE RIGHT SHOULDER WITH CONTRAST
TECHNIQUE: Multiplanar, multisequence MR imaging of the right shoulder was
performed following the administration of intra-articular contrast.
No ABER images were obtained due to patient pain.
CONTRAST:  See Injection Documentation.

[Series 3: T1 fat-sat · axial · 4.0mm · 0.31mm/px · z∈[-56,+74]mm · 8 of 27 slices shown (1 of 3)]
[im 1/27]
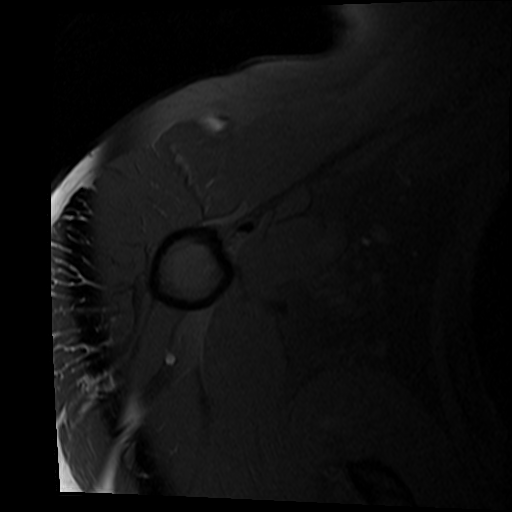
[im 3/27]
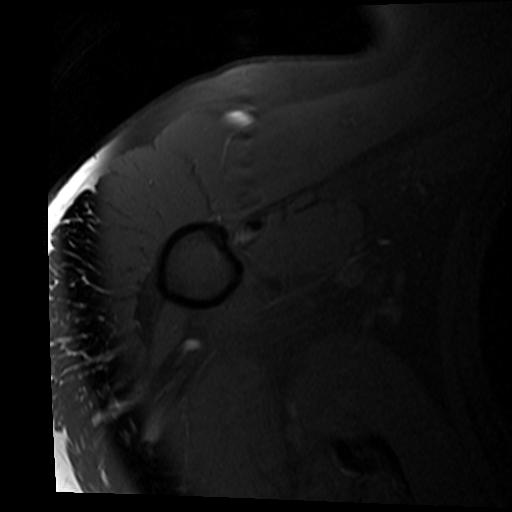
[im 9/27]
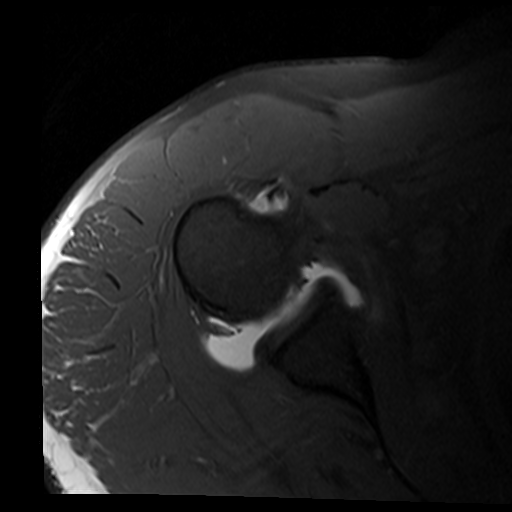
[im 12/27]
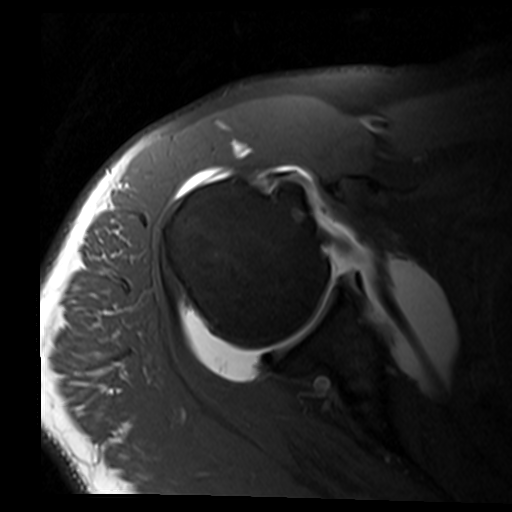
[im 15/27]
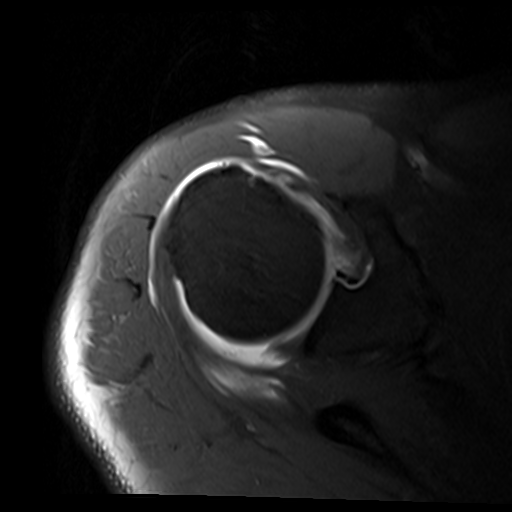
[im 18/27]
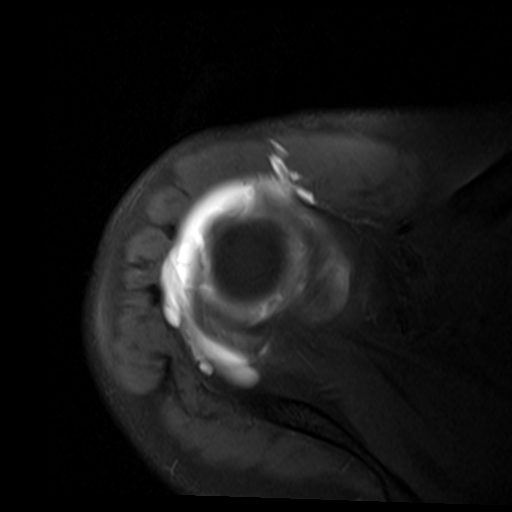
[im 24/27]
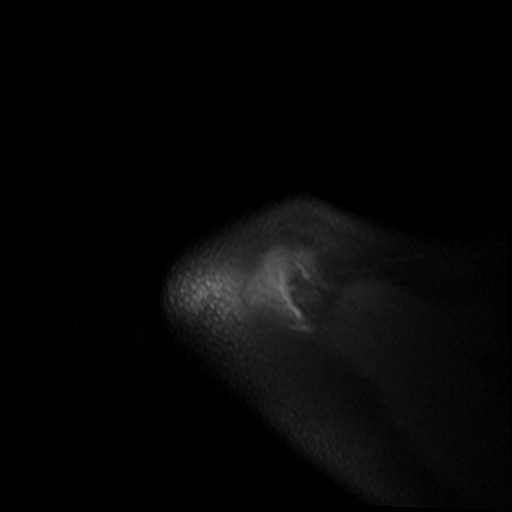
[im 27/27]
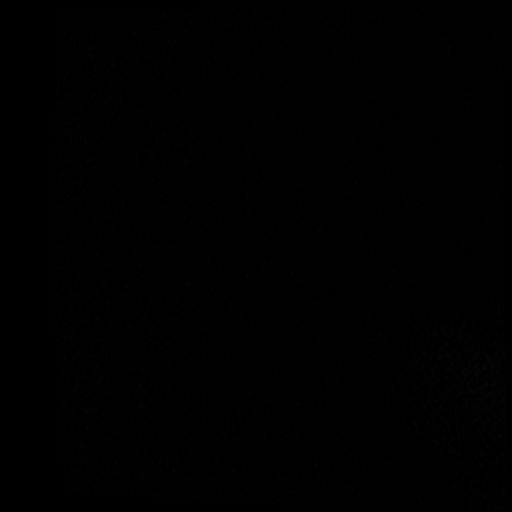

[Series 4: T2 fat-sat · oblique · 4.0mm · 0.62mm/px · 9 of 26 slices shown (1 of 2)]
[im 1/26]
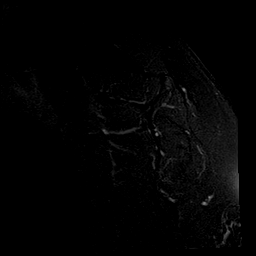
[im 4/26]
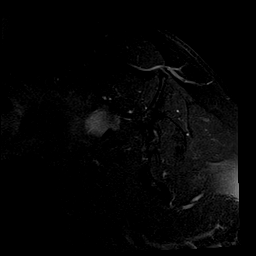
[im 7/26]
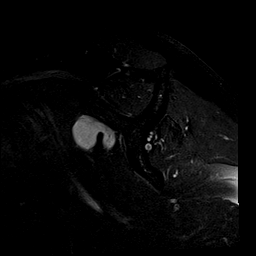
[im 10/26]
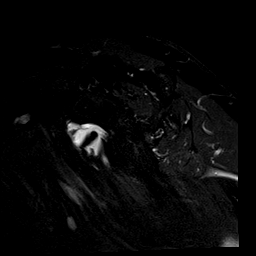
[im 13/26]
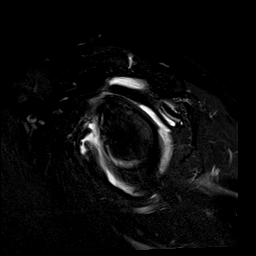
[im 16/26]
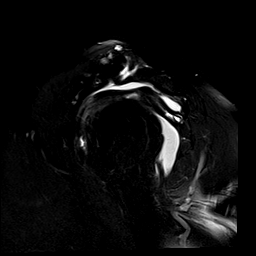
[im 19/26]
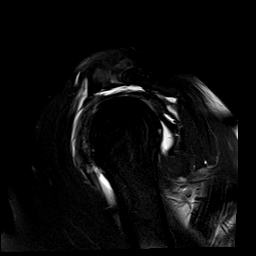
[im 22/26]
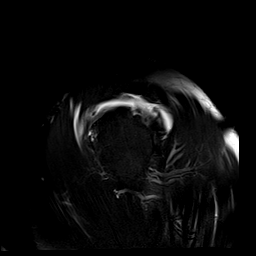
[im 26/26]
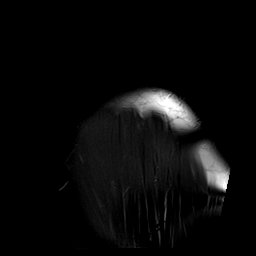

[Series 5: T1 fat-sat · oblique · 4.0mm · 0.62mm/px · 7 of 22 slices shown (2 of 3)]
[im 1/22]
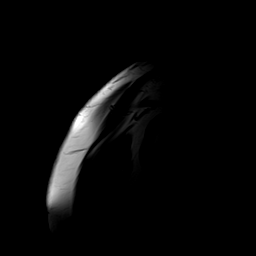
[im 4/22]
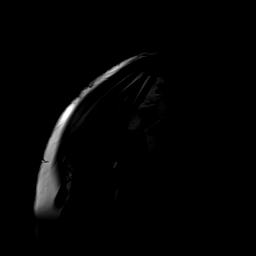
[im 8/22]
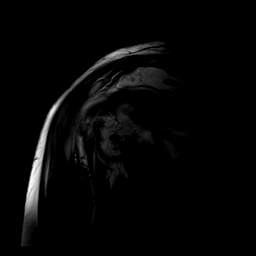
[im 11/22]
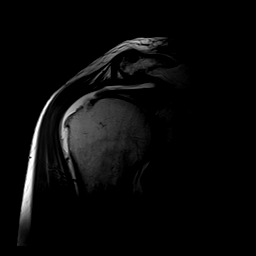
[im 15/22]
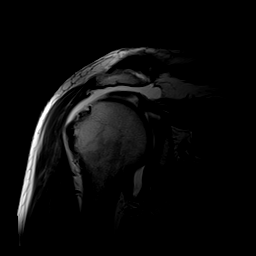
[im 18/22]
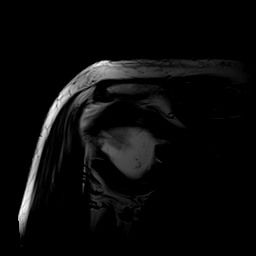
[im 22/22]
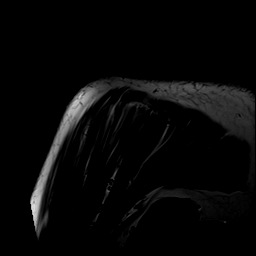

[Series 6: T1 fat-sat · oblique · 4.0mm · 0.62mm/px · 7 of 22 slices shown (3 of 3)]
[im 1/22]
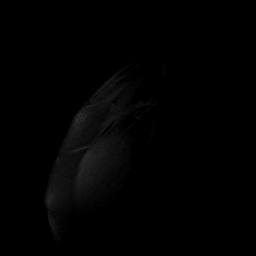
[im 4/22]
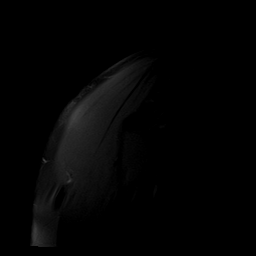
[im 8/22]
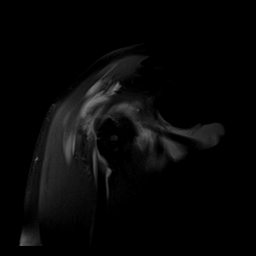
[im 11/22]
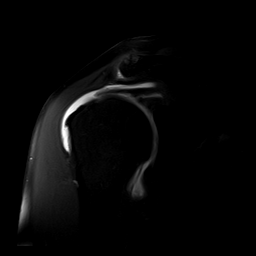
[im 15/22]
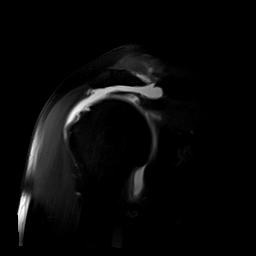
[im 18/22]
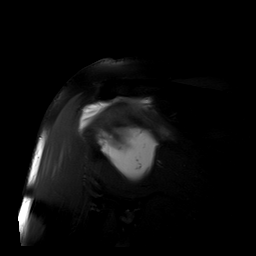
[im 22/22]
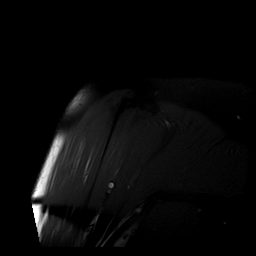

[Series 7: T2 fat-sat · oblique · 4.0mm · 0.62mm/px · 7 of 22 slices shown (2 of 2)]
[im 1/22]
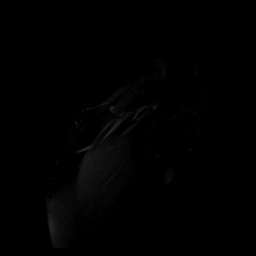
[im 4/22]
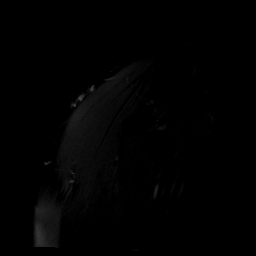
[im 8/22]
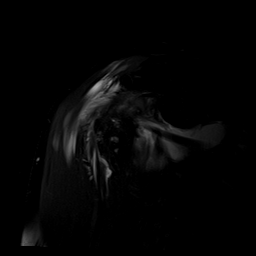
[im 11/22]
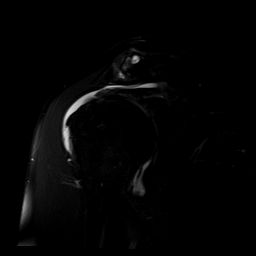
[im 15/22]
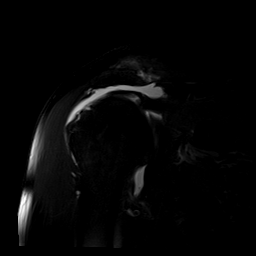
[im 18/22]
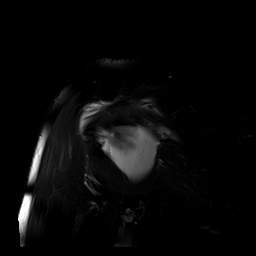
[im 22/22]
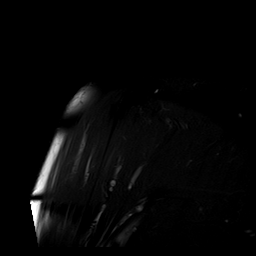

[38 of 40 positions shown; findings below may reference images not displayed]

FINDINGS: Rotator cuff: There is a large full-thickness tear of at least 90%
of the AP dimension of the supraspinatus tendon with possible mild
far anterior intact fibers (coronal series 7 images 13 and 14).
There is a full-thickness tear of at least 90% of the AP dimension
of the infraspinatus tendon with a small amount of posterior intact
fibers (coronal series 7, images 5 and 6). There is retraction of
the bursal sided supraspinatus and infraspinatus tendon fibers at
least 3.0 cm and greater retraction of the articular sided fibers,
at least 4 cm. There is a full-thickness tear of the superior 50% of
the subscapularis tendon insertion with mild interstitial
midsubstance partial-thickness tearing of the more inferior tendon
(axial series 3 images 13 through 18). The teres minor is intact.

Muscles: Mild supraspinatus and infraspinatus and mild superior
subscapularis muscle atrophy.

Biceps long head: Mild-to-moderate intermediate T2 signal tendinosis
of the proximal long head of the biceps tendon. There is a
moderately attenuated tendon seen coursing over the lesser
tuberosity into the bicipital groove (axial series 3, images 16
through 19, moderate partial-thickness tearing.

Acromioclavicular Joint: There are moderate to severe degenerative
changes of the acromioclavicular joint including joint space
narrowing, subchondral marrow edema, and peripheral osteophytosis.
Type I to II acromion.

The intra-articular contrast administered into the glenohumeral
joint for this MR arthrogram extends through the full-thickness
rotator cuff tears into the subacromial/subdeltoid bursa.

Glenohumeral Joint: Mild glenoid and humeral head cartilage
thinning.

Labrum: Mild degenerative irregularity of the superior glenoid
labrum. Minimal degenerative blunting of the peripheral aspect of
the posterosuperior glenoid labrum.

Bones:  No acute fracture.

Other: None.
IMPRESSION: 1. Large full-thickness tear of near the entire supraspinatus and
infraspinatus tendons with a small amount of anterior supraspinatus
and posterior infraspinatus intact fibers. Mild supraspinatus and
infraspinatus muscle atrophy.
2. Full-thickness tear of the superior 50% of the subscapularis
tendon insertion. Mild subscapularis muscle atrophy.
3. Mild-to-moderate proximal long head of the biceps tendinosis with
moderate partial-thickness tear of the tendon as it courses over the
lesser tuberosity into the bicipital groove.
4. Moderate to severe degenerative changes of the acromioclavicular
joint.
5. Mild glenohumeral osteoarthritis and mild degenerative changes of
the superior and posterosuperior portions of the glenoid labrum.

## 2024-04-19 DIAGNOSIS — E559 Vitamin D deficiency, unspecified: Secondary | ICD-10-CM | POA: Diagnosis not present

## 2024-04-19 DIAGNOSIS — E538 Deficiency of other specified B group vitamins: Secondary | ICD-10-CM | POA: Diagnosis not present

## 2024-04-19 DIAGNOSIS — R945 Abnormal results of liver function studies: Secondary | ICD-10-CM | POA: Diagnosis not present

## 2024-04-19 DIAGNOSIS — M159 Polyosteoarthritis, unspecified: Secondary | ICD-10-CM | POA: Diagnosis not present

## 2024-04-19 DIAGNOSIS — M5030 Other cervical disc degeneration, unspecified cervical region: Secondary | ICD-10-CM | POA: Diagnosis not present

## 2024-04-19 DIAGNOSIS — M5134 Other intervertebral disc degeneration, thoracic region: Secondary | ICD-10-CM | POA: Diagnosis not present

## 2024-04-19 DIAGNOSIS — R079 Chest pain, unspecified: Secondary | ICD-10-CM | POA: Diagnosis not present

## 2024-04-19 DIAGNOSIS — G2581 Restless legs syndrome: Secondary | ICD-10-CM | POA: Diagnosis not present

## 2024-04-19 DIAGNOSIS — G4730 Sleep apnea, unspecified: Secondary | ICD-10-CM | POA: Diagnosis not present

## 2024-04-19 DIAGNOSIS — Z6833 Body mass index (BMI) 33.0-33.9, adult: Secondary | ICD-10-CM | POA: Diagnosis not present

## 2024-04-19 DIAGNOSIS — R5383 Other fatigue: Secondary | ICD-10-CM | POA: Diagnosis not present

## 2024-04-19 DIAGNOSIS — R7309 Other abnormal glucose: Secondary | ICD-10-CM | POA: Diagnosis not present

## 2024-04-19 DIAGNOSIS — E6609 Other obesity due to excess calories: Secondary | ICD-10-CM | POA: Diagnosis not present

## 2024-04-19 DIAGNOSIS — R002 Palpitations: Secondary | ICD-10-CM | POA: Diagnosis not present

## 2024-04-23 ENCOUNTER — Encounter: Payer: Self-pay | Admitting: Cardiology

## 2024-04-23 ENCOUNTER — Other Ambulatory Visit (HOSPITAL_COMMUNITY): Payer: Self-pay | Admitting: Internal Medicine

## 2024-04-23 ENCOUNTER — Ambulatory Visit: Attending: Cardiology | Admitting: Cardiology

## 2024-04-23 VITALS — BP 150/90 | Ht 72.0 in | Wt 248.2 lb

## 2024-04-23 DIAGNOSIS — R030 Elevated blood-pressure reading, without diagnosis of hypertension: Secondary | ICD-10-CM | POA: Diagnosis not present

## 2024-04-23 DIAGNOSIS — R945 Abnormal results of liver function studies: Secondary | ICD-10-CM

## 2024-04-23 DIAGNOSIS — R002 Palpitations: Secondary | ICD-10-CM

## 2024-04-23 NOTE — Patient Instructions (Addendum)
 Medication Instructions:  Your physician recommends that you continue on your current medications as directed. Please refer to the Current Medication list given to you today.   Labwork: none  Testing/Procedures: None today  Follow-Up: 2 months  Any Other Special Instructions Will Be Listed Below (If Applicable).   Please call office [phone] in 2 weeks and update us  regarding your palpitations  If you need a refill on your cardiac medications before your next appointment, please call your pharmacy.    DASH Eating Plan DASH stands for Dietary Approaches to Stop Hypertension. The DASH eating plan is a healthy eating plan that has been shown to: Lower high blood pressure (hypertension). Reduce your risk for type 2 diabetes, heart disease, and stroke. Help with weight loss. What are tips for following this plan? Reading food labels Check food labels for the amount of salt (sodium) per serving. Choose foods with less than 5 percent of the Daily Value (DV) of sodium. In general, foods with less than 300 milligrams (mg) of sodium per serving fit into this eating plan. To find whole grains, look for the word "whole" as the first word in the ingredient list. Shopping Buy products labeled as "low-sodium" or "no salt added." Buy fresh foods. Avoid canned foods and pre-made or frozen meals. Cooking Try not to add salt when you cook. Use salt-free seasonings or herbs instead of table salt or sea salt. Check with your health care provider or pharmacist before using salt substitutes. Do not fry foods. Cook foods in healthy ways, such as baking, boiling, grilling, roasting, or broiling. Cook using oils that are good for your heart. These include olive, canola, avocado, soybean, and sunflower oil. Meal planning  Eat a balanced diet. This should include: 4 or more servings of fruits and 4 or more servings of vegetables each day. Try to fill half of your plate with fruits and  vegetables. 6-8 servings of whole grains each day. 6 or less servings of lean meat, poultry, or fish each day. 1 oz is 1 serving. A 3 oz (85 g) serving of meat is about the same size as the palm of your hand. One egg is 1 oz (28 g). 2-3 servings of low-fat dairy each day. One serving is 1 cup (237 mL). 1 serving of nuts, seeds, or beans 5 times each week. 2-3 servings of heart-healthy fats. Healthy fats called omega-3 fatty acids are found in foods such as walnuts, flaxseeds, fortified milks, and eggs. These fats are also found in cold-water  fish, such as sardines, salmon, and mackerel. Limit how much you eat of: Canned or prepackaged foods. Food that is high in trans fat, such as fried foods. Food that is high in saturated fat, such as fatty meat. Desserts and other sweets, sugary drinks, and other foods with added sugar. Full-fat dairy products. Do not salt foods before eating. Do not eat more than 4 egg yolks a week. Try to eat at least 2 vegetarian meals a week. Eat more home-cooked food and less restaurant, buffet, and fast food. Lifestyle When eating at a restaurant, ask if your food can be made with less salt or no salt. If you drink alcohol: Limit how much you have to: 0-1 drink a day if you are male. 0-2 drinks a day if you are male. Know how much alcohol is in your drink. In the [place] one drink is one 12 oz bottle of beer (355 mL), one 5 oz glass of wine (148 mL), or one 1  oz glass of hard liquor (44 mL). General information Avoid eating more than 2,300 mg of salt a day. If you have hypertension, you may need to reduce your sodium intake to 1,500 mg a day. Work with your provider to stay at a healthy body weight or lose weight. Ask what the best weight range is for you. On most days of the week, get at least 30 minutes of exercise that causes your heart to beat faster. This may include walking, swimming, or biking. Work with your provider or dietitian to adjust your eating  plan to meet your specific calorie needs. What foods should I eat? Fruits All fresh, dried, or frozen fruit. Canned fruits that are in their natural juice and do not have sugar added to them. Vegetables Fresh or frozen vegetables that are raw, steamed, roasted, or grilled. Low-sodium or reduced-sodium tomato and vegetable juice. Low-sodium or reduced-sodium tomato sauce and tomato paste. Low-sodium or reduced-sodium canned vegetables. Grains Whole-grain or whole-wheat bread. Whole-grain or whole-wheat pasta. Brown rice. Dwyane Glad. Bulgur. Whole-grain and low-sodium cereals. Pita bread. Low-fat, low-sodium crackers. Whole-wheat flour tortillas. Meats and other proteins Skinless chicken or [place]. Ground chicken or [place]. Pork with fat trimmed off. Fish and seafood. Egg whites. Dried beans, peas, or lentils. Unsalted nuts, nut butters, and seeds. Unsalted canned beans. Lean cuts of beef with fat trimmed off. Low-sodium, lean precooked or cured meat, such as sausages or meat loaves. Dairy Low-fat (1%) or fat-free (skim) milk. Reduced-fat, low-fat, or fat-free cheeses. Nonfat, low-sodium ricotta or cottage cheese. Low-fat or nonfat yogurt. Low-fat, low-sodium cheese. Fats and oils Soft margarine without trans fats. Vegetable oil. Reduced-fat, low-fat, or light mayonnaise and salad dressings (reduced-sodium). Canola, safflower, olive, avocado, soybean, and sunflower oils. Avocado. Seasonings and condiments Herbs. Spices. Seasoning mixes without salt. Other foods Unsalted popcorn and pretzels. Fat-free sweets. The items listed above may not be all the foods and drinks you can have. Talk to a dietitian to learn more. What foods should I avoid? Fruits Canned fruit in a light or heavy syrup. Fried fruit. Fruit in cream or butter sauce. Vegetables Creamed or fried vegetables. Vegetables in a cheese sauce. Regular canned vegetables that are not marked as low-sodium or reduced-sodium. Regular  canned tomato sauce and paste that are not marked as low-sodium or reduced-sodium. Regular tomato and vegetable juices that are not marked as low-sodium or reduced-sodium. Vanessa General. Olives. Grains Baked goods made with fat, such as croissants, muffins, or some breads. Dry pasta or rice meal packs. Meats and other proteins Fatty cuts of meat. Ribs. Fried meat. Helene Loader. Bologna, salami, and other precooked or cured meats, such as sausages or meat loaves, that are not lean and low in sodium. Fat from the back of a pig (fatback). Bratwurst. Salted nuts and seeds. Canned beans with added salt. Canned or smoked fish. Whole eggs or egg yolks. Chicken or [place] with skin. Dairy Whole or 2% milk, cream, and half-and-half. Whole or full-fat cream cheese. Whole-fat or sweetened yogurt. Full-fat cheese. Nondairy creamers. Whipped toppings. Processed cheese and cheese spreads. Fats and oils Butter. Stick margarine. Lard. Shortening. Ghee. Bacon fat. Tropical oils, such as coconut, palm kernel, or palm oil. Seasonings and condiments Onion salt, garlic salt, seasoned salt, table salt, and sea salt. Worcestershire sauce. Tartar sauce. Barbecue sauce. Teriyaki sauce. Soy sauce, including reduced-sodium soy sauce. Steak sauce. Canned and packaged gravies. Fish sauce. Oyster sauce. Cocktail sauce. Store-bought horseradish. Ketchup. Mustard. Meat flavorings and tenderizers. Bouillon cubes. Hot sauces. Pre-made or packaged  marinades. Pre-made or packaged taco seasonings. Relishes. Regular salad dressings. Other foods Salted popcorn and pretzels. The items listed above may not be all the foods and drinks you should avoid. Talk to a dietitian to learn more. Where to find more information National Heart, Lung, and Blood Institute (NHLBI): BuffaloDryCleaner.gl American Heart Association (AHA): heart.org Academy of Nutrition and Dietetics: eatright.org National Kidney Foundation (NKF): kidney.org This information is not intended to  replace advice given to you by your health care provider. Make sure you discuss any questions you have with your health care provider. Document Revised: 11/18/2022 Document Reviewed: 11/18/2022 Elsevier Patient Education  2024 [employer].

## 2024-04-23 NOTE — Progress Notes (Signed)
 Clinical Summary Mr. Blower is a 58 y.o.male seen today as a new consult, referred by Dr Glady Laming for the following medical problems  1.Palpitations - symptoms on and off for about 1 year, increased in frequency -feeling of heart fluttering. Lasts just a few seconds. No set pattern or trigger. Isolated episode of dizziness with an episode. No specific SOB/DOE - symptoms occur daily, usually feels at night - coffee 2 cups in AM and 2 cups in PM. Regular diet sun drop about 1 liter per day, no energy drinks. Daily iced tea - no EtOH -EKG from pcp shows NSR  - does heavy yard work, farming. No exertional symptoms.     Past Medical History:  Diagnosis Date   Dysphagia 08/10/2017   Gout    Gout 08/10/2017   Iritis    dx by Dr. Barbra Boone   Kidney stones      Allergies  Allergen Reactions   Phenergan [Promethazine Hcl] Anaphylaxis and Other (See Comments)    severe hypotension   Colchicine Other (See Comments)    Depression     Penicillins Other (See Comments)    From childhood Has patient had a PCN reaction causing immediate rash, facial/tongue/throat swelling, SOB or lightheadedness with hypotension: Unknown Has patient had a PCN reaction causing severe rash involving mucus membranes or skin necrosis: Unknown Has patient had a PCN reaction that required hospitalization Unknown Has patient had a PCN reaction occurring within the last 10 years: Unknown If all of the above answers are "NO", then may proceed with Cephalosporin us    Vancomycin Swelling and Other (See Comments)    Lips swelling     Current Outpatient Medications  Medication Sig Dispense Refill   acetaminophen -codeine  (TYLENOL  #3) 300-30 MG tablet Take 1 tablet by mouth every 6 (six) hours as needed for moderate pain (pain score 4-6). (Patient not taking: Reported on 04/23/2024) 30 tablet 0   allopurinol (ZYLOPRIM) 100 MG tablet Take 100 mg by mouth 2 (two) times daily.      carbidopa -levodopa  (SINEMET  IR)  25-100 MG tablet Take 1 tablet as needed for severe restless leg syndrome 30 tablet 2   famotidine  (PEPCID ) 20 MG tablet TAKE ONE TABLET BY MOUTH ONCE DAILY. 90 tablet 1   omeprazole  (PRILOSEC) 40 MG capsule Take 1 capsule (40 mg total) by mouth daily. 90 capsule 3   polyethylene glycol (MIRALAX / GLYCOLAX) packet Take 34 g by mouth daily. w/Coffee     ibuprofen (ADVIL) 200 MG tablet Take 400-600 mg by mouth every 8 (eight) hours as needed (pain.). (Patient not taking: Reported on 04/23/2024)     pramipexole  (MIRAPEX ) 0.5 MG tablet Take half tablet at 4pm and 1 tablet at bedtime. (Patient not taking: Reported on 04/23/2024) 45 tablet 5   Psyllium (METAMUCIL FREE & NATURAL PO) Take 2 scoop by mouth at bedtime. (Patient not taking: Reported on 04/23/2024)     No current facility-administered medications for this visit.     Past Surgical History:  Procedure Laterality Date   BACK SURGERY     BIOPSY  11/13/2018   Procedure: BIOPSY;  Surgeon: Ruby Corporal, MD;  [facility]: [facility];  Service: Endoscopy;;  terminal ileum colon   BIOPSY  05/20/2023   Procedure: BIOPSY;  Surgeon: Urban Garden, MD;  Location: [facility];  Service: Gastroenterology;;   BIOPSY  10/19/2023   Procedure: BIOPSY;  Surgeon: Urban Garden, MD;  Location: [facility];  Service: Gastroenterology;;  COLONOSCOPY N/A 03/01/2013   Procedure: COLONOSCOPY;  Surgeon: Ruby Corporal, MD;  Location: [facility];  Service: Endoscopy;  Laterality: N/A;  100   COLONOSCOPY N/A 11/13/2018   Procedure: COLONOSCOPY;  Surgeon: Ruby Corporal, MD;  Location: [facility];  Service: Endoscopy;  Laterality: N/A;  1015am   COLONOSCOPY WITH PROPOFOL  N/A 10/19/2023   Procedure: COLONOSCOPY WITH PROPOFOL ;  Surgeon: Urban Garden, MD;  Location: [facility];  Service: Gastroenterology;  Laterality: N/A;  8:15am;asa 2   EGD/ED     years ago   ESOPHAGEAL DILATION N/A 08/31/2017   Procedure:  ESOPHAGEAL DILATION;  Surgeon: Ruby Corporal, MD;  Location: [facility];  Service: Endoscopy;  Laterality: N/A;   ESOPHAGEAL DILATION N/A 05/20/2023   Procedure: ESOPHAGEAL DILATION;  Surgeon: Urban Garden, MD;  [facility]: [facility];  Service: Gastroenterology;  Laterality: N/A;  10:15am;asa 2   ESOPHAGOGASTRODUODENOSCOPY N/A 08/31/2017   Procedure: ESOPHAGOGASTRODUODENOSCOPY (EGD);  Surgeon: Ruby Corporal, MD;  [facility]: [facility];  Service: Endoscopy;  Laterality: N/A;  3:10   ESOPHAGOGASTRODUODENOSCOPY (EGD) WITH PROPOFOL  N/A 05/20/2023   Procedure: ESOPHAGOGASTRODUODENOSCOPY (EGD) WITH PROPOFOL ;  Surgeon: Urban Garden, MD;  [facility]: [facility];  Service: Gastroenterology;  Laterality: N/A;  10:15am;asa 2   NECK SURGERY     x2   POLYPECTOMY  11/13/2018   Procedure: POLYPECTOMY;  Surgeon: Ruby Corporal, MD;  Location: [facility];  Service: Endoscopy;;  colon   POLYPECTOMY  10/19/2023   Procedure: POLYPECTOMY;  Surgeon: Umberto Ganong, Bearl Limes, MD;  Location: [facility];  Service: Gastroenterology;;   Adeline Hone CUFF REPAIR Left    Rotator cuff surgery Right      Allergies  Allergen Reactions   Phenergan [Promethazine Hcl] Anaphylaxis and Other (See Comments)    severe hypotension   Colchicine Other (See Comments)    Depression     Penicillins Other (See Comments)    From childhood Has patient had a PCN reaction causing immediate rash, facial/tongue/throat swelling, SOB or lightheadedness with hypotension: Unknown Has patient had a PCN reaction causing severe rash involving mucus membranes or skin necrosis: Unknown Has patient had a PCN reaction that required hospitalization Unknown Has patient had a PCN reaction occurring within the last 10 years: Unknown If all of the above answers are "NO", then may proceed with Cephalosporin us    Vancomycin Swelling and Other (See Comments)    Lips swelling      Family History  Problem  Relation Age of Onset   Dementia Mother    Kidney cancer Father    Kidney failure Brother    Congestive Heart Failure Brother    Healthy Brother    Heart Problems Son    Healthy Son    Healthy Daughter      Social History Mr. Nault reports that he has never smoked. He has been exposed to tobacco smoke. He has never used smokeless tobacco. Mr. Maultsby reports no history of alcohol use.    Physical Examination Today's Vitals   04/23/24 0900 04/23/24 0928  BP: (!) 150/90 (!) 150/90  Weight: 248 lb 3.2 oz (112.6 kg)   Height: 6' (1.829 m)   PainSc: 2    PainLoc: Chest    Body mass index is 33.66 kg/m.  Gen: resting comfortably, no acute distress HEENT: no scleral icterus, pupils equal round and reactive, no palptable cervical adenopathy,  CV: RRR, no m/rg, no jvd Resp: Clear to auscultation bilaterally GI: abdomen is  soft, non-tender, non-distended, normal bowel sounds, no hepatosplenomegaly MSK: extremities are warm, no edema.  Skin: warm, no rash Neuro:  no focal deficits Psych: appropriate affect    Assessment and Plan   1.Palpitations - EKG at pcp and in clinic today is benign - very high caffeine intake, wean and monitor symptoms He will call us  and update us  in 2 weeks, if ongoing symptoms would plan for 2 week zio patch   2. Elevated blood pressure - elevated in clinic, home bp's and recent pcp visit was normal - monitor at this time, given information on DASH diet    Laurann Pollock, M.D.,

## 2024-04-24 ENCOUNTER — Ambulatory Visit: Payer: Medicare HMO | Admitting: Neurology

## 2024-04-27 ENCOUNTER — Ambulatory Visit: Admitting: Orthopedic Surgery

## 2024-04-27 ENCOUNTER — Encounter: Payer: Self-pay | Admitting: Orthopedic Surgery

## 2024-04-27 DIAGNOSIS — M170 Bilateral primary osteoarthritis of knee: Secondary | ICD-10-CM

## 2024-04-27 NOTE — Progress Notes (Unsigned)
 Office Visit Note   Patient: Ryan Fuller           Date of Birth: [birth year]           MRN: [id number] Visit Date: 04/27/2024 Requested by: Minus Amel, MD [id number] [place],  [place] [address] PCP: Minus Amel, MD  Subjective: Chief Complaint  Patient presents with   Right Knee - Pain   Left Knee - Pain    HPI: Ryan Fuller is a 58 y.o. male who presents to the office reporting bilateral knee pain.  Has had cortisone injections in the past which have helped him.  Denies any interval history of injury.  Does report primarily arthritic symptoms in the knees..                ROS: All systems reviewed are negative as they relate to the chief complaint within the history of present illness.  Patient denies fevers or chills.  Assessment & Plan: Visit Diagnoses:  1. Primary osteoarthritis of both knees     Plan: Impression is bilateral knee arthritis.  Prior good relief with cortisone injections.  Gel injections are not helping as much.  Cortisone injections performed today.  He will follow-up as needed.  Continue with nonweightbearing quad strengthening exercises.  Follow-Up Instructions: No follow-ups on file.   Orders:  No orders of the defined types were placed in this encounter.  No orders of the defined types were placed in this encounter.     Procedures: Large Joint Inj: bilateral knee on 04/27/2024 6:12 PM Indications: diagnostic evaluation, joint swelling and pain Details: 18 G 1.5 in needle, superolateral approach  Arthrogram: No  Medications (Right): 5 mL lidocaine  1 %; 4 mL bupivacaine  0.25 %; 40 mg triamcinolone  acetonide 40 MG/ML Medications (Left): 5 mL lidocaine  1 %; 4 mL bupivacaine  0.25 %; 40 mg triamcinolone  acetonide 40 MG/ML Outcome: tolerated well, no immediate complications Procedure, treatment alternatives, risks and benefits explained, specific risks discussed. Consent was given by the patient. Immediately prior to procedure a time  out was called to verify the correct patient, procedure, equipment, support staff and site/side marked as required. Patient was prepped and draped in the usual sterile fashion.       Clinical Data: No additional findings.  Objective: Vital Signs: There were no vitals taken for this visit.  Physical Exam:  Constitutional: Patient appears well-developed HEENT:  Head: Normocephalic Eyes:EOM are normal Neck: Normal range of motion Cardiovascular: Normal rate Pulmonary/chest: Effort normal Neurologic: Patient is alert Skin: Skin is warm Psychiatric: Patient has normal mood and affect  Ortho Exam: Ortho exam demonstrates trace effusion in both knees.  Extensor mechanism intact.  Lacks about 3 to 5 degrees of full extension in both knees.  Flexes easily past 90.  Collateral cruciate ligaments are stable.  Specialty Comments:  03/17/2022 EMG/NCS Impression: The above electrodiagnostic study is ABNORMAL and reveals evidence of a moderate Bilateral median nerve entrapment at the wrist (carpal tunnel syndrome) affecting sensory and motor components.    There is no significant electrodiagnostic evidence of any other focal nerve entrapment, brachial plexopathy or cervical radiculopathy.    Recommendations: 1.  Follow-up with referring physician. 2.  Continue current management of symptoms. 3.  Continue use of resting splint at night-time and as needed during the day. 4.  Suggest surgical evaluation.  Berkley Breech, MD ------- MRI CERVICAL SPINE WITHOUT CONTRAST   TECHNIQUE: Multiplanar, multisequence MR imaging of the cervical spine was performed. No intravenous contrast  was administered.   COMPARISON:  Previous MRI from 10/16/2021.   FINDINGS: Alignment: Straightening of the normal cervical lordosis. No significant listhesis.   Vertebrae: Postoperative changes from prior fusion at C4 through C7, with anterior plate screw fixation in place at C6-7. Vertebral body height  maintained without acute or chronic fracture. Bone marrow signal intensity heterogeneous but overall within normal limits. No worrisome osseous lesions. No abnormal marrow edema.   Cord: Normal signal and morphology.   Posterior Fossa, vertebral arteries, paraspinal tissues: Visualized brain and posterior fossa within normal limits. Craniocervical junction normal. Mild soft tissue edema adjacent to the C7 spinous process, most commonly seen with bursitis (series 7, image 10). Paraspinous soft tissues demonstrate no other acute finding. Normal flow voids seen within the vertebral arteries bilaterally.   Disc levels:   C2-C3: Normal interspace. Mild bilateral facet hypertrophy. Small extraforaminal synovial cyst again noted on the left, stable (series 8, image 6). No spinal stenosis. Foramina remain patent.   C3-C4: Mild disc bulge with uncovertebral spurring. Mild bilateral facet and ligament flavum hypertrophy. No spinal stenosis. Mild bilateral foraminal narrowing, stable.   C4-C5: Prior fusion. No residual spinal stenosis. Foramina remain patent.   C5-C6: Prior fusion. No residual spinal stenosis. Right worse than left uncovertebral and facet hypertrophy with residual moderate right C6 foraminal narrowing. Left neural foramen remains patent.   C6-C7: Prior fusion. No residual spinal stenosis. Uncovertebral spurring with residual mild left C7 foraminal stenosis. Right neural foramen remains patent.   C7-T1: Small central to right paracentral disc protrusion with annular fissure indents the ventral thecal sac (series 8, image 33). Mild facet hypertrophy. No significant spinal stenosis. Foramina remain patent.   Note made of mild noncompressive disc bulging at the T2-3 level without stenosis.   IMPRESSION: 1. Prior fusion at C4 through C7 without residual spinal stenosis. Residual moderate right C6 and mild left C7 foraminal narrowing related to uncovertebral and facet  disease. Overall appearance is stable. 2. Small central to right paracentral disc protrusion at C7-T1 without significant stenosis. This is slightly decreased in size from prior. 3. Mild bilateral foraminal narrowing at C3-4 related to disc bulge and uncovertebral disease, stable. 4. Mild soft tissue edema about the C7 spinous process, most commonly seen in the setting of bursitis.     Electronically Signed   By: Virgia Griffins M.D.   On: 11/08/2022 02:24  Imaging: No results found.   PMFS History: Patient Active Problem List   Diagnosis Date Noted   History of colonic polyps 10/19/2023   Chest pain 05/20/2023   DDD (degenerative disc disease), cervical 02/04/2022   DDD (degenerative disc disease), lumbar 02/04/2022   Primary osteoarthritis of both hands 02/04/2022   Arthritis of both acromioclavicular joints 02/04/2022   GERD (gastroesophageal reflux disease) 09/02/2021   Sigmoid diverticulitis 10/01/2019   Family hx of colon cancer 10/10/2018   Rectal bleeding 10/10/2018   History of colon polyps 10/10/2018   Dysphagia 08/10/2017   Gout 08/10/2017   Constipation 02/21/2013   Past Medical History:  Diagnosis Date   Dysphagia 08/10/2017   Gout    Gout 08/10/2017   Iritis    dx by Dr. Barbra Boone   Kidney stones     Family History  Problem Relation Age of Onset   Dementia Mother    Kidney cancer Father    Kidney failure Brother    Congestive Heart Failure Brother    Healthy Brother    Heart Problems Son    Healthy Son  Healthy Daughter     Past Surgical History:  Procedure Laterality Date   BACK SURGERY     BIOPSY  11/13/2018   Procedure: BIOPSY;  Surgeon: Ruby Corporal, MD;  [facility];  Service: Endoscopy;;  terminal ileum colon   BIOPSY  05/20/2023   Procedure: BIOPSY;  Surgeon: Urban Garden, MD;  [facility];  Service: Gastroenterology;;   BIOPSY  10/19/2023   Procedure: BIOPSY;  Surgeon: Urban Garden, MD;  [facility];  Service: Gastroenterology;;   COLONOSCOPY N/A 03/01/2013   Procedure: COLONOSCOPY;  Surgeon: Ruby Corporal, MD;  [facility];  Service: Endoscopy;  Laterality: N/A;  100   COLONOSCOPY N/A 11/13/2018   Procedure: COLONOSCOPY;  Surgeon: Ruby Corporal, MD;  [facility];  Service: Endoscopy;  Laterality: N/A;  1015am   COLONOSCOPY WITH PROPOFOL  N/A 10/19/2023   Procedure: COLONOSCOPY WITH PROPOFOL ;  Surgeon: Urban Garden, MD;  [facility];  Service: Gastroenterology;  Laterality: N/A;  8:15am;asa 2   EGD/ED     years ago   ESOPHAGEAL DILATION N/A 08/31/2017   Procedure: ESOPHAGEAL DILATION;  Surgeon: Ruby Corporal, MD;  [facility];  Service: Endoscopy;  Laterality: N/A;   ESOPHAGEAL DILATION N/A 05/20/2023   Procedure: ESOPHAGEAL DILATION;  Surgeon: Urban Garden, MD;  [facility];  Service: Gastroenterology;  Laterality: N/A;  10:15am;asa 2   ESOPHAGOGASTRODUODENOSCOPY N/A 08/31/2017   Procedure: ESOPHAGOGASTRODUODENOSCOPY (EGD);  Surgeon: Ruby Corporal, MD;  [facility];  Service: Endoscopy;  Laterality: N/A;  3:10   ESOPHAGOGASTRODUODENOSCOPY (EGD) WITH PROPOFOL  N/A 05/20/2023   Procedure: ESOPHAGOGASTRODUODENOSCOPY (EGD) WITH PROPOFOL ;  Surgeon: Urban Garden, MD;  [facility];  Service: Gastroenterology;  Laterality: N/A;  10:15am;asa 2   NECK SURGERY     x2   POLYPECTOMY  11/13/2018   Procedure: POLYPECTOMY;  Surgeon: Ruby Corporal, MD;  [facility];  Service: Endoscopy;;  colon   POLYPECTOMY  10/19/2023   Procedure: POLYPECTOMY;  Surgeon: Urban Garden, MD;  [facility];  Service: Gastroenterology;;   ROTATOR CUFF REPAIR Left    Rotator cuff surgery Right    Social History   Occupational History   Not on file  Tobacco Use   Smoking status: Never    Passive exposure:  Past   Smokeless tobacco: Never  Vaping Use   Vaping status: Never Used  Substance and Sexual Activity   Alcohol use: No   Drug use: No   Sexual activity: Not on file

## 2024-04-28 MED ORDER — BUPIVACAINE HCL 0.25 % IJ SOLN
4.0000 mL | INTRAMUSCULAR | Status: AC | PRN
  Administered 2024-04-27: 4 mL via INTRA_ARTICULAR

## 2024-04-28 MED ORDER — TRIAMCINOLONE ACETONIDE 40 MG/ML IJ SUSP
40.0000 mg | INTRAMUSCULAR | Status: AC | PRN
  Administered 2024-04-27: 40 mg via INTRA_ARTICULAR

## 2024-04-28 MED ORDER — LIDOCAINE HCL 1 % IJ SOLN
5.0000 mL | INTRAMUSCULAR | Status: AC | PRN
Start: 2024-04-27 — End: 2024-04-27
  Administered 2024-04-27: 5 mL

## 2024-04-28 MED ORDER — LIDOCAINE HCL 1 % IJ SOLN
5.0000 mL | INTRAMUSCULAR | Status: AC | PRN
  Administered 2024-04-27: 5 mL

## 2024-04-30 ENCOUNTER — Other Ambulatory Visit (INDEPENDENT_AMBULATORY_CARE_PROVIDER_SITE_OTHER): Payer: Self-pay | Admitting: Gastroenterology

## 2024-04-30 ENCOUNTER — Ambulatory Visit (HOSPITAL_COMMUNITY)
Admission: RE | Admit: 2024-04-30 | Discharge: 2024-04-30 | Disposition: A | Discharge status: Home / Self Care | Source: Ambulatory Visit | Attending: Internal Medicine | Admitting: Internal Medicine

## 2024-04-30 DIAGNOSIS — R945 Abnormal results of liver function studies: Secondary | ICD-10-CM | POA: Diagnosis not present

## 2024-04-30 DIAGNOSIS — K7689 Other specified diseases of liver: Secondary | ICD-10-CM | POA: Diagnosis not present

## 2024-04-30 DIAGNOSIS — Z9049 Acquired absence of other specified parts of digestive tract: Secondary | ICD-10-CM | POA: Diagnosis not present

## 2024-07-11 NOTE — Progress Notes (Unsigned)
   Cardiology Office Note    Date:  07/12/2024  ID:  Ryan Fuller, DOB November 27, 1965, MRN [id number] Cardiologist: Alvan Carrier, MD Cardiology APP:  Johnson Laymon HERO, PA-C { :  History of Present Illness:    Ryan Fuller is a 58 y.o. male with past medical history of gout, nephrolithiasis and palpitations who presents to the office today for 66-month follow-up.  He was examined by Dr. Alvan in 04/2024 as a new patient referral for palpitations which would last for a few seconds and spontaneously resolve. He was consuming several cups of coffee a day but no alcohol use. He denied any recent chest pain or dyspnea on exertion. His EKG was benign and given his very high caffeine consumption, was recommended to wean this and monitor symptoms. If ongoing symptoms, was recommended to plan for a 2-week Zio patch.  In talking with the patient and his wife today, he reports his palpitations significantly improved following reduction of caffeine intake but he still has occasional symptoms at times. He switched to decaffeinated coffee and no longer consumes caffeinated sodas. Mostly consumes ginger ale. Still has occasional palpitations which last for about a minute a few times a week and then spontaneously resolve. No associated dizziness or presyncope. He remains active around his home and denies any recent chest pain, dyspnea on exertion, orthopnea, PND or pitting edema.  Studies Reviewed:   EKG: EKG is not ordered today. EKG from 04/23/2024 is reviewed and shows NSR, HR 79 with no acute ST changes.   Labs: 04/19/2024 Hemoglobin A1c 5.9 TSH 1.88 Na+138 K+ 4.2 Hgb 14.5   Physical Exam:   VS:  BP 129/75 (BP Location: Right Arm, Cuff Size: Large)   Pulse 76   Ht 6' (1.829 m)   Wt 247 lb (112 kg)   SpO2 98%   BMI 33.50 kg/m    Wt Readings from Last 3 Encounters:  07/12/24 247 lb (112 kg)  04/23/24 248 lb 3.2 oz (112.6 kg)  03/19/24 245 lb (111.1 kg)     GEN: Well nourished, well  developed male appearing in no acute distress NECK: No JVD; No carotid bruits CARDIAC: RRR, no murmurs, rubs, gallops RESPIRATORY:  Clear to auscultation without rales, wheezing or rhonchi  ABDOMEN: Appears non-distended. No obvious abdominal masses. EXTREMITIES: No clubbing or cyanosis. No pitting edema.  Distal pedal pulses are 2+ bilaterally.   Assessment and Plan:   1. Palpitations - As discussed above, symptoms have improved with reduction of caffeine consumption. Labs were checked in 04/2024 and reassuring. Based off his description, I suspect symptoms are due to PAC's or PVC's. He prefers to wear a monitor for definitive evaluation and we will arrange for a 2-week Zio patch. If overall reassuring, he prefers to avoid medical therapy. If found to have a high PAC/PVC burden or significant arrhythmias, would plan for an echocardiogram to assess for any structural abnormalities.   Disposition: Will determine follow-up based off monitor results. If reassuring, can likely follow-up as needed.   Signed, Laymon HERO Johnson, PA-C

## 2024-07-12 ENCOUNTER — Encounter: Payer: Self-pay | Admitting: Student

## 2024-07-12 ENCOUNTER — Ambulatory Visit: Attending: Student | Admitting: Student

## 2024-07-12 ENCOUNTER — Ambulatory Visit (INDEPENDENT_AMBULATORY_CARE_PROVIDER_SITE_OTHER)

## 2024-07-12 VITALS — BP 129/75 | HR 76 | Ht 72.0 in | Wt 247.0 lb

## 2024-07-12 DIAGNOSIS — R002 Palpitations: Secondary | ICD-10-CM

## 2024-07-12 NOTE — Patient Instructions (Signed)
 Medication Instructions:  Your physician recommends that you continue on your current medications as directed. Please refer to the Current Medication list given to you today.  *If you need a refill on your cardiac medications before your next appointment, please call your pharmacy*  Lab Work: NONE   If you have labs (blood work) drawn today and your tests are completely normal, you will receive your results only by: MyChart Message (if you have MyChart) OR A paper copy in the mail If you have any lab test that is abnormal or we need to change your treatment, we will call you to review the results.  Testing/Procedures: ZIO XT- Long Term Monitor Instructions   Your physician has requested you wear your ZIO patch monitor___14____days. You may remove on 07/26/24.   This is a single patch monitor.  [employer] supplies one patch monitor per enrollment.  Additional stickers are not available.   Please do not apply patch if you will be having a Nuclear Stress Test, Echocardiogram, Cardiac CT, MRI, or Chest Xray during the time frame you would be wearing the monitor. The patch cannot be worn during these tests.  You cannot remove and re-apply the ZIO XT patch monitor.       Do not shower for the first 24 hours.  You may shower after the first 24 hours.   Press button if you feel a symptom. You will hear a small click.  Record Date, Time and Symptom in the Patient Log Book.   When you are ready to remove patch, follow instructions on last 2 pages of Patient Log Book.  Stick patch monitor onto last page of Patient Log Book.   Place Patient Log Book in [place] box.  Use locking tab on box and tape box closed securely.  The [employer] and [employer] has [employer] on it.  Please place in mailbox as soon as possible.  Your physician should have your test results approximately 7 days after the monitor has been mailed back to [facility].   Call [facility] Customer Care at [phone] if you have  questions regarding your ZIO XT patch monitor.  Call them immediately if you see an orange light blinking on your monitor.   If your monitor falls off in less than 4 days contact our Monitor department at [phone].  If your monitor becomes loose or falls off after 4 days call [employer] at [phone] for suggestions on securing your monitor.    Follow-Up: At [facility], you and your health needs are our priority.  As part of our continuing mission to provide you with exceptional heart care, our providers are all part of one team.  This team includes your primary Cardiologist (physician) and Advanced Practice Providers or APPs (Physician Assistants and Nurse Practitioners) who all work together to provide you with the care you need, when you need it.  Your next appointment:    To Be Determined   Provider:   You may see Alvan Carrier, MD or one of the following Advanced Practice Providers on your designated Care Team:   Laymon Qua, PA-C  Scotesia Aguanga, [place] Olivia Pavy, [place]     We recommend signing up for the patient portal called MyChart.  Sign up information is provided on this After Visit Summary.  MyChart is used to connect with patients for Virtual Visits (Telemedicine).  Patients are able to view lab/test results, encounter notes, upcoming appointments, etc.  Non-urgent messages can be sent to your provider as well.  To learn more about what you can do with MyChart, go to [email or web address].   Other Instructions Thank you for choosing [facility]!

## 2024-07-31 DIAGNOSIS — R002 Palpitations: Secondary | ICD-10-CM | POA: Diagnosis not present

## 2024-08-01 ENCOUNTER — Encounter: Payer: Self-pay | Admitting: Orthopedic Surgery

## 2024-08-01 ENCOUNTER — Ambulatory Visit: Admitting: Orthopedic Surgery

## 2024-08-01 DIAGNOSIS — M170 Bilateral primary osteoarthritis of knee: Secondary | ICD-10-CM

## 2024-08-01 MED ORDER — LIDOCAINE HCL 1 % IJ SOLN
5.0000 mL | INTRAMUSCULAR | Status: AC | PRN
  Administered 2024-08-01: 5 mL

## 2024-08-01 MED ORDER — TRIAMCINOLONE ACETONIDE 40 MG/ML IJ SUSP
40.0000 mg | INTRAMUSCULAR | Status: AC | PRN
  Administered 2024-08-01: 40 mg via INTRA_ARTICULAR

## 2024-08-01 MED ORDER — BUPIVACAINE HCL 0.25 % IJ SOLN
4.0000 mL | INTRAMUSCULAR | Status: AC | PRN
  Administered 2024-08-01: 4 mL via INTRA_ARTICULAR

## 2024-08-01 NOTE — Progress Notes (Signed)
 Office Visit Note   Patient: Ryan Fuller           Date of Birth: 07-30-1966           MRN: [id number] Visit Date: 08/01/2024 Requested by: Marvine Rush, MD [address] [place],  [place] [address] PCP: Marvine Rush, MD  Subjective: Chief Complaint  Patient presents with   Right Knee - Pain   Left Knee - Pain    HPI: Ryan Fuller is a 58 y.o. male who presents to the office reporting bilateral knee pain.  Patient states his knees are giving him a fit.  He had bilateral cortisone injection 04/27/2024 which gave him only about 3 weeks of relief.  His back is flaring up as well.  Does report decreased walking endurance..                ROS: All systems reviewed are negative as they relate to the chief complaint within the history of present illness.  Patient denies fevers or chills.  Assessment & Plan: Visit Diagnoses:  1. Primary osteoarthritis of both knees     Plan: Impression is bilateral knee effusion with arthritis.  Bilateral knee aspiration and injection performed today.  Will preapproved him for gel shots as well.  Could get him on a cadence of alternating gel and cortisone injections.  May need knee replacement sometime in the future. This patient is diagnosed with osteoarthritis of the knee(s).    Radiographs show evidence of joint space narrowing, osteophytes, subchondral sclerosis and/or subchondral cysts.  This patient has knee pain which interferes with functional and activities of daily living.    This patient has experienced inadequate response, adverse effects and/or intolerance with conservative treatments such as acetaminophen , NSAIDS, topical creams, physical therapy or regular exercise, knee bracing and/or weight loss.   This patient has experienced inadequate response or has a contraindication to intra articular steroid injections for at least 3 months.   This patient is not scheduled to have a total knee replacement within 6 months of starting  treatment with viscosupplementation.   Follow-Up Instructions: No follow-ups on file.   Orders:  No orders of the defined types were placed in this encounter.  No orders of the defined types were placed in this encounter.     Procedures: Large Joint Inj: bilateral knee on 08/01/2024 2:44 PM Indications: diagnostic evaluation, joint swelling and pain Details: 18 G 1.5 in needle, superolateral approach  Arthrogram: No  Medications (Right): 5 mL lidocaine  1 %; 4 mL bupivacaine  0.25 %; 40 mg triamcinolone  acetonide 40 MG/ML Medications (Left): 5 mL lidocaine  1 %; 4 mL bupivacaine  0.25 %; 40 mg triamcinolone  acetonide 40 MG/ML Outcome: tolerated well, no immediate complications Procedure, treatment alternatives, risks and benefits explained, specific risks discussed. Consent was given by the patient. Immediately prior to procedure a time out was called to verify the correct patient, procedure, equipment, support staff and site/side marked as required. Patient was prepped and draped in the usual sterile fashion.       Clinical Data: No additional findings.  Objective: Vital Signs: There were no vitals taken for this visit.  Physical Exam:  Constitutional: Patient appears well-developed HEENT:  Head: Normocephalic Eyes:EOM are normal Neck: Normal range of motion Cardiovascular: Normal rate Pulmonary/chest: Effort normal Neurologic: Patient is alert Skin: Skin is warm Psychiatric: Patient has normal mood and affect  Ortho Exam: Ortho exam demonstrates mild effusion in both knees.  He has about a 5 degree flexion contracture in  both knees but has full flexion.  Extensor mechanism intact.  No groin pain with internal/external Tatian of the leg.  Pedal pulses intact.  Has medial greater than lateral joint line tenderness but stable collateral and cruciate ligaments.  Specialty Comments:  03/17/2022 EMG/NCS Impression: The above electrodiagnostic study is ABNORMAL and reveals  evidence of a moderate Bilateral median nerve entrapment at the wrist (carpal tunnel syndrome) affecting sensory and motor components.    There is no significant electrodiagnostic evidence of any other focal nerve entrapment, brachial plexopathy or cervical radiculopathy.    Recommendations: 1.  Follow-up with referring physician. 2.  Continue current management of symptoms. 3.  Continue use of resting splint at night-time and as needed during the day. 4.  Suggest surgical evaluation.  Prentice MARLA Masters, MD ------- MRI CERVICAL SPINE WITHOUT CONTRAST   TECHNIQUE: Multiplanar, multisequence MR imaging of the cervical spine was performed. No intravenous contrast was administered.   COMPARISON:  Previous MRI from 10/16/2021.   FINDINGS: Alignment: Straightening of the normal cervical lordosis. No significant listhesis.   Vertebrae: Postoperative changes from prior fusion at C4 through C7, with anterior plate screw fixation in place at C6-7. Vertebral body height maintained without acute or chronic fracture. Bone marrow signal intensity heterogeneous but overall within normal limits. No worrisome osseous lesions. No abnormal marrow edema.   Cord: Normal signal and morphology.   Posterior Fossa, vertebral arteries, paraspinal tissues: Visualized brain and posterior fossa within normal limits. Craniocervical junction normal. Mild soft tissue edema adjacent to the C7 spinous process, most commonly seen with bursitis (series 7, image 10). Paraspinous soft tissues demonstrate no other acute finding. Normal flow voids seen within the vertebral arteries bilaterally.   Disc levels:   C2-C3: Normal interspace. Mild bilateral facet hypertrophy. Small extraforaminal synovial cyst again noted on the left, stable (series 8, image 6). No spinal stenosis. Foramina remain patent.   C3-C4: Mild disc bulge with uncovertebral spurring. Mild bilateral facet and ligament flavum hypertrophy. No  spinal stenosis. Mild bilateral foraminal narrowing, stable.   C4-C5: Prior fusion. No residual spinal stenosis. Foramina remain patent.   C5-C6: Prior fusion. No residual spinal stenosis. Right worse than left uncovertebral and facet hypertrophy with residual moderate right C6 foraminal narrowing. Left neural foramen remains patent.   C6-C7: Prior fusion. No residual spinal stenosis. Uncovertebral spurring with residual mild left C7 foraminal stenosis. Right neural foramen remains patent.   C7-T1: Small central to right paracentral disc protrusion with annular fissure indents the ventral thecal sac (series 8, image 33). Mild facet hypertrophy. No significant spinal stenosis. Foramina remain patent.   Note made of mild noncompressive disc bulging at the T2-3 level without stenosis.   IMPRESSION: 1. Prior fusion at C4 through C7 without residual spinal stenosis. Residual moderate right C6 and mild left C7 foraminal narrowing related to uncovertebral and facet disease. Overall appearance is stable. 2. Small central to right paracentral disc protrusion at C7-T1 without significant stenosis. This is slightly decreased in size from prior. 3. Mild bilateral foraminal narrowing at C3-4 related to disc bulge and uncovertebral disease, stable. 4. Mild soft tissue edema about the C7 spinous process, most commonly seen in the setting of bursitis.     Electronically Signed   By: Morene Hoard M.D.   On: 11/08/2022 02:24  Imaging: No results found.   PMFS History: Patient Active Problem List   Diagnosis Date Noted   History of colonic polyps 10/19/2023   Chest pain 05/20/2023   DDD (degenerative  disc disease), cervical 02/04/2022   DDD (degenerative disc disease), lumbar 02/04/2022   Primary osteoarthritis of both hands 02/04/2022   Arthritis of both acromioclavicular joints 02/04/2022   GERD (gastroesophageal reflux disease) 09/02/2021   Sigmoid diverticulitis  10/01/2019   Family hx of colon cancer 10/10/2018   Rectal bleeding 10/10/2018   History of colon polyps 10/10/2018   Dysphagia 08/10/2017   Gout 08/10/2017   Constipation 02/21/2013   Past Medical History:  Diagnosis Date   Dysphagia 08/10/2017   Gout    Gout 08/10/2017   Iritis    dx by Dr. Darroll   Kidney stones     Family History  Problem Relation Age of Onset   Dementia Mother    Kidney cancer Father    Kidney failure Brother    Congestive Heart Failure Brother    Healthy Brother    Heart Problems Son    Healthy Son    Healthy Daughter     Past Surgical History:  Procedure Laterality Date   BACK SURGERY     BIOPSY  11/13/2018   Procedure: BIOPSY;  Surgeon: Golda Claudis PENNER, MD;  [facility];  Service: Endoscopy;;  terminal ileum colon   BIOPSY  05/20/2023   Procedure: BIOPSY;  Surgeon: Eartha Angelia Sieving, MD;  [facility]: [facility];  Service: Gastroenterology;;   BIOPSY  10/19/2023   Procedure: BIOPSY;  Surgeon: Eartha Angelia Sieving, MD;  [facility]: [facility];  Service: Gastroenterology;;   COLONOSCOPY N/A 03/01/2013   Procedure: COLONOSCOPY;  Surgeon: Claudis PENNER Golda, MD;  [facility]: [facility];  Service: Endoscopy;  Laterality: N/A;  100   COLONOSCOPY N/A 11/13/2018   Procedure: COLONOSCOPY;  Surgeon: Golda Claudis PENNER, MD;  [facility]: [facility];  Service: Endoscopy;  Laterality: N/A;  1015am   COLONOSCOPY WITH PROPOFOL  N/A 10/19/2023   Procedure: COLONOSCOPY WITH PROPOFOL ;  Surgeon: Eartha Angelia Sieving, MD;  [facility]: [facility];  Service: Gastroenterology;  Laterality: N/A;  8:15am;asa 2   EGD/ED     years ago   ESOPHAGEAL DILATION N/A 08/31/2017   Procedure: ESOPHAGEAL DILATION;  Surgeon: Golda Claudis PENNER, MD;  [facility]: [facility];  Service: Endoscopy;  Laterality: N/A;   ESOPHAGEAL DILATION N/A 05/20/2023   Procedure: ESOPHAGEAL DILATION;  Surgeon: Eartha Angelia Sieving, MD;  [facility]: [facility];  Service:  Gastroenterology;  Laterality: N/A;  10:15am;asa 2   ESOPHAGOGASTRODUODENOSCOPY N/A 08/31/2017   Procedure: ESOPHAGOGASTRODUODENOSCOPY (EGD);  Surgeon: Golda Claudis PENNER, MD;  [facility];  Service: Endoscopy;  Laterality: N/A;  3:10   ESOPHAGOGASTRODUODENOSCOPY (EGD) WITH PROPOFOL  N/A 05/20/2023   Procedure: ESOPHAGOGASTRODUODENOSCOPY (EGD) WITH PROPOFOL ;  Surgeon: Eartha Angelia Sieving, MD;  [facility];  Service: Gastroenterology;  Laterality: N/A;  10:15am;asa 2   NECK SURGERY     x2   POLYPECTOMY  11/13/2018   Procedure: POLYPECTOMY;  Surgeon: Golda Claudis PENNER, MD;  [facility];  Service: Endoscopy;;  colon   POLYPECTOMY  10/19/2023   Procedure: POLYPECTOMY;  Surgeon: Eartha Angelia Sieving, MD;  [facility];  Service: Gastroenterology;;   ROTATOR CUFF REPAIR Left    Rotator cuff surgery Right    Social History   Occupational History   Not on file  Tobacco Use   Smoking status: Never    Passive exposure: Past   Smokeless tobacco: Never  Vaping Use   Vaping status: Never Used  Substance and Sexual Activity   Alcohol use: No   Drug  use: No   Sexual activity: Not on file

## 2024-08-07 ENCOUNTER — Ambulatory Visit: Admitting: Neurology

## 2024-08-07 ENCOUNTER — Encounter: Payer: Self-pay | Admitting: Neurology

## 2024-08-07 VITALS — BP 108/70 | HR 81 | Ht 72.0 in | Wt 239.0 lb

## 2024-08-07 DIAGNOSIS — G4761 Periodic limb movement disorder: Secondary | ICD-10-CM | POA: Diagnosis not present

## 2024-08-07 DIAGNOSIS — G2581 Restless legs syndrome: Secondary | ICD-10-CM

## 2024-08-07 MED ORDER — PRAMIPEXOLE DIHYDROCHLORIDE 0.5 MG PO TABS: ORAL_TABLET | ORAL | 5 refills | Status: DC

## 2024-08-07 NOTE — Progress Notes (Signed)
 Follow-up Visit   Date: 08/07/2024    Ryan Fuller MRN: [id number] DOB: 08/17/1966    Ryan Fuller is a 58 y.o. right-handed Caucasian male with GERD, s/p lumbar surgery, s/p cervical surgery and gout returning to the clinic for follow-up of restless leg syndrome.  The patient was accompanied to the clinic by wife who also provides collateral information.    IMPRESSION/PLAN: Restless leg syndrome.  Previously tried:  ropinirole, rotigotine, gabapentin  Periodic limb movement disorder  Take mirapex  0.25mg  at 4pm and at bedtime.  OK to take extra 0.25mg  for severe symptoms. Alternatively, he can try carbidopa -levadopa prn.  This was prescribed at his last visit.   Return to clinic in 4 months  --------------------------------------------- History of present illness: For the past 2-3 years, he reports having restless sensation predominately in the legs.  Symptoms at worse in the evening and at bedtime.  Wife also reports that he kicks his legs at lot at night.  She had a video recording of this.  He previously tried gabapentin , ropinirole, and neurpro.  He takes mirapex  0.5mg  at 5p and 0.5mg  at bedtime for the past several months, but reports that it makes him very sleepy and has erectile dysfunction.  His symptoms are well-controlled and he is not waking up or kicking his legs as much as night.     He is retired and lives with wife.  He used to work for [employer].   UPDATE 08/07/2024:  He is here for follow-up visit. He takes mirapex  0.25mg  at 5pm and 0.25mg  at bedtime.  He wakes up around two times per week with worsening symptoms. He has not tried taking sinemet .     Medications:  Current Outpatient Medications on File Prior to Visit  Medication Sig Dispense Refill   allopurinol (ZYLOPRIM) 100 MG tablet Take 100 mg by mouth 2 (two) times daily.      famotidine  (PEPCID ) 20 MG tablet TAKE ONE TABLET BY MOUTH ONCE DAILY. 90 tablet 1   omeprazole  (PRILOSEC) 40 MG capsule  Take 1 capsule (40 mg total) by mouth daily. 90 capsule 3   polyethylene glycol (MIRALAX / GLYCOLAX) packet Take 34 g by mouth daily. w/Coffee     pramipexole  (MIRAPEX ) 0.5 MG tablet Take half tablet at 4pm and 1 tablet at bedtime. 45 tablet 5   ibuprofen (ADVIL) 200 MG tablet Take 400-600 mg by mouth every 8 (eight) hours as needed (pain.).     No current facility-administered medications on file prior to visit.    Allergies:  Allergies  Allergen Reactions   Phenergan [Promethazine Hcl] Anaphylaxis and Other (See Comments)    severe hypotension   Colchicine Other (See Comments)    Depression     Penicillins Other (See Comments)    From childhood Has patient had a PCN reaction causing immediate rash, facial/tongue/throat swelling, SOB or lightheadedness with hypotension: Unknown Has patient had a PCN reaction causing severe rash involving mucus membranes or skin necrosis: Unknown Has patient had a PCN reaction that required hospitalization Unknown Has patient had a PCN reaction occurring within the last 10 years: Unknown If all of the above answers are NO, then may proceed with Cephalosporin us    Vancomycin Swelling and Other (See Comments)    Lips swelling    Vital Signs:  BP 108/70   Pulse 81   Ht 6' (1.829 m)   Wt 239 lb (108.4 kg)   SpO2 98%   BMI 32.41 kg/m   Neurological Exam:  MENTAL STATUS including orientation to time, place, person, recent and remote memory, attention span and concentration, language, and fund of knowledge is normal.  Speech is not dysarthric.  CRANIAL NERVES:  Normal conjugate, extra-ocular eye movements in all directions of gaze.  No ptosis.  Face is symmetric.   MOTOR:  Motor strength is 5/5 in all extremities.  No atrophy, fasciculations or abnormal movements.  No pronator drift.  Tone is normal.    COORDINATION/GAIT:  Gait narrow based and stable.   Data: Lab Results  Component Value Date   FERRITIN 268 12/19/2023     Thank you for  allowing me to participate in patient's care.  If I can answer any additional questions, I would be pleased to do so.    Sincerely,    Marvelle Span K. Tobie, DO

## 2024-08-07 NOTE — Patient Instructions (Signed)
 Take mirapex  0.25mg  at 4pm and bedtime.  OK to take extra 0.25mg  for severe symptoms. Alternatively, he can try carbidopa -levadopa prn

## 2024-08-13 ENCOUNTER — Telehealth: Payer: Self-pay | Admitting: Cardiology

## 2024-08-13 NOTE — Telephone Encounter (Signed)
I will forward to Dr.Branch to result 

## 2024-08-13 NOTE — Telephone Encounter (Signed)
Patient called to follow-up on his heart monitor results.

## 2024-08-16 ENCOUNTER — Ambulatory Visit

## 2024-08-17 NOTE — Telephone Encounter (Signed)
 The patient has been notified of the result and verbalized understanding.  All questions (if any) were answered. Rosina JAYSON Cornea, CMA 08/17/2024 2:50 PM    Patient prefers to wait on medical therapy at this time and will contact office if he changes his mind.

## 2024-08-17 NOTE — Telephone Encounter (Signed)
   Awaiting the official read by Dr. Alvan but the preliminary report shows predominantly normal sinus rhythm with an average heart rate of 73 bpm. He did have 1 episode of SVT (tachycardia) which only lasted for 6 beats and rare premature beats from the top and bottom chambers of the heart but less than 1% burden. Overall, a reassuring report!  At the time of his last office visit, he preferred to avoid medical therapy. If he is still having palpitations, could take Lopressor 12.5 mg as needed for episodes.   Signed, Laymon CHRISTELLA Qua, PA-C 08/17/2024, 7:36 AM

## 2024-08-29 ENCOUNTER — Encounter (INDEPENDENT_AMBULATORY_CARE_PROVIDER_SITE_OTHER): Payer: Self-pay | Admitting: Gastroenterology

## 2024-09-03 ENCOUNTER — Encounter: Payer: Self-pay | Admitting: Orthopedic Surgery

## 2024-09-03 ENCOUNTER — Ambulatory Visit: Admitting: Orthopedic Surgery

## 2024-09-03 DIAGNOSIS — M170 Bilateral primary osteoarthritis of knee: Secondary | ICD-10-CM

## 2024-09-03 NOTE — Progress Notes (Addendum)
" ° °  Procedure Note  Patient: Ryan Fuller             Date of Birth: 1966/02/07           MRN: [id number]             Visit Date: 09/03/2024  Procedures: Visit Diagnoses:  1. Primary osteoarthritis of both knees     Large Joint Inj: bilateral knee on 09/03/2024 2:50 PM Indications: pain, joint swelling and diagnostic evaluation Details: 18 G 1.5 in needle, superolateral approach  Arthrogram: No  Medications (Right): 88 mg Hyaluronan 88 MG/4ML Medications (Left): 88 mg Hyaluronan 88 MG/4ML Outcome: tolerated well, no immediate complications Procedure, treatment alternatives, risks and benefits explained, specific risks discussed. Consent was given by the patient. Immediately prior to procedure a time out was called to verify the correct patient, procedure, equipment, support staff and site/side marked as required. Patient was prepped and draped in the usual sterile fashion.     Lot #[id number] [name] may come back in 3 months for bilateral cortisone injections.  Will see how he is doing clinically.  He may be 1 to get on the cadence of alternating gel and cortisone injections.  "

## 2024-09-10 ENCOUNTER — Ambulatory Visit (INDEPENDENT_AMBULATORY_CARE_PROVIDER_SITE_OTHER): Payer: Medicare HMO | Admitting: Gastroenterology

## 2024-09-10 ENCOUNTER — Encounter (INDEPENDENT_AMBULATORY_CARE_PROVIDER_SITE_OTHER): Payer: Self-pay | Admitting: Gastroenterology

## 2024-09-10 VITALS — BP 128/80 | HR 76 | Temp 98.4°F | Ht 72.0 in | Wt 238.6 lb

## 2024-09-10 DIAGNOSIS — K219 Gastro-esophageal reflux disease without esophagitis: Secondary | ICD-10-CM | POA: Diagnosis not present

## 2024-09-10 DIAGNOSIS — R1310 Dysphagia, unspecified: Secondary | ICD-10-CM

## 2024-09-10 DIAGNOSIS — Z860101 Personal history of adenomatous and serrated colon polyps: Secondary | ICD-10-CM | POA: Diagnosis not present

## 2024-09-10 NOTE — Patient Instructions (Signed)
 Continue omeprazole  40mg  daily Continue famotidine  20mg  nightly Take small bites, chew thoroughly, take sips of liquids between bites Let me know if you wish to pursue upper endoscopy or if swallowing is worsening  Follow up 6 months  It was a pleasure to see you today. I want to create trusting relationships with patients and provide genuine, compassionate, and quality care. I truly value your feedback! please be on the lookout for a survey regarding your visit with me today. I appreciate your input about our visit and your time in completing this!    Quayshawn Nin L. Juniper Cobey, MSN, APRN, AGNP-C Adult-Gerontology Nurse Practitioner [facility]

## 2024-09-10 NOTE — Progress Notes (Addendum)
 Referring Provider: Marvine Rush, MD Primary Care Physician:  Marvine Rush, MD Primary GI Physician:   Chief Complaint  Patient presents with   Follow-up    Patient here today for a follow up on Gerd. Patient says he has a feeling of food being stuck in the epigastric region when he swallows. He is taking omeprazole  40 mg Q am, and Famotidine  20 mg once per day.      HPI:   Refael V Mccaughey is a 58 y.o. male with past medical history of GERD, kidney stones, multiple cervical surgeries   Patient presenting today for:  Follow up of GERD, dysphagia  Last seen may, at that time doing well, GERD well controlled on omeprazole  and famotidine , no dysphagia. Some constipation after recent back surgery  Recommended to increase water , decrease metamucil 1 scoop nightly, continue miralax, increase to TID, continue omeprazole  40mg  /famotidine  20mg  daily  Present:  He notes GERD is doing well. Very rare breakthrough with some regurgitation only if bending over after eating but no other issues. He does endorse more dysphagia recently with foods slower to pass. He denies any odynophagia, usually can get food to pass with drinking liquids. No recent new meds, steroids or antibiotics. No nausea or vomiting. Constipation has resolved. Patient denies melena, hematochezia, nausea, vomiting, diarrhea, early satiety or weight loss.   Last Colonoscopy:10/2023 - Two 3 to 8 mm polyps in the transverse colon and                            in the ascending colon, removed with a cold snare.                            Resected and retrieved.                           - One 2 mm polyp in the transverse colon, removed                            with a cold biopsy forceps. Resected and retrieved.                           - One 3 mm polyp in the sigmoid colon, removed with                            a cold snare. Resected and retrieved.                           - Diverticulosis in the sigmoid colon and in the                             descending colon.                           - Non-bleeding internal hemorrhoids.   A. COLON, ASCENDING, TRANSVERSE, POLYPECTOMY:  - Tubular adenoma(s) without high-grade dysplasia or malignancy  - Sessile serrated polyp(s) without cytologic dysplasia   B. COLON, SIGMOID, POLYPECTOMY:  - Tubular adenoma without high-grade dysplasia or malignancy    Last EGD:  05/2023- No endoscopic esophageal abnormality to explain  patient's dysphagia. Esophagus dilated. Dilated.                            Biopsied.                           - 1 cm hiatal hernia.                           - Normal stomach.                           - Normal examined duodenum. Barium esophagram 2020 -  prominent cricopharyngeus but barium pill but barium pill passed from oral cavity stomach without any delay     Repeat Colonoscopy due in 2029 [facility] Weights   09/10/24 1439  Weight: 238 lb 9.6 oz (108.2 kg)     Past Medical History:  Diagnosis Date   Dysphagia 08/10/2017   Gout    Gout 08/10/2017   Iritis    dx by Dr. Darroll   Kidney stones     Past Surgical History:  Procedure Laterality Date   BACK SURGERY  02/2024   BIOPSY  11/13/2018   Procedure: BIOPSY;  Surgeon: Golda Claudis PENNER, MD;  [facility];  Service: Endoscopy;;  terminal ileum colon   BIOPSY  05/20/2023   Procedure: BIOPSY;  Surgeon: Eartha Angelia Sieving, MD;  [facility];  Service: Gastroenterology;;   BIOPSY  10/19/2023   Procedure: BIOPSY;  Surgeon: Eartha Angelia Sieving, MD;  [facility];  Service: Gastroenterology;;   COLONOSCOPY N/A 03/01/2013   Procedure: COLONOSCOPY;  Surgeon: Claudis PENNER Golda, MD;  [facility];  Service: Endoscopy;  Laterality: N/A;  100   COLONOSCOPY N/A 11/13/2018   Procedure: COLONOSCOPY;  Surgeon: Golda Claudis PENNER, MD;  [facility];  Service: Endoscopy;  Laterality: N/A;  1015am   COLONOSCOPY WITH  PROPOFOL  N/A 10/19/2023   Procedure: COLONOSCOPY WITH PROPOFOL ;  Surgeon: Eartha Angelia Sieving, MD;  [facility];  Service: Gastroenterology;  Laterality: N/A;  8:15am;asa 2   EGD/ED     years ago   ESOPHAGEAL DILATION N/A 08/31/2017   Procedure: ESOPHAGEAL DILATION;  Surgeon: Golda Claudis PENNER, MD;  [facility];  Service: Endoscopy;  Laterality: N/A;   ESOPHAGEAL DILATION N/A 05/20/2023   Procedure: ESOPHAGEAL DILATION;  Surgeon: Eartha Angelia Sieving, MD;  [facility];  Service: Gastroenterology;  Laterality: N/A;  10:15am;asa 2   ESOPHAGOGASTRODUODENOSCOPY N/A 08/31/2017   Procedure: ESOPHAGOGASTRODUODENOSCOPY (EGD);  Surgeon: Golda Claudis PENNER, MD;  [facility];  Service: Endoscopy;  Laterality: N/A;  3:10   ESOPHAGOGASTRODUODENOSCOPY (EGD) WITH PROPOFOL  N/A 05/20/2023   Procedure: ESOPHAGOGASTRODUODENOSCOPY (EGD) WITH PROPOFOL ;  Surgeon: Eartha Angelia Sieving, MD;  [facility];  Service: Gastroenterology;  Laterality: N/A;  10:15am;asa 2   NECK SURGERY     x2   POLYPECTOMY  11/13/2018   Procedure: POLYPECTOMY;  Surgeon: Golda Claudis PENNER, MD;  [facility];  Service: Endoscopy;;  colon   POLYPECTOMY  10/19/2023   Procedure: POLYPECTOMY;  Surgeon: Eartha Angelia Sieving, MD;  [facility];  Service: Gastroenterology;;   ROTATOR CUFF REPAIR Left    Rotator cuff surgery Right     Current Outpatient Medications  Medication Sig Dispense Refill   allopurinol (  ZYLOPRIM) 100 MG tablet Take 100 mg by mouth 2 (two) times daily.      famotidine  (PEPCID ) 20 MG tablet TAKE ONE TABLET BY MOUTH ONCE DAILY. 90 tablet 1   omeprazole  (PRILOSEC) 40 MG capsule Take 1 capsule (40 mg total) by mouth daily. 90 capsule 3   polyethylene glycol (MIRALAX / GLYCOLAX) packet Take 34 g by mouth daily. w/Coffee     pramipexole  (MIRAPEX ) 0.5 MG tablet Take half tablet at 4pm and 1 tablet at bedtime. 45 tablet 5   No  current facility-administered medications for this visit.    Allergies as of 09/10/2024 - Review Complete 09/10/2024  Allergen Reaction Noted   Phenergan [promethazine hcl] Anaphylaxis and Other (See Comments) 02/20/2013   Colchicine Other (See Comments) 02/20/2013   Penicillins Other (See Comments) 09/25/2014   Vancomycin Swelling and Other (See Comments) 05/27/2016    Social History   Socioeconomic History   Marital status: Married    Spouse name: Not on file   Number of children: Not on file   Years of education: Not on file   Highest education level: Not on file  Occupational History   Not on file  Tobacco Use   Smoking status: Never    Passive exposure: Past   Smokeless tobacco: Never  Vaping Use   Vaping status: Never Used  Substance and Sexual Activity   Alcohol use: No   Drug use: No   Sexual activity: Not on file  Other Topics Concern   Not on file  Social History Narrative   Are you right handed or left handed? Right handed   Are you currently employed ? No   What is your current occupation?   Do you live at home alone? No   Who lives with you? With wife    What type of home do you live in: 1 story or 2 story? Lives in a one story home       Social Drivers of Health   Financial Resource Strain: Not on file  Food Insecurity: Not on file  Transportation Needs: Not on file  Physical Activity: Not on file  Stress: Not on file  Social Connections: Not on file    Review of systems General: negative for malaise, night sweats, fever, chills, weight loss Neck: Negative for lumps, goiter, pain and significant neck swelling Resp: Negative for cough, wheezing, dyspnea at rest CV: Negative for chest pain, leg swelling, palpitations, orthopnea GI: denies melena, hematochezia, nausea, vomiting, diarrhea, constipation, odyonophagia, early satiety or unintentional weight loss. +dysphagia  MSK: Negative for joint pain or swelling, back pain, and muscle pain. Derm:  Negative for itching or rash Psych: Denies depression, anxiety, memory loss, confusion. No homicidal or suicidal ideation.  Heme: Negative for prolonged bleeding, bruising easily, and swollen nodes. Endocrine: Negative for cold or heat intolerance, polyuria, polydipsia and goiter. Neuro: negative for tremor, gait imbalance, syncope and seizures. The remainder of the review of systems is noncontributory.  Physical Exam: BP 128/80 (BP Location: Left Arm, Patient Position: Sitting, Cuff Size: Normal)   Pulse 76   Temp 98.4 F (36.9 C) (Temporal)   Ht 6' (1.829 m)   Wt 238 lb 9.6 oz (108.2 kg)   BMI 32.36 kg/m  General:   Alert and oriented. No distress noted. Pleasant and cooperative.  Head:  Normocephalic and atraumatic. Eyes:  Conjuctiva clear without scleral icterus. Mouth:  Oral mucosa pink and moist. Good dentition. No lesions. Heart: Normal rate and rhythm, s1 and s2  heart sounds present.  Lungs: Clear lung sounds in all lobes. Respirations equal and unlabored. Abdomen:  +BS, soft, non-tender and non-distended. No rebound or guarding. No HSM or masses noted. Neurologic:  Alert and  oriented x4 Psych:  Alert and cooperative. Normal mood and affect.  Invalid input(s): 6 MONTHS   ASSESSMENT: Diem V Filley is a 58 y.o. male presenting today for follow up of GERD and dysphagia  GERD/dysphagia: well managed with omeprazole  40mg  daily/famotidine  20mg  at bedtime. Very rare breakthrough only if he bends over after eating and will have regurgitation. He does endorse dysphagia recently with foods slower to pass, sensation of sitting in his chest, usually can get them to pass drinking liquids. No odynophagia, no new possibly offending meds recently. We discussed EGD with dilation as he has history of prominent cricopharyngeal muscle which dilation has helped with in the past. At this time, he prefers to hold off, he will make me aware if symptoms worsen or he wishes to pursue EGD with  dilation.    PLAN:  -Continue omeprazole  40mg  daily -Continue famotidine  20mg  nightly -Take small bites, chew thoroughly, take sips of liquids between bites - pt to Let me know if he wishes topursue upper endoscopy or if swallowing is worsening  All questions were answered, patient verbalized understanding and is in agreement with plan as outlined above.    Follow Up: 6 months   Dimitris Shanahan L. Mariette, MSN, APRN, AGNP-C Adult-Gerontology Nurse Practitioner [facility]  I have reviewed the note and agree with the APP's assessment as described in this progress note  Toribio Fortune, MD Gastroenterology and Hepatology [facility] Gastroenterology

## 2024-09-17 ENCOUNTER — Encounter: Payer: Self-pay | Admitting: Radiology

## 2024-10-14 DIAGNOSIS — R002 Palpitations: Secondary | ICD-10-CM

## 2024-10-15 ENCOUNTER — Ambulatory Visit: Payer: Self-pay | Admitting: Student

## 2024-11-02 MED ORDER — HYALURONAN 88 MG/4ML IX SOSY
88.0000 mg | PREFILLED_SYRINGE | INTRA_ARTICULAR | Status: AC | PRN
  Administered 2024-09-03: 88 mg via INTRA_ARTICULAR

## 2024-12-07 ENCOUNTER — Other Ambulatory Visit: Payer: Self-pay

## 2024-12-07 ENCOUNTER — Ambulatory Visit: Admitting: Orthopedic Surgery

## 2024-12-07 DIAGNOSIS — M25521 Pain in right elbow: Secondary | ICD-10-CM

## 2024-12-07 DIAGNOSIS — M170 Bilateral primary osteoarthritis of knee: Secondary | ICD-10-CM

## 2024-12-08 ENCOUNTER — Encounter: Payer: Self-pay | Admitting: Orthopedic Surgery

## 2024-12-08 MED ORDER — BUPIVACAINE HCL 0.25 % IJ SOLN
4.0000 mL | INTRAMUSCULAR | Status: AC | PRN
  Administered 2024-12-07: 4 mL via INTRA_ARTICULAR

## 2024-12-08 MED ORDER — TRIAMCINOLONE ACETONIDE 40 MG/ML IJ SUSP
40.0000 mg | INTRAMUSCULAR | Status: AC | PRN
  Administered 2024-12-07: 40 mg via INTRA_ARTICULAR

## 2024-12-08 MED ORDER — LIDOCAINE HCL 1 % IJ SOLN
5.0000 mL | INTRAMUSCULAR | Status: AC | PRN
  Administered 2024-12-07: 5 mL

## 2024-12-08 NOTE — Progress Notes (Signed)
 "  Office Visit Note   Patient: Ryan Fuller           Date of Birth: 24-Nov-1965           MRN: [id number] Visit Date: 12/07/2024 Requested by: Marvine Rush, MD [address] [place],  [place] [address] PCP: Marvine Rush, MD  Subjective: Chief Complaint  Patient presents with   Right Knee - Pain   Left Knee - Pain   Right Elbow - Injury    HPI: Ryan Fuller is a 59 y.o. male who presents to the office reporting right elbow pain.  He actually banged it against a hard object the day before clinic visit.  He denies any pain with pronation supination of the arm.  He also has bilateral knee arthritis and presents for planned knee injections today.  He did have bilateral knee gel injections in October which did not help.  He does have known arthritis in both knees.  Denies any numbness and tingling in the right hand..                ROS: All systems reviewed are negative as they relate to the chief complaint within the history of present illness.  Patient denies fevers or chills.  Assessment & Plan: Visit Diagnoses:  1. Pain in right elbow     Plan: Impression is right elbow pain with no evidence of radial head fracture and intact triceps and biceps tendon.  Plan is activity as tolerated with right elbow.  Bilateral knee injections also performed today.  We could repeat that once maybe twice more this year.  He may need knee replacement at sometime in the future but he is very active.  Follow-Up Instructions: No follow-ups on file.   Orders:  Orders Placed This Encounter  Procedures   XR Elbow Complete Right (3+View)   No orders of the defined types were placed in this encounter.     Procedures: Large Joint Inj: bilateral knee on 12/07/2024 11:14 AM Indications: diagnostic evaluation, joint swelling and pain Details: 18 G 1.5 in needle, superolateral approach  Arthrogram: No  Medications (Right): 5 mL lidocaine  1 %; 4 mL bupivacaine  0.25 %; 40 mg triamcinolone  acetonide 40  MG/ML Medications (Left): 5 mL lidocaine  1 %; 4 mL bupivacaine  0.25 %; 40 mg triamcinolone  acetonide 40 MG/ML Outcome: tolerated well, no immediate complications Procedure, treatment alternatives, risks and benefits explained, specific risks discussed. Consent was given by the patient. Immediately prior to procedure a time out was called to verify the correct patient, procedure, equipment, support staff and site/side marked as required. Patient was prepped and draped in the usual sterile fashion.       Clinical Data: No additional findings.  Objective: Vital Signs: There were no vitals taken for this visit.  Physical Exam:  Constitutional: Patient appears well-developed HEENT:  Head: Normocephalic Eyes:EOM are normal Neck: Normal range of motion Cardiovascular: Normal rate Pulmonary/chest: Effort normal Neurologic: Patient is alert Skin: Skin is warm Psychiatric: Patient has normal mood and affect  Ortho Exam: Ortho exam demonstrates slight flexion contracture in both knees with no effusion.  Extensor mechanism intact.  Flexion to about 110 bilaterally.  Has medial greater than lateral joint line tenderness.  Right elbow has full active and passive range of motion in flexion extension along with pronation and supination.  Biceps triceps tendon nontender and intact.  No tenderness over the radial head with pronation and supination.  Specialty Comments:  03/17/2022 EMG/NCS Impression:  The above electrodiagnostic study is ABNORMAL and reveals evidence of a moderate Bilateral median nerve entrapment at the wrist (carpal tunnel syndrome) affecting sensory and motor components.    There is no significant electrodiagnostic evidence of any other focal nerve entrapment, brachial plexopathy or cervical radiculopathy.    Recommendations: 1.  Follow-up with referring physician. 2.  Continue current management of symptoms. 3.  Continue use of resting splint at night-time and as needed during  the day. 4.  Suggest surgical evaluation.  Prentice MARLA Masters, MD ------- MRI CERVICAL SPINE WITHOUT CONTRAST   TECHNIQUE: Multiplanar, multisequence MR imaging of the cervical spine was performed. No intravenous contrast was administered.   COMPARISON:  Previous MRI from 10/16/2021.   FINDINGS: Alignment: Straightening of the normal cervical lordosis. No significant listhesis.   Vertebrae: Postoperative changes from prior fusion at C4 through C7, with anterior plate screw fixation in place at C6-7. Vertebral body height maintained without acute or chronic fracture. Bone marrow signal intensity heterogeneous but overall within normal limits. No worrisome osseous lesions. No abnormal marrow edema.   Cord: Normal signal and morphology.   Posterior Fossa, vertebral arteries, paraspinal tissues: Visualized brain and posterior fossa within normal limits. Craniocervical junction normal. Mild soft tissue edema adjacent to the C7 spinous process, most commonly seen with bursitis (series 7, image 10). Paraspinous soft tissues demonstrate no other acute finding. Normal flow voids seen within the vertebral arteries bilaterally.   Disc levels:   C2-C3: Normal interspace. Mild bilateral facet hypertrophy. Small extraforaminal synovial cyst again noted on the left, stable (series 8, image 6). No spinal stenosis. Foramina remain patent.   C3-C4: Mild disc bulge with uncovertebral spurring. Mild bilateral facet and ligament flavum hypertrophy. No spinal stenosis. Mild bilateral foraminal narrowing, stable.   C4-C5: Prior fusion. No residual spinal stenosis. Foramina remain patent.   C5-C6: Prior fusion. No residual spinal stenosis. Right worse than left uncovertebral and facet hypertrophy with residual moderate right C6 foraminal narrowing. Left neural foramen remains patent.   C6-C7: Prior fusion. No residual spinal stenosis. Uncovertebral spurring with residual mild left C7 foraminal  stenosis. Right neural foramen remains patent.   C7-T1: Small central to right paracentral disc protrusion with annular fissure indents the ventral thecal sac (series 8, image 33). Mild facet hypertrophy. No significant spinal stenosis. Foramina remain patent.   Note made of mild noncompressive disc bulging at the T2-3 level without stenosis.   IMPRESSION: 1. Prior fusion at C4 through C7 without residual spinal stenosis. Residual moderate right C6 and mild left C7 foraminal narrowing related to uncovertebral and facet disease. Overall appearance is stable. 2. Small central to right paracentral disc protrusion at C7-T1 without significant stenosis. This is slightly decreased in size from prior. 3. Mild bilateral foraminal narrowing at C3-4 related to disc bulge and uncovertebral disease, stable. 4. Mild soft tissue edema about the C7 spinous process, most commonly seen in the setting of bursitis.     Electronically Signed   By: Morene Hoard M.D.   On: 11/08/2022 02:24  Imaging: No results found.   PMFS History: Patient Active Problem List   Diagnosis Date Noted   History of colonic polyps 10/19/2023   Chest pain 05/20/2023   DDD (degenerative disc disease), cervical 02/04/2022   DDD (degenerative disc disease), lumbar 02/04/2022   Primary osteoarthritis of both hands 02/04/2022   Arthritis of both acromioclavicular joints 02/04/2022   GERD (gastroesophageal reflux disease) 09/02/2021   Sigmoid diverticulitis 10/01/2019   Family hx of colon  cancer 10/10/2018   Rectal bleeding 10/10/2018   History of colon polyps 10/10/2018   Dysphagia 08/10/2017   Gout 08/10/2017   Constipation 02/21/2013   Past Medical History:  Diagnosis Date   Dysphagia 08/10/2017   Gout    Gout 08/10/2017   Iritis    dx by Dr. Darroll   Kidney stones     Family History  Problem Relation Age of Onset   Dementia Mother    Kidney cancer Father    Kidney failure Brother     Congestive Heart Failure Brother    Healthy Brother    Heart Problems Son    Healthy Son    Healthy Daughter     Past Surgical History:  Procedure Laterality Date   BACK SURGERY  02/2024   BIOPSY  11/13/2018   Procedure: BIOPSY;  Surgeon: Golda Claudis PENNER, MD;  [facility];  Service: Endoscopy;;  terminal ileum colon   BIOPSY  05/20/2023   Procedure: BIOPSY;  Surgeon: Eartha Angelia Sieving, MD;  [facility];  Service: Gastroenterology;;   BIOPSY  10/19/2023   Procedure: BIOPSY;  Surgeon: Eartha Angelia Sieving, MD;  [facility];  Service: Gastroenterology;;   COLONOSCOPY N/A 03/01/2013   Procedure: COLONOSCOPY;  Surgeon: Claudis PENNER Golda, MD;  [facility];  Service: Endoscopy;  Laterality: N/A;  100   COLONOSCOPY N/A 11/13/2018   Procedure: COLONOSCOPY;  Surgeon: Golda Claudis PENNER, MD;  [facility];  Service: Endoscopy;  Laterality: N/A;  1015am   COLONOSCOPY WITH PROPOFOL  N/A 10/19/2023   Procedure: COLONOSCOPY WITH PROPOFOL ;  Surgeon: Eartha Angelia Sieving, MD;  [facility];  Service: Gastroenterology;  Laterality: N/A;  8:15am;asa 2   EGD/ED     years ago   ESOPHAGEAL DILATION N/A 08/31/2017   Procedure: ESOPHAGEAL DILATION;  Surgeon: Golda Claudis PENNER, MD;  [facility];  Service: Endoscopy;  Laterality: N/A;   ESOPHAGEAL DILATION N/A 05/20/2023   Procedure: ESOPHAGEAL DILATION;  Surgeon: Eartha Angelia Sieving, MD;  [facility];  Service: Gastroenterology;  Laterality: N/A;  10:15am;asa 2   ESOPHAGOGASTRODUODENOSCOPY N/A 08/31/2017   Procedure: ESOPHAGOGASTRODUODENOSCOPY (EGD);  Surgeon: Golda Claudis PENNER, MD;  [facility];  Service: Endoscopy;  Laterality: N/A;  3:10   ESOPHAGOGASTRODUODENOSCOPY (EGD) WITH PROPOFOL  N/A 05/20/2023   Procedure: ESOPHAGOGASTRODUODENOSCOPY (EGD) WITH PROPOFOL ;  Surgeon: Eartha Angelia Sieving, MD;  [facility];  Service:  Gastroenterology;  Laterality: N/A;  10:15am;asa 2   NECK SURGERY     x2   POLYPECTOMY  11/13/2018   Procedure: POLYPECTOMY;  Surgeon: Golda Claudis PENNER, MD;  [facility];  Service: Endoscopy;;  colon   POLYPECTOMY  10/19/2023   Procedure: POLYPECTOMY;  Surgeon: Eartha Angelia Sieving, MD;  [facility];  Service: Gastroenterology;;   ROTATOR CUFF REPAIR Left    Rotator cuff surgery Right    Social History   Occupational History   Not on file  Tobacco Use   Smoking status: Never    Passive exposure: Past   Smokeless tobacco: Never  Vaping Use   Vaping status: Never Used  Substance and Sexual Activity   Alcohol use: No   Drug use: No   Sexual activity: Not on file        "

## 2024-12-12 ENCOUNTER — Telehealth (INDEPENDENT_AMBULATORY_CARE_PROVIDER_SITE_OTHER): Admitting: Neurology

## 2024-12-12 DIAGNOSIS — G2581 Restless legs syndrome: Secondary | ICD-10-CM | POA: Diagnosis not present

## 2024-12-12 DIAGNOSIS — G4761 Periodic limb movement disorder: Secondary | ICD-10-CM

## 2024-12-12 MED ORDER — PREGABALIN 50 MG PO CAPS: ORAL_CAPSULE | ORAL | 5 refills | Status: AC

## 2024-12-12 MED ORDER — PRAMIPEXOLE DIHYDROCHLORIDE 0.5 MG PO TABS: 0.5000 mg | ORAL_TABLET | Freq: Every day | ORAL | 3 refills | Status: DC

## 2024-12-12 MED ORDER — CARBIDOPA-LEVODOPA 25-100 MG PO TABS: ORAL_TABLET | ORAL | 2 refills | Status: AC

## 2024-12-12 NOTE — Addendum Note (Signed)
 Addended by: Onyx Schirmer K on: 12/12/2024 10:26 AM   Modules accepted: Level of Service

## 2024-12-12 NOTE — Progress Notes (Signed)
" ° °  Virtual Visit via Video Note The purpose of this virtual visit is to provide medical care while limiting exposure to the novel coronavirus.    Consent was obtained for video visit:  Yes.   Answered questions that patient had about telehealth interaction:  Yes.   I discussed the limitations, risks, security and privacy concerns of performing an evaluation and management service by telemedicine. I also discussed with the patient that there may be a patient responsible charge related to this service. The patient expressed understanding and agreed to proceed.  Pt location: Home Physician Location: office Name of referring provider:  Marvine Rush, MD I connected with Ryan Fuller at patients initiation/request on 12/12/2024 at 10:30 AM EST by video enabled telemedicine application and verified that I am speaking with the correct person using two identifiers. Pt MRN:  [id number] Pt DOB:  08/17/1966 Video Participants:  Ryan Fuller   History of Present Illness: This is a 59 y.o. male returning for follow-up of restless leg syndrome and periodic limb movement disorder.  He was taking mirapex  0.25mg  at 5pm and 0.5mg  at bedtime, however developed worsening symptoms in the evening, so increased mirapex  to 0.5mg  at 5pm.  This did help his discomfort, however he continues to have limb movements and jerking about 1-2 times per week.  He had not tried taking carbidopa -levadopa.  He endorses having sleepiness and sexual side effects and would like to consider alternative options.   Observations/Objective:   There were no vitals filed for this visit. Patient is awake, alert, and appears comfortable.  Oriented x 4.   Extraocular muscles are intact. No ptosis.  Face is symmetric.  Speech is not dysarthric. Antigravity in all extremities.  Gait appears normal.   Assessment and Plan:  Restless leg syndrome.  Previously tried:  ropinirole, rotigotine, gabapentin  Periodic limb movement disorder  - Stop  mirapex  0.5mg  at 5pm and start Lyrica  50mg  at 5p - Continue mirapex  0.5mg  at bedtime - OK to use carbidopa -levadopa as needed for severe symptoms   Follow Up Instructions:   I discussed the assessment and treatment plan with the patient. The patient was provided an opportunity to ask questions and all were answered. The patient agreed with the plan and demonstrated an understanding of the instructions.   The patient was advised to call back or seek an in-person evaluation if the symptoms worsen or if the condition fails to improve as anticipated.  Follow-up in 4 months   Ryan MARLA Blanch, DO  "

## 2024-12-17 ENCOUNTER — Other Ambulatory Visit (HOSPITAL_COMMUNITY): Payer: Self-pay | Admitting: Family Medicine

## 2024-12-17 DIAGNOSIS — E785 Hyperlipidemia, unspecified: Secondary | ICD-10-CM

## 2024-12-19 ENCOUNTER — Telehealth: Payer: Self-pay | Admitting: Neurology

## 2024-12-19 NOTE — Telephone Encounter (Signed)
 Called patient and he stated that he took Lyrica  50 mg once and it made him feel shaky and bad. So he is no longer taking it. Patient stated that he is taking Mirapex  0.5 mg at 5 pm and 0.5 mg at bedtime. Patient stated he has not taken the carbidopa  levodopa . Patient wants to know if there is anything else that can work for him since he cannot take the Lyrica . Patient stated that because he has been taking more of the Mirapex  he is going to run out soon. Patient aware I will send his message to Dr. Tobie and give him a call back.

## 2024-12-19 NOTE — Telephone Encounter (Signed)
 Pt states Rx provided is not helping making him fell worse, need to speak about making Rx change

## 2024-12-20 MED ORDER — PRAMIPEXOLE DIHYDROCHLORIDE 0.5 MG PO TABS: ORAL_TABLET | ORAL | 3 refills | Status: AC

## 2024-12-20 NOTE — Telephone Encounter (Signed)
 We can try low dose benzodiazepine medication, such as clonazepam 0.5mg  - this may make him sleepy, so I recommend starting with half tablet at 5pm.  This is a controlled medication, so refills are limited to 30 day supply.

## 2024-12-20 NOTE — Telephone Encounter (Signed)
 Yes, if he is getting relief at this dose, ok to take mirapex  0.5mg  at 5p and 0.5mg  at bedtime.  Due to potential worsening of symptoms on this medication, we would not be able to increase the dose from here.  Updated Rx has been sent.  Thanks.

## 2024-12-20 NOTE — Addendum Note (Signed)
 Addended by: Danaja Lasota K on: 12/20/2024 02:06 PM   Modules accepted: Orders

## 2024-12-20 NOTE — Telephone Encounter (Signed)
 Called patient and informed him that we can try a low dose benzodiazepine such as clonazepam 0.5 mg and it may make him sleepy, so Dr. Tobie recommends starting with a half tablet at 5 pm. Also informed patient that this is a controlled medication so refills are limited to a 30 day supply.  Patient asked if he can just stay on what he is on. He stated that when he increased the Mirapex  0.5 mg at 5 pm and 0.5 mg at bedtime it helped some. He is wanting to know if he can stay on that and if so he will need refills sent in.   Informed patient I will send message to ask Dr. Tobie and if there are any issues I will contact him back.   Patient verbalized understanding and had no further questions or concerns.

## 2024-12-21 NOTE — Telephone Encounter (Signed)
 Called patient and informed him of below per Dr. Tobie:  Yes, if he is getting relief at this dose, ok to take mirapex  0.5mg  at 5p and 0.5mg  at bedtime.  Due to potential worsening of symptoms on this medication, we would not be able to increase the dose from here.  Updated Rx has been sent.  Thanks.    Patient verbalized understanding and is aware to not increase dose anymore. Patient verbalized understanding and had no further questions or concerns.

## 2025-02-01 ENCOUNTER — Ambulatory Visit (HOSPITAL_COMMUNITY)

## 2025-03-15 ENCOUNTER — Ambulatory Visit: Admitting: Urology

## 2025-04-16 ENCOUNTER — Ambulatory Visit: Payer: Self-pay | Admitting: Neurology
